# Patient Record
Sex: Male | Born: 1935
Health system: Southern US, Community
[De-identification: ages and names within clinical notes are randomized; demographics above are authoritative.]

## PROBLEM LIST (undated history)

## (undated) DIAGNOSIS — K2980 Duodenitis without bleeding: Secondary | ICD-10-CM

## (undated) DIAGNOSIS — N2581 Secondary hyperparathyroidism of renal origin: Secondary | ICD-10-CM

## (undated) DIAGNOSIS — N183 Chronic kidney disease, stage 3 unspecified: Secondary | ICD-10-CM

## (undated) DIAGNOSIS — N529 Male erectile dysfunction, unspecified: Secondary | ICD-10-CM

## (undated) DIAGNOSIS — N189 Chronic kidney disease, unspecified: Secondary | ICD-10-CM

## (undated) DIAGNOSIS — N35919 Unspecified urethral stricture, male, unspecified site: Secondary | ICD-10-CM

## (undated) DIAGNOSIS — M1A9XX0 Chronic gout, unspecified, without tophus (tophi): Secondary | ICD-10-CM

## (undated) DIAGNOSIS — K449 Diaphragmatic hernia without obstruction or gangrene: Secondary | ICD-10-CM

## (undated) DIAGNOSIS — L600 Ingrowing nail: Secondary | ICD-10-CM

## (undated) DIAGNOSIS — Z9989 Dependence on other enabling machines and devices: Secondary | ICD-10-CM

## (undated) DIAGNOSIS — G4730 Sleep apnea, unspecified: Secondary | ICD-10-CM

## (undated) DIAGNOSIS — K2990 Gastroduodenitis, unspecified, without bleeding: Secondary | ICD-10-CM

## (undated) DIAGNOSIS — I350 Nonrheumatic aortic (valve) stenosis: Secondary | ICD-10-CM

## (undated) DIAGNOSIS — M1990 Unspecified osteoarthritis, unspecified site: Secondary | ICD-10-CM

## (undated) DIAGNOSIS — Z906 Acquired absence of other parts of urinary tract: Secondary | ICD-10-CM

## (undated) DIAGNOSIS — E559 Vitamin D deficiency, unspecified: Secondary | ICD-10-CM

## (undated) DIAGNOSIS — K862 Cyst of pancreas: Secondary | ICD-10-CM

## (undated) DIAGNOSIS — E785 Hyperlipidemia, unspecified: Secondary | ICD-10-CM

## (undated) DIAGNOSIS — N489 Disorder of penis, unspecified: Secondary | ICD-10-CM

## (undated) DIAGNOSIS — G4733 Obstructive sleep apnea (adult) (pediatric): Secondary | ICD-10-CM

## (undated) DIAGNOSIS — E119 Type 2 diabetes mellitus without complications: Secondary | ICD-10-CM

## (undated) DIAGNOSIS — I10 Essential (primary) hypertension: Secondary | ICD-10-CM

## (undated) DIAGNOSIS — D490 Neoplasm of unspecified behavior of digestive system: Secondary | ICD-10-CM

## (undated) DIAGNOSIS — N401 Enlarged prostate with lower urinary tract symptoms: Secondary | ICD-10-CM

## (undated) DIAGNOSIS — Z872 Personal history of diseases of the skin and subcutaneous tissue: Secondary | ICD-10-CM

## (undated) DIAGNOSIS — Z974 Presence of external hearing-aid: Secondary | ICD-10-CM

## (undated) DIAGNOSIS — D631 Anemia in chronic kidney disease: Secondary | ICD-10-CM

## (undated) DIAGNOSIS — N184 Chronic kidney disease, stage 4 (severe): Secondary | ICD-10-CM

## (undated) DIAGNOSIS — N138 Other obstructive and reflux uropathy: Secondary | ICD-10-CM

## (undated) DIAGNOSIS — C801 Malignant (primary) neoplasm, unspecified: Secondary | ICD-10-CM

## (undated) HISTORY — DX: Sleep apnea, unspecified: G47.30

## (undated) HISTORY — DX: Ingrowing nail: L60.0

## (undated) HISTORY — DX: Essential (primary) hypertension: I10

## (undated) HISTORY — DX: Diaphragmatic hernia without obstruction or gangrene: K44.9

## (undated) HISTORY — DX: Unspecified osteoarthritis, unspecified site: M19.90

## (undated) HISTORY — DX: Cyst of pancreas: K86.2

## (undated) HISTORY — DX: Duodenitis without bleeding: K29.80

---

## 2003-06-11 ENCOUNTER — Encounter: Payer: Self-pay | Admitting: Internal Medicine

## 2005-10-20 ENCOUNTER — Ambulatory Visit: Payer: Self-pay | Admitting: Internal Medicine

## 2006-04-18 ENCOUNTER — Ambulatory Visit: Payer: Self-pay | Admitting: Internal Medicine

## 2006-05-02 ENCOUNTER — Ambulatory Visit: Payer: Self-pay | Admitting: Gastroenterology

## 2006-06-05 ENCOUNTER — Encounter: Payer: Self-pay | Admitting: Internal Medicine

## 2006-06-05 ENCOUNTER — Ambulatory Visit: Payer: Self-pay | Admitting: Gastroenterology

## 2006-10-31 ENCOUNTER — Ambulatory Visit: Payer: Self-pay | Admitting: Internal Medicine

## 2006-10-31 LAB — CONVERTED CEMR LAB
ALT: 30 U/L
AST: 25 U/L
Albumin: 3.8 g/dL
Alkaline Phosphatase: 64 U/L
BUN: 28 mg/dL — ABNORMAL HIGH
Bilirubin, Direct: 0.1 mg/dL
CO2: 25 meq/L
Calcium: 10 mg/dL
Chloride: 109 meq/L
Chol/HDL Ratio, serum: 4.4
Cholesterol: 188 mg/dL
Creatinine, Ser: 1.9 mg/dL — ABNORMAL HIGH
GFR calc non Af Amer: 37 mL/min
Glomerular Filtration Rate, Af Am: 45 mL/min/{1.73_m2}
Glucose, Bld: 119 mg/dL — ABNORMAL HIGH
HDL: 43.1 mg/dL
Hgb A1c MFr Bld: 6.2 % — ABNORMAL HIGH
LDL Cholesterol: 128 mg/dL — ABNORMAL HIGH
PSA: 0.61 ng/mL
Potassium: 4.8 meq/L
Sodium: 141 meq/L
Total Bilirubin: 1 mg/dL
Total Protein: 7.6 g/dL
Triglyceride fasting, serum: 84 mg/dL
VLDL: 17 mg/dL

## 2006-11-25 HISTORY — PX: OTHER SURGICAL HISTORY: SHX169

## 2006-12-04 ENCOUNTER — Encounter: Admission: RE | Admit: 2006-12-04 | Discharge: 2006-12-04 | Payer: Self-pay | Admitting: Urology

## 2006-12-05 ENCOUNTER — Ambulatory Visit (HOSPITAL_BASED_OUTPATIENT_CLINIC_OR_DEPARTMENT_OTHER): Admission: RE | Admit: 2006-12-05 | Discharge: 2006-12-05 | Payer: Self-pay | Admitting: Urology

## 2006-12-05 ENCOUNTER — Encounter (INDEPENDENT_AMBULATORY_CARE_PROVIDER_SITE_OTHER): Payer: Self-pay | Admitting: *Deleted

## 2006-12-05 HISTORY — PX: OTHER SURGICAL HISTORY: SHX169

## 2007-03-06 ENCOUNTER — Ambulatory Visit: Payer: Self-pay | Admitting: Internal Medicine

## 2007-03-06 LAB — CONVERTED CEMR LAB
ALT: 25 units/L (ref 0–40)
AST: 20 units/L (ref 0–37)
Albumin: 3.7 g/dL (ref 3.5–5.2)
Alkaline Phosphatase: 58 units/L (ref 39–117)
BUN: 27 mg/dL — ABNORMAL HIGH (ref 6–23)
Bilirubin, Direct: 0.1 mg/dL (ref 0.0–0.3)
CO2: 27 meq/L (ref 19–32)
Calcium: 9.6 mg/dL (ref 8.4–10.5)
Chloride: 108 meq/L (ref 96–112)
Cholesterol: 203 mg/dL (ref 0–200)
Creatinine, Ser: 1.8 mg/dL — ABNORMAL HIGH (ref 0.4–1.5)
Direct LDL: 134.9 mg/dL
GFR calc Af Amer: 48 mL/min
GFR calc non Af Amer: 40 mL/min
Glucose, Bld: 127 mg/dL — ABNORMAL HIGH (ref 70–99)
HDL: 50 mg/dL (ref >39.0)
Hgb A1c MFr Bld: 5.7 % (ref 4.6–6.0)
Potassium: 4.8 meq/L (ref 3.5–5.1)
Sodium: 140 meq/L (ref 135–145)
Total Bilirubin: 0.8 mg/dL (ref 0.3–1.2)
Total CHOL/HDL Ratio: 4.1
Total Protein: 7.2 g/dL (ref 6.0–8.3)
Triglycerides: 114 mg/dL (ref 0–149)
VLDL: 23 mg/dL (ref 0–40)

## 2007-03-19 ENCOUNTER — Ambulatory Visit: Payer: Self-pay | Admitting: Internal Medicine

## 2007-03-29 ENCOUNTER — Ambulatory Visit (HOSPITAL_BASED_OUTPATIENT_CLINIC_OR_DEPARTMENT_OTHER): Admission: RE | Admit: 2007-03-29 | Discharge: 2007-03-29 | Payer: Self-pay | Admitting: Urology

## 2007-03-29 ENCOUNTER — Encounter (INDEPENDENT_AMBULATORY_CARE_PROVIDER_SITE_OTHER): Payer: Self-pay | Admitting: Specialist

## 2007-03-29 HISTORY — PX: CYSTOSCOPY WITH BIOPSY: SHX5122

## 2007-05-01 ENCOUNTER — Encounter (INDEPENDENT_AMBULATORY_CARE_PROVIDER_SITE_OTHER): Payer: Self-pay | Admitting: Specialist

## 2007-05-01 ENCOUNTER — Ambulatory Visit (HOSPITAL_BASED_OUTPATIENT_CLINIC_OR_DEPARTMENT_OTHER): Admission: RE | Admit: 2007-05-01 | Discharge: 2007-05-01 | Payer: Self-pay | Admitting: Urology

## 2007-05-01 HISTORY — PX: URETHRECTOMY: SHX1080

## 2007-07-03 DIAGNOSIS — G4730 Sleep apnea, unspecified: Secondary | ICD-10-CM | POA: Insufficient documentation

## 2007-07-03 DIAGNOSIS — E1159 Type 2 diabetes mellitus with other circulatory complications: Secondary | ICD-10-CM

## 2007-07-03 DIAGNOSIS — I152 Hypertension secondary to endocrine disorders: Secondary | ICD-10-CM | POA: Insufficient documentation

## 2007-07-03 DIAGNOSIS — I10 Essential (primary) hypertension: Secondary | ICD-10-CM | POA: Insufficient documentation

## 2007-07-03 DIAGNOSIS — M1990 Unspecified osteoarthritis, unspecified site: Secondary | ICD-10-CM | POA: Insufficient documentation

## 2007-08-10 ENCOUNTER — Ambulatory Visit: Payer: Self-pay | Admitting: Internal Medicine

## 2007-08-27 ENCOUNTER — Ambulatory Visit: Payer: Self-pay | Admitting: Internal Medicine

## 2007-08-27 DIAGNOSIS — E1169 Type 2 diabetes mellitus with other specified complication: Secondary | ICD-10-CM | POA: Insufficient documentation

## 2007-08-27 DIAGNOSIS — E785 Hyperlipidemia, unspecified: Secondary | ICD-10-CM

## 2007-08-27 LAB — CONVERTED CEMR LAB
ALT: 23 units/L (ref 0–53)
AST: 20 units/L (ref 0–37)
Albumin: 3.6 g/dL (ref 3.5–5.2)
Alkaline Phosphatase: 60 units/L (ref 39–117)
BUN: 23 mg/dL (ref 6–23)
Basophils Absolute: 0 10*3/uL (ref 0.0–0.1)
Basophils Relative: 0.1 % (ref 0.0–1.0)
Bilirubin, Direct: 0.1 mg/dL (ref 0.0–0.3)
CO2: 26 meq/L (ref 19–32)
Calcium: 9.3 mg/dL (ref 8.4–10.5)
Chloride: 113 meq/L — ABNORMAL HIGH (ref 96–112)
Cholesterol: 194 mg/dL (ref 0–200)
Creatinine, Ser: 1.8 mg/dL — ABNORMAL HIGH (ref 0.4–1.5)
Eosinophils Absolute: 0.2 10*3/uL (ref 0.0–0.6)
Eosinophils Relative: 3 % (ref 0.0–5.0)
GFR calc Af Amer: 48 mL/min
GFR calc non Af Amer: 40 mL/min
Glucose, Bld: 141 mg/dL — ABNORMAL HIGH (ref 70–99)
HCT: 44 % (ref 39.0–52.0)
HDL: 42.8 mg/dL (ref >39.0)
Hemoglobin: 15.1 g/dL (ref 13.0–17.0)
Hgb A1c MFr Bld: 6.2 % — ABNORMAL HIGH (ref 4.6–6.0)
LDL Cholesterol: 125 mg/dL — ABNORMAL HIGH (ref 0–99)
Lymphocytes Relative: 28.1 % (ref 12.0–46.0)
MCHC: 34.3 g/dL (ref 30.0–36.0)
MCV: 91.1 fL (ref 78.0–100.0)
Monocytes Absolute: 0.6 10*3/uL (ref 0.2–0.7)
Monocytes Relative: 10.6 % (ref 3.0–11.0)
Neutro Abs: 2.9 10*3/uL (ref 1.4–7.7)
Neutrophils Relative %: 58.2 % (ref 43.0–77.0)
Platelets: 204 10*3/uL (ref 150–400)
Potassium: 4.6 meq/L (ref 3.5–5.1)
RBC: 4.83 M/uL (ref 4.22–5.81)
RDW: 13.1 % (ref 11.5–14.6)
Sodium: 144 meq/L (ref 135–145)
Total Bilirubin: 0.9 mg/dL (ref 0.3–1.2)
Total CHOL/HDL Ratio: 4.5
Total Protein: 6.6 g/dL (ref 6.0–8.3)
Triglycerides: 133 mg/dL (ref 0–149)
VLDL: 27 mg/dL (ref 0–40)
WBC: 5.2 10*3/uL (ref 4.5–10.5)

## 2007-09-03 ENCOUNTER — Ambulatory Visit: Payer: Self-pay | Admitting: Internal Medicine

## 2007-10-23 ENCOUNTER — Telehealth: Payer: Self-pay | Admitting: Internal Medicine

## 2007-11-02 ENCOUNTER — Ambulatory Visit: Payer: Self-pay | Admitting: Internal Medicine

## 2007-11-02 LAB — CONVERTED CEMR LAB
Glucose, Urine, Semiquant: NEGATIVE
Ketones, urine, test strip: NEGATIVE
Nitrite: NEGATIVE
Specific Gravity, Urine: >=1.03
Urobilinogen, UA: 1
WBC Urine, dipstick: NEGATIVE
pH: 5.5

## 2007-11-03 ENCOUNTER — Encounter: Payer: Self-pay | Admitting: Internal Medicine

## 2007-12-18 ENCOUNTER — Ambulatory Visit: Payer: Self-pay | Admitting: Family Medicine

## 2007-12-20 HISTORY — PX: CATARACT EXTRACTION W/ INTRAOCULAR LENS IMPLANT: SHX1309

## 2008-03-12 ENCOUNTER — Ambulatory Visit: Payer: Self-pay | Admitting: Internal Medicine

## 2008-03-12 LAB — CONVERTED CEMR LAB
ALT: 24 units/L (ref 0–53)
Albumin: 3.7 g/dL (ref 3.5–5.2)
Alkaline Phosphatase: 62 units/L (ref 39–117)
BUN: 27 mg/dL — ABNORMAL HIGH (ref 6–23)
Basophils Absolute: 0.1 10*3/uL (ref 0.0–0.1)
Basophils Relative: 0.9 % (ref 0.0–1.0)
Bilirubin Urine: NEGATIVE
Bilirubin, Direct: 0.2 mg/dL (ref 0.0–0.3)
CO2: 28 meq/L (ref 19–32)
Cholesterol: 187 mg/dL (ref 0–200)
Eosinophils Absolute: 0.2 10*3/uL (ref 0.0–0.6)
GFR calc Af Amer: 48 mL/min
Glucose, Bld: 122 mg/dL — ABNORMAL HIGH (ref 70–99)
HDL: 43.6 mg/dL (ref >39.0)
Ketones, urine, test strip: NEGATIVE
LDL Cholesterol: 127 mg/dL — ABNORMAL HIGH (ref 0–99)
Lymphocytes Relative: 20.2 % (ref 12.0–46.0)
MCHC: 33.4 g/dL (ref 30.0–36.0)
MCV: 90.8 fL (ref 78.0–100.0)
Neutrophils Relative %: 66.1 % (ref 43.0–77.0)
Platelets: 199 10*3/uL (ref 150–400)
Potassium: 5.4 meq/L — ABNORMAL HIGH (ref 3.5–5.1)
RBC: 4.93 M/uL (ref 4.22–5.81)
Sodium: 142 meq/L (ref 135–145)
Total Protein: 6.8 g/dL (ref 6.0–8.3)
Urobilinogen, UA: 0.2
VLDL: 16 mg/dL (ref 0–40)
WBC: 8.1 10*3/uL (ref 4.5–10.5)

## 2008-03-24 ENCOUNTER — Ambulatory Visit: Payer: Self-pay | Admitting: Internal Medicine

## 2008-03-24 DIAGNOSIS — N184 Chronic kidney disease, stage 4 (severe): Secondary | ICD-10-CM | POA: Insufficient documentation

## 2008-06-17 ENCOUNTER — Telehealth (INDEPENDENT_AMBULATORY_CARE_PROVIDER_SITE_OTHER): Payer: Self-pay | Admitting: *Deleted

## 2008-06-23 ENCOUNTER — Telehealth (INDEPENDENT_AMBULATORY_CARE_PROVIDER_SITE_OTHER): Payer: Self-pay | Admitting: *Deleted

## 2008-06-24 ENCOUNTER — Telehealth (INDEPENDENT_AMBULATORY_CARE_PROVIDER_SITE_OTHER): Payer: Self-pay | Admitting: *Deleted

## 2008-07-21 ENCOUNTER — Ambulatory Visit: Payer: Self-pay | Admitting: Internal Medicine

## 2008-07-21 LAB — CONVERTED CEMR LAB
Albumin: 3.8 g/dL (ref 3.5–5.2)
BUN: 27 mg/dL — ABNORMAL HIGH (ref 6–23)
Bilirubin, Direct: 0.1 mg/dL (ref 0.0–0.3)
Chloride: 110 meq/L (ref 96–112)
Cholesterol: 178 mg/dL (ref 0–200)
Creatinine, Ser: 1.7 mg/dL — ABNORMAL HIGH (ref 0.4–1.5)
Hgb A1c MFr Bld: 6.5 % — ABNORMAL HIGH (ref 4.6–6.0)
LDL Cholesterol: 110 mg/dL — ABNORMAL HIGH (ref 0–99)
Total CHOL/HDL Ratio: 3.9
Total Protein: 7.2 g/dL (ref 6.0–8.3)
Triglycerides: 110 mg/dL (ref 0–149)
VLDL: 22 mg/dL (ref 0–40)

## 2008-07-22 ENCOUNTER — Ambulatory Visit (HOSPITAL_BASED_OUTPATIENT_CLINIC_OR_DEPARTMENT_OTHER): Admission: RE | Admit: 2008-07-22 | Discharge: 2008-07-22 | Payer: Self-pay | Admitting: Internal Medicine

## 2008-07-22 ENCOUNTER — Encounter: Payer: Self-pay | Admitting: Internal Medicine

## 2008-07-29 ENCOUNTER — Ambulatory Visit: Payer: Self-pay | Admitting: Pulmonary Disease

## 2008-08-05 ENCOUNTER — Ambulatory Visit: Payer: Self-pay | Admitting: Internal Medicine

## 2008-08-13 ENCOUNTER — Encounter: Payer: Self-pay | Admitting: Internal Medicine

## 2008-08-14 LAB — CONVERTED CEMR LAB: PSA: 0.94 ng/mL

## 2008-08-19 ENCOUNTER — Ambulatory Visit (HOSPITAL_BASED_OUTPATIENT_CLINIC_OR_DEPARTMENT_OTHER): Admission: RE | Admit: 2008-08-19 | Discharge: 2008-08-19 | Payer: Self-pay | Admitting: Internal Medicine

## 2008-08-19 ENCOUNTER — Encounter: Payer: Self-pay | Admitting: Internal Medicine

## 2008-08-20 ENCOUNTER — Ambulatory Visit: Payer: Self-pay | Admitting: Pulmonary Disease

## 2008-09-17 ENCOUNTER — Encounter: Payer: Self-pay | Admitting: Internal Medicine

## 2008-09-29 ENCOUNTER — Ambulatory Visit: Payer: Self-pay | Admitting: Internal Medicine

## 2008-12-19 LAB — HM COLONOSCOPY

## 2008-12-25 ENCOUNTER — Ambulatory Visit: Payer: Self-pay | Admitting: Internal Medicine

## 2009-01-20 ENCOUNTER — Ambulatory Visit: Payer: Self-pay | Admitting: Internal Medicine

## 2009-01-20 LAB — CONVERTED CEMR LAB
Albumin: 3.7 g/dL (ref 3.5–5.2)
BUN: 22 mg/dL (ref 6–23)
CO2: 29 meq/L (ref 19–32)
Calcium: 9.6 mg/dL (ref 8.4–10.5)
Cholesterol: 195 mg/dL (ref 0–200)
Creatinine, Ser: 2 mg/dL — ABNORMAL HIGH (ref 0.4–1.5)
GFR calc Af Amer: 42 mL/min
Glucose, Bld: 147 mg/dL — ABNORMAL HIGH (ref 70–99)
HDL: 47.3 mg/dL (ref >39.0)
Sodium: 144 meq/L (ref 135–145)
Total Protein: 7.2 g/dL (ref 6.0–8.3)
Triglycerides: 92 mg/dL (ref 0–149)
VLDL: 18 mg/dL (ref 0–40)

## 2009-01-26 ENCOUNTER — Ambulatory Visit: Payer: Self-pay | Admitting: Internal Medicine

## 2009-02-02 ENCOUNTER — Ambulatory Visit: Payer: Self-pay | Admitting: Internal Medicine

## 2009-04-20 ENCOUNTER — Ambulatory Visit: Payer: Self-pay | Admitting: Internal Medicine

## 2009-04-20 LAB — CONVERTED CEMR LAB
AST: 26 units/L (ref 0–37)
Alkaline Phosphatase: 61 units/L (ref 39–117)
BUN: 20 mg/dL (ref 6–23)
Creatinine, Ser: 1.7 mg/dL — ABNORMAL HIGH (ref 0.4–1.5)
GFR calc non Af Amer: 42.26 mL/min (ref >60)
Hgb A1c MFr Bld: 6.4 % (ref 4.6–6.5)
Total Bilirubin: 0.8 mg/dL (ref 0.3–1.2)
Total CHOL/HDL Ratio: 4

## 2009-04-27 ENCOUNTER — Ambulatory Visit: Payer: Self-pay | Admitting: Internal Medicine

## 2009-10-27 ENCOUNTER — Ambulatory Visit: Payer: Self-pay | Admitting: Internal Medicine

## 2009-10-27 LAB — CONVERTED CEMR LAB
Albumin: 3.9 g/dL (ref 3.5–5.2)
Alkaline Phosphatase: 60 units/L (ref 39–117)
Bilirubin, Direct: 0.1 mg/dL (ref 0.0–0.3)
CO2: 28 meq/L (ref 19–32)
Chloride: 107 meq/L (ref 96–112)
Creatinine, Ser: 1.9 mg/dL — ABNORMAL HIGH (ref 0.4–1.5)
HDL: 45.2 mg/dL (ref >39.00)
LDL Cholesterol: 123 mg/dL — ABNORMAL HIGH (ref 0–99)
Total Bilirubin: 1 mg/dL (ref 0.3–1.2)
Total CHOL/HDL Ratio: 4
Triglycerides: 109 mg/dL (ref 0.0–149.0)
VLDL: 21.8 mg/dL (ref 0.0–40.0)

## 2009-11-04 ENCOUNTER — Ambulatory Visit: Payer: Self-pay | Admitting: Internal Medicine

## 2009-11-04 DIAGNOSIS — C680 Malignant neoplasm of urethra: Secondary | ICD-10-CM | POA: Insufficient documentation

## 2009-11-04 LAB — CONVERTED CEMR LAB
Glucose, Urine, Semiquant: NEGATIVE
Protein, U semiquant: NEGATIVE
Specific Gravity, Urine: 1.015

## 2009-12-02 ENCOUNTER — Encounter: Payer: Self-pay | Admitting: Internal Medicine

## 2009-12-21 ENCOUNTER — Telehealth: Payer: Self-pay | Admitting: Internal Medicine

## 2010-02-23 ENCOUNTER — Ambulatory Visit: Payer: Self-pay | Admitting: Internal Medicine

## 2010-02-23 LAB — CONVERTED CEMR LAB
ALT: 25 units/L (ref 0–53)
AST: 22 units/L (ref 0–37)
Alkaline Phosphatase: 63 units/L (ref 39–117)
BUN: 32 mg/dL — ABNORMAL HIGH (ref 6–23)
Bilirubin, Direct: 0 mg/dL (ref 0.0–0.3)
Chloride: 113 meq/L — ABNORMAL HIGH (ref 96–112)
Cholesterol: 180 mg/dL (ref 0–200)
GFR calc non Af Amer: 34.95 mL/min (ref >60)
Hgb A1c MFr Bld: 6.2 % (ref 4.6–6.5)
LDL Cholesterol: 109 mg/dL — ABNORMAL HIGH (ref 0–99)
Potassium: 4.7 meq/L (ref 3.5–5.1)
Sodium: 144 meq/L (ref 135–145)
Total Bilirubin: 0.5 mg/dL (ref 0.3–1.2)
VLDL: 17 mg/dL (ref 0.0–40.0)

## 2010-03-09 ENCOUNTER — Ambulatory Visit: Payer: Self-pay | Admitting: Internal Medicine

## 2010-03-12 ENCOUNTER — Encounter: Payer: Self-pay | Admitting: Internal Medicine

## 2010-09-10 ENCOUNTER — Encounter: Payer: Self-pay | Admitting: Internal Medicine

## 2010-09-10 ENCOUNTER — Ambulatory Visit: Payer: Self-pay | Admitting: Internal Medicine

## 2010-09-15 LAB — CONVERTED CEMR LAB
Albumin: 4.1 g/dL (ref 3.5–5.2)
Basophils Absolute: 0 10*3/uL (ref 0.0–0.1)
CO2: 23 meq/L (ref 19–32)
Calcium: 10 mg/dL (ref 8.4–10.5)
Cholesterol: 204 mg/dL — ABNORMAL HIGH (ref 0–200)
Eosinophils Absolute: 0.1 10*3/uL (ref 0.0–0.7)
HDL: 44 mg/dL (ref >39.00)
Hemoglobin: 15.8 g/dL (ref 13.0–17.0)
Lymphocytes Relative: 31 % (ref 12.0–46.0)
MCHC: 34.1 g/dL (ref 30.0–36.0)
Neutrophils Relative %: 55.7 % (ref 43.0–77.0)
RBC: 5.03 M/uL (ref 4.22–5.81)
RDW: 14.2 % (ref 11.5–14.6)
Sodium: 141 meq/L (ref 135–145)
Total Bilirubin: 0.8 mg/dL (ref 0.3–1.2)
Total Protein: 7.5 g/dL (ref 6.0–8.3)
Triglycerides: 112 mg/dL (ref 0.0–149.0)

## 2010-09-20 ENCOUNTER — Ambulatory Visit: Payer: Self-pay | Admitting: Internal Medicine

## 2010-09-27 LAB — CONVERTED CEMR LAB
BUN: 27 mg/dL — ABNORMAL HIGH (ref 6–23)
CO2: 26 meq/L (ref 19–32)
Calcium: 9.5 mg/dL (ref 8.4–10.5)
GFR calc non Af Amer: 38.91 mL/min (ref >60)
Glucose, Bld: 130 mg/dL — ABNORMAL HIGH (ref 70–99)
Potassium: 5.5 meq/L — ABNORMAL HIGH (ref 3.5–5.1)

## 2010-11-01 ENCOUNTER — Ambulatory Visit: Payer: Self-pay | Admitting: Internal Medicine

## 2010-11-08 LAB — CONVERTED CEMR LAB
Calcium: 9.7 mg/dL (ref 8.4–10.5)
Chloride: 103 meq/L (ref 96–112)
GFR calc non Af Amer: 37.46 mL/min (ref >60)
Glucose, Bld: 130 mg/dL — ABNORMAL HIGH (ref 70–99)
Potassium: 5.3 meq/L — ABNORMAL HIGH (ref 3.5–5.1)

## 2010-12-07 ENCOUNTER — Ambulatory Visit: Payer: Self-pay | Admitting: Internal Medicine

## 2010-12-09 LAB — CONVERTED CEMR LAB
CO2: 25 meq/L (ref 19–32)
Chloride: 108 meq/L (ref 96–112)
Sodium: 142 meq/L (ref 135–145)

## 2010-12-21 ENCOUNTER — Telehealth: Payer: Self-pay | Admitting: Internal Medicine

## 2011-01-18 NOTE — Progress Notes (Signed)
Summary: refills  Phone Note Call from Patient Call back at Home Phone [phone]   Caller: Patient via voice mail Call For: Phoebe Sharps MD Summary of Call: Needs Rx simvastatin and enalapril sent to [facility] mail order.  Fax is [phone] ID [id number]  Please call pt to verify. Initial call taken by: Rica Records, RN,  December 21, 2009 1:34 PM  Follow-up for Phone Call        Rx done via fax to [facility] mail order.  Left message on home phone to notify pt. Follow-up by: Rica Records, RN,  December 21, 2009 1:42 PM    Prescriptions: SIMVASTATIN 40 MG TABS (SIMVASTATIN) once daily  #90 x 3   Entered by:   Rica Records, RN   Authorized by:   Phoebe Sharps MD   Signed by:   Rica Records, RN on 12/21/2009   Method used:   Printed then faxed to ...       [facility] (retail)       [address]       [place]  [address]       Ph: [phone]       Fax: [phone]   RxID:   [id number] ENALAPRIL MALEATE 10 MG TABS (ENALAPRIL MALEATE) Take 1 tablet by mouth twice a day  #180 x 3   Entered by:   Rica Records, RN   Authorized by:   Phoebe Sharps MD   Signed by:   Rica Records, RN on 12/21/2009   Method used:   Printed then faxed to ...       [facility] (retail)       [address], [place]  [address]       Ph: [phone]       Fax: [phone]   RxID:   [id number]  FAXED TO [place]  [phone]

## 2011-01-18 NOTE — Assessment & Plan Note (Signed)
Summary: 4 month fup//ccm pt rsc/njr   Vital Signs:  Patient profile:   77 year old male Weight:      285 pounds Temp:     98.7 degrees F oral Pulse rate:   80 / minute Pulse rhythm:   regular BP sitting:   118 / 74  (left arm) Cuff size:   large  Vitals Entered By: Emelia Salisbury LPN (November 14, 624THL 10:55 AM) CC: 4 month follow-up   CC:  4 month follow-up.  History of Present Illness:  Follow-Up Visit      This is a 77 year old man who presents for Follow-up visit.  The patient denies chest pain and palpitations.  Since the last visit the patient notes no new problems or concerns: note hyperkalemia.  The patient reports taking meds as prescribed.  When questioned about possible medication side effects, the patient notes none.  patient's potassium has been elevated. He reports started on enalapril for renal insufficiency years ago. His creatinine has been stable in the 1.7-2 range for many years. With his elevated potassium and its.his enalapril and start amlodipine.  All other systems reviewed and were negative   Allergies: No Known Drug Allergies  Past History:  Past Medical History: Last updated: 09/03/2007 Hypertension Osteoarthritis penile and urethral sqamous cell CA--  Past Surgical History: Last updated: 09/03/2007 surgery for uretheral squamous cell CA---distal uretherectomy  Family History: Last updated: 09/03/2007 Family History of Cardiovascular disorder Fam hx Stroke father with CHF mother deceased age 53  Social History: Last updated: 07/03/2007 Married Former Smoker Alcohol use-no Drug use-no Regular exercise-no Retired  Risk Factors: Exercise: no (09/10/2010)  Risk Factors: Smoking Status: quit > 6 months (09/10/2010)   Impression & Recommendations:  Problem # 1:  RENAL INSUFFICIENCY (ICD-588.9) continue amlodipine. We'll followup labs. Likely see me 6 months. Orders: TLB-BMP (Basic Metabolic Panel-BMET) (99991111)  Complete  Medication List: 1)  Simvastatin 40 Mg Tabs (Simvastatin) .... Once daily 2)  Aspirin 81 Mg Tbec (Aspirin) .... One by mouth every day 3)  Amlodipine Besylate 5 Mg Tabs (Amlodipine besylate) .... One daily. 4)  Levitra 20 Mg Tabs (Vardenafil hcl) .... Take one by mouth as needed  Other Orders: Tdap => 32yrs IM [id number]) Admin 1st Vaccine [id number]) Venipuncture HR:[id number])   Orders Added: 1)  Tdap => 59yrs IM [id number] 2)  Admin 1st Vaccine [90471] 3)  Venipuncture [id number] 4)  TLB-BMP (Basic Metabolic Panel-BMET) 123456 5)  Est. Patient Level III [id number]   Immunizations Administered:  Tetanus Vaccine:    Vaccine Type: Tdap    Site: left deltoid    Mfr: GlaxoSmithKline    Dose: 0.5 ml    Route: IM    Given by: Emelia Salisbury LPN    Exp. Date: 10/07/2012    Lot #: [id number]    VIS given: 11/05/08 version given November 01, 2010.   Immunizations Administered:  Tetanus Vaccine:    Vaccine Type: Tdap    Site: left deltoid    Mfr: GlaxoSmithKline    Dose: 0.5 ml    Route: IM    Given by: Emelia Salisbury LPN    Exp. Date: 10/07/2012    Lot #: [id number]    VIS given: 11/05/08 version given November 01, 2010.  Current Allergies (reviewed today): No known allergies     Appended Document: Orders Update     Clinical Lists Changes  Orders: Added new Service order of Specimen Handling (99000) - Signed

## 2011-01-18 NOTE — Assessment & Plan Note (Signed)
Summary: 4 MONTH ROV/NJR rsc bmp/njr   Vital Signs:  Patient profile:   77 year old male Height:      73.25 inches Weight:      285 pounds BMI:     37.48 Temp:     98.3 degrees F oral BP sitting:   102 / 68  (left arm) Cuff size:   regular  Vitals Entered By: Westley Hummer CMA Deborra Medina) (March 09, 2010 10:58 AM)  Nutrition Counseling: Patient's BMI is greater than 25 and therefore counseled on weight management options. CC: follow-up visit Is Patient Diabetic? No Pain Assessment Patient in pain? no        CC:  follow-up visit.  History of Present Illness:  Follow-Up Visit      This is a 77 year old man who presents for Follow-up visit.  The patient denies chest pain and palpitations.  Since the last visit the patient notes no new problems or concerns.  The patient reports taking meds as prescribed.  When questioned about possible medication side effects, the patient notes none.   has lost some weight (eating less)  All other systems reviewed and were negative   Allergies: No Known Drug Allergies  Physical Exam  General:  Well-developed,well-nourished,in no acute distress; alert,appropriate and cooperative throughout examination Head:  normocephalic and atraumatic.   Eyes:  pupils equal and pupils round.   Ears:  R ear normal and L ear normal.   Neck:  No deformities, masses, or tenderness noted. Heart:  normal rate and regular rhythm.   Abdomen:  Bowel sounds positive,abdomen soft and non-tender without masses, organomegaly or hernias noted.  obese Msk:  No deformity or scoliosis noted of thoracic or lumbar spine.   Neurologic:  cranial nerves II-XII intact and gait normal.     Impression & Recommendations:  Problem # 1:  HYPERLIPIDEMIA (ICD-272.4) well controlled continue current medications  His updated medication list for this problem includes:    Simvastatin 40 Mg Tabs (Simvastatin) ..... Once daily  Labs Reviewed: SGOT: 22 (02/23/2010)   SGPT: 25  (02/23/2010)   HDL:53.90 (02/23/2010), 45.20 (10/27/2009)  LDL:109 (02/23/2010), 123 (10/27/2009)  Chol:180 (02/23/2010), 190 (10/27/2009)  Trig:85.0 (02/23/2010), 109.0 (10/27/2009)  Problem # 2:  HYPERTENSION (ICD-401.9) controlled continue current medications  His updated medication list for this problem includes:    Enalapril Maleate 10 Mg Tabs (Enalapril maleate) .Marland Kitchen... Take 1 tablet by mouth twice a day  BP today: 102/68 Prior BP: 106/60 (11/04/2009)  Labs Reviewed: K+: 4.7 (02/23/2010) Creat: : 2.0 (02/23/2010)   Chol: 180 (02/23/2010)   HDL: 53.90 (02/23/2010)   LDL: 109 (02/23/2010)   TG: 85.0 (02/23/2010)  Problem # 3:  RENAL INSUFFICIENCY (ICD-588.9) stable  Problem # 4:  SLEEP APNEA (ICD-780.57) will improve with weight loss  Complete Medication List: 1)  Enalapril Maleate 10 Mg Tabs (Enalapril maleate) .... Take 1 tablet by mouth twice a day 2)  Simvastatin 40 Mg Tabs (Simvastatin) .... Once daily 3)  Aspirin 81 Mg Tbec (Aspirin) .... One by mouth every day  Patient Instructions: 1)  Please schedule a follow-up appointment in 6 months. Medicare wellness visit

## 2011-01-18 NOTE — Letter (Signed)
Summary: [facility]  [facility]   Imported By: Laural Benes 03/23/2010 13:24:29  _____________________________________________________________________  External Attachment:    Type:   Image     Comment:   External Document

## 2011-01-18 NOTE — Assessment & Plan Note (Signed)
Summary: medicare wellness visit/njr/pt rescd from bump//ccm   Vital Signs:  Patient profile:   77 year old male Weight:      281 pounds Temp:     99.2 degrees F oral Pulse rate:   88 / minute BP sitting:   100 / 70  (right arm) Cuff size:   regular  Vitals Entered By: Sherron Monday, CMA (AAMA) (September 10, 2010 10:08 AM) CC: Annual visit for disease managment   CC:  Annual visit for disease managment.  History of Present Illness: Here for Medicare AWV:  1.   Risk factors based on Past M, S, F history:--see list 2.   Physical Activities: --able to do all ADLs 3.   Depression/mood: -no concerns 4.   Hearing: -no concerns 5.   ADL's: --able to do all 6.   Fall Risk: ---no concerns 7.   Home Safety: ---no concerns 8.   Height, weight, &visual acuity:see exam, annual ophthal exam 9.   Counseling: --advised weight loss, daily exercise 10.   Labs ordered based on risk factors: --see orders 11.           Referral Coordination---none necessary 12.           Care Plan---advised daily exercise and low calorie diet 13.            Cognitive Assessment : pt is alert and oriented, neuro system grossly intact.   Current Problems:  RENAL INSUFFICIENCY (ICD-588.9): no swelling, tolerating meds HYPERLIPIDEMIA (ICD-272.4): tolerating meds SLEEP APNEA (ICD-780.57) HYPERTENSION (ICD-401.9): tolerating meds without difficult    Preventive Screening-Counseling & Management  Alcohol-Tobacco     Smoking Status: quit > 6 months     Year Started: 1955     Year Quit: 1980  Caffeine-Diet-Exercise     Does Patient Exercise: no  Current Problems (verified): 1)  Health Screening  (ICD-V70.0) 2)  Carcinoma Situ Other&unspecified Urinary Organs  (ICD-233.9) 3)  Renal Insufficiency  (ICD-588.9) 4)  Hyperlipidemia  (ICD-272.4) 5)  Disease, Acute Cerebrovascular, Ill-defined  (ICD-436) 6)  Sleep Apnea  (ICD-780.57) 7)  Osteoarthritis  (ICD-715.90) 8)  Hypertension   (ICD-401.9)  Current Medications (verified): 1)  Enalapril Maleate 10 Mg Tabs (Enalapril Maleate) .... Take 1 Tablet By Mouth Twice A Day 2)  Simvastatin 40 Mg Tabs (Simvastatin) .... Once Daily 3)  Aspirin 81 Mg  Tbec (Aspirin) .... One By Mouth Every Day  Allergies (verified): No Known Drug Allergies  Past History:  Past Medical History: Last updated: 09/03/2007 Hypertension Osteoarthritis penile and urethral sqamous cell CA--  Past Surgical History: Last updated: 09/03/2007 surgery for uretheral squamous cell CA---distal uretherectomy  Family History: Last updated: 09/03/2007 Family History of Cardiovascular disorder Fam hx Stroke father with CHF mother deceased age 64  Social History: Last updated: 07/03/2007 Married Former Smoker Alcohol use-no Drug use-no Regular exercise-no Retired  Risk Factors: Exercise: no (09/10/2010)  Risk Factors: Smoking Status: quit > 6 months (09/10/2010)  Physical Exam  General:  alert and well-developed.   Head:  normocephalic and atraumatic.   Eyes:  pupils equal and pupils round.   Ears:  R ear normal and L ear normal.   Neck:  No deformities, masses, or tenderness noted. Chest Wall:  No deformities, masses, tenderness or gynecomastia noted. Lungs:  normal respiratory effort and no intercostal retractions.   Heart:  normal rate and regular rhythm.   Abdomen:  soft and non-tender.   Prostate:  Dr Jeffie Pollock Msk:  No deformity or scoliosis noted of thoracic  or lumbar spine.   Neurologic:  cranial nerves II-XII intact and gait normal.     Impression & Recommendations:  Problem # 1:  RENAL INSUFFICIENCY (ICD-588.9)  check labs  Orders: Venipuncture [id number]) Specimen Handling (99000) TLB-BMP (Basic Metabolic Panel-BMET) (99991111) TLB-CBC Platelet - w/Differential (85025-CBCD)  Problem # 2:  HYPERLIPIDEMIA (ICD-272.4)  His updated medication list for this problem includes:    Simvastatin 40 Mg Tabs (Simvastatin)  ..... Once daily  Orders: Specimen Handling (99000) TLB-Lipid Panel (80061-LIPID) TLB-Hepatic/Liver Function Pnl (80076-HEPATIC) TLB-TSH (Thyroid Stimulating Hormone) (84443-TSH) TLB-CBC Platelet - w/Differential (85025-CBCD)  Problem # 3:  HYPERTENSION (ICD-401.9)  His updated medication list for this problem includes:    Enalapril Maleate 10 Mg Tabs (Enalapril maleate) .Marland Kitchen... Take 1 tablet by mouth twice a day  Orders: EKG w/ Interpretation (93000) Specimen Handling (99000) TLB-CBC Platelet - w/Differential (85025-CBCD)  Complete Medication List: 1)  Enalapril Maleate 10 Mg Tabs (Enalapril maleate) .... Take 1 tablet by mouth twice a day 2)  Simvastatin 40 Mg Tabs (Simvastatin) .... Once daily 3)  Aspirin 81 Mg Tbec (Aspirin) .... One by mouth every day  Other Orders: Admin 1st Vaccine [phone]) Flu Vaccine 70yrs + [phone]) Medicare -1st Annual Wellness Visit [phone]) Flu Vaccine Consent Questions     Do you have a history of severe allergic reactions to this vaccine? no    Any prior history of allergic reactions to egg and/or gelatin? no    Do you have a sensitivity to the preservative Thimersol? no    Do you have a past history of Guillan-Barre Syndrome? no    Do you currently have an acute febrile illness? no    Have you ever had a severe reaction to latex? no    Vaccine information given and explained to patient? yes    Are you currently pregnant? no    Lot Number:[id number]   Exp Date:06/18/2011   Site Given  Left Deltoid IM Sherron Monday, CMA (AAMA)  September 10, 2010 10:09 AM  Flu Vaccine 71yrs + [phone])    .lbflu   Preventive Care Screening  Last Flu Shot:    Date:  09/10/2010    Results:  Fluvax 3+  Prior Values:    PSA:  0.94 (08/14/2008)    Colonoscopy:  Normal--pt's report (12/20/2003)    Last Flu Shot:  Historical (09/30/2009)    Last Pneumovax:  Historical (09/18/2004)   Physical Exam General Appearance: well developed, well nourished, no  acute distress Eyes: conjunctiva and lids normal, PERRL, EOMI,  Ears, Nose, Mouth, Throat: TM clear, nares clear, oral exam WNL Neck: supple, no lymphadenopathy, no thyromegaly, no JVD Respiratory: clear to auscultation and percussion, respiratory effort normal Cardiovascular: regular rate and rhythm, S1-S2, no murmur, rub or gallop, no bruits, peripheral pulses normal and symmetric, no cyanosis, clubbing, edema or varicosities Chest: no scars, masses, tenderness; no asymmetry, skin changes, nipple discharge, no gynecomastia   Gastrointestinal: soft, non-tender; no hepatosplenomegaly, masses; active bowel sounds all quadrants,  Lymphatic: no cervical, axillary or inguinal adenopathy Musculoskeletal: gait normal, muscle tone and strength WNL, no joint swelling, effusions, discoloration, crepitus  Skin: clear, good turgor, color WNL, no rashes, lesions, or ulcerations Neurologic: normal mental status, normal reflexes, normal strength, sensation, and motion Psychiatric: alert; oriented to person, place and time Other Exam:

## 2011-01-20 NOTE — Progress Notes (Signed)
Summary: refill  Phone Note Refill Request Call back at Home Phone [phone] Message from:  Patient---live call on *****new pharmacy*****  Refills Requested: Medication #1:  SIMVASTATIN 40 MG TABS once daily need 90 day supply with refills. send [facility] on [facility] garden.  Initial call taken by: Despina Arias,  December 21, 2010 8:50 AM  Follow-up for Phone Call        Rx called to pharmacy Follow-up by: Townsend Roger, [place],  December 21, 2010 9:09 AM    Prescriptions: SIMVASTATIN 40 MG TABS (SIMVASTATIN) once daily  #90 x 3   Entered by:   Townsend Roger, [place] by:   Phoebe Sharps MD   Signed by:   Townsend Roger, CMA on 12/21/2010   Method used:   Electronically to        [facility] [address] (retail)       [place], [place]  [address]       Ph: [phone]       Fax: [phone]   RxID:   [id number]

## 2011-03-03 ENCOUNTER — Encounter: Payer: Self-pay | Admitting: Internal Medicine

## 2011-03-08 ENCOUNTER — Ambulatory Visit: Payer: Self-pay | Admitting: Internal Medicine

## 2011-03-17 ENCOUNTER — Encounter: Payer: Self-pay | Admitting: Internal Medicine

## 2011-03-17 ENCOUNTER — Ambulatory Visit (INDEPENDENT_AMBULATORY_CARE_PROVIDER_SITE_OTHER): Payer: Medicare Other | Admitting: Internal Medicine

## 2011-03-17 VITALS — BP 110/70 | Temp 98.3°F | Wt 286.0 lb

## 2011-03-17 DIAGNOSIS — L02419 Cutaneous abscess of limb, unspecified: Secondary | ICD-10-CM

## 2011-03-17 DIAGNOSIS — M1990 Unspecified osteoarthritis, unspecified site: Secondary | ICD-10-CM

## 2011-03-17 DIAGNOSIS — L03116 Cellulitis of left lower limb: Secondary | ICD-10-CM

## 2011-03-17 MED ORDER — AMOXICILLIN-POT CLAVULANATE 875-125 MG PO TABS: 1.0000 | ORAL_TABLET | Freq: Two times a day (BID) | ORAL | Status: AC

## 2011-03-17 MED ORDER — HYDROCODONE-ACETAMINOPHEN 5-500 MG PO TABS: 1.0000 | ORAL_TABLET | ORAL | Status: DC | PRN

## 2011-03-17 NOTE — Progress Notes (Signed)
  Subjective:    Patient ID: Charles Castillo, male    DOB: [birth year], 77 y.o.   MRN: [id number]  HPI 77 year old patient who has a history of osteoarthritis but no prior history of gout. He presents with a two-day history of increasing atraumatic left lateral knee pain. There's been no fever or systemic complaints. He has treated hypertension and dyslipidemia which have been stable   Review of Systems  Constitutional: Negative for fever, chills, appetite change and fatigue.  HENT: Negative for hearing loss, ear pain, congestion, sore throat, trouble swallowing, neck stiffness, dental problem, voice change and tinnitus.   Eyes: Negative for pain, discharge and visual disturbance.  Respiratory: Negative for cough, chest tightness, wheezing and stridor.   Cardiovascular: Negative for chest pain, palpitations and leg swelling.  Gastrointestinal: Negative for nausea, vomiting, abdominal pain, diarrhea, constipation, blood in stool and abdominal distention.  Genitourinary: Negative for urgency, hematuria, flank pain, discharge, difficulty urinating and genital sores.  Musculoskeletal: Positive for joint swelling. Negative for myalgias, back pain, arthralgias and gait problem.  Skin: Negative for rash.  Neurological: Negative for dizziness, syncope, speech difficulty, weakness, numbness and headaches.  Hematological: Negative for adenopathy. Does not bruise/bleed easily.  Psychiatric/Behavioral: Negative for behavioral problems and dysphoric mood. The patient is not nervous/anxious.        Objective:   Physical Exam  Constitutional: He appears well-developed and well-nourished. No distress.       Overweight. Temp 98.3. Blood pressure 110/70  Musculoskeletal:       The right knee was unremarkable Quite warm to touch with slight edema. This involved the lateral knee only in the medial aspect of the knee was normal the left lateral knee was quite warm to touch No effusion was noted           Assessment & Plan:  Cellulitis left lateral knee. We'll treat with Augmentin. We'll also treat with short term leave. A prescription for Vicodin also dispensed that he may need. He'll call if there is not prompt clinical improvement

## 2011-03-17 NOTE — Patient Instructions (Signed)
Keep the left leg elevated as much as possible   Take Aleve 200 mg twice daily for pain or swelling  Take your antibiotic as prescribed until ALL of it is gone, but stop if you develop a rash, swelling, or any side effects of the medication.  Contact our office as soon as possible if  there are side effects of the medication.  Call or return to clinic prn if these symptoms worsen or fail to improve as anticipated.

## 2011-03-24 ENCOUNTER — Other Ambulatory Visit: Payer: Self-pay | Admitting: Internal Medicine

## 2011-03-28 NOTE — Telephone Encounter (Signed)
Dr. Swords pt 

## 2011-04-11 ENCOUNTER — Encounter: Payer: Self-pay | Admitting: Internal Medicine

## 2011-04-11 ENCOUNTER — Ambulatory Visit (INDEPENDENT_AMBULATORY_CARE_PROVIDER_SITE_OTHER): Payer: Medicare Other | Admitting: Internal Medicine

## 2011-04-11 DIAGNOSIS — N259 Disorder resulting from impaired renal tubular function, unspecified: Secondary | ICD-10-CM

## 2011-04-11 DIAGNOSIS — N320 Bladder-neck obstruction: Secondary | ICD-10-CM

## 2011-04-11 DIAGNOSIS — R739 Hyperglycemia, unspecified: Secondary | ICD-10-CM

## 2011-04-11 DIAGNOSIS — R7309 Other abnormal glucose: Secondary | ICD-10-CM

## 2011-04-11 DIAGNOSIS — I10 Essential (primary) hypertension: Secondary | ICD-10-CM

## 2011-04-11 DIAGNOSIS — D0910 Carcinoma in situ of unspecified urinary organ: Secondary | ICD-10-CM

## 2011-04-11 DIAGNOSIS — E785 Hyperlipidemia, unspecified: Secondary | ICD-10-CM

## 2011-04-11 LAB — LIPID PANEL
Total CHOL/HDL Ratio: 4
Triglycerides: 152 mg/dL — ABNORMAL HIGH (ref 0.0–149.0)

## 2011-04-11 LAB — BASIC METABOLIC PANEL
BUN: 28 mg/dL — ABNORMAL HIGH (ref 6–23)
CO2: 25 mEq/L (ref 19–32)
Calcium: 9.6 mg/dL (ref 8.4–10.5)
Glucose, Bld: 139 mg/dL — ABNORMAL HIGH (ref 70–99)
Potassium: 5.3 mEq/L — ABNORMAL HIGH (ref 3.5–5.1)

## 2011-04-11 LAB — CBC WITH DIFFERENTIAL/PLATELET
Basophils Absolute: 0 10*3/uL (ref 0.0–0.1)
Eosinophils Absolute: 0.1 10*3/uL (ref 0.0–0.7)
Lymphocytes Relative: 26.3 % (ref 12.0–46.0)
MCHC: 34 g/dL (ref 30.0–36.0)
MCV: 92 fl (ref 78.0–100.0)
Monocytes Absolute: 0.6 10*3/uL (ref 0.1–1.0)
Neutrophils Relative %: 61.9 % (ref 43.0–77.0)
Platelets: 212 10*3/uL (ref 150.0–400.0)
RBC: 5.19 Mil/uL (ref 4.22–5.81)

## 2011-04-11 LAB — HEPATIC FUNCTION PANEL
Alkaline Phosphatase: 72 U/L (ref 39–117)
Bilirubin, Direct: 0.1 mg/dL (ref 0.0–0.3)
Total Bilirubin: 0.6 mg/dL (ref 0.3–1.2)

## 2011-04-11 LAB — PSA: PSA: 1 ng/mL (ref 0.10–4.00)

## 2011-04-11 LAB — LDL CHOLESTEROL, DIRECT: Direct LDL: 136.5 mg/dL

## 2011-04-11 NOTE — Assessment & Plan Note (Signed)
Needs followup. Check labs today.

## 2011-04-11 NOTE — Assessment & Plan Note (Signed)
Previously controlled. Repeat laboratory work today.

## 2011-04-11 NOTE — Assessment & Plan Note (Signed)
Reviewed previous operative reports. Patient has regular followup with Dr. Jeffie Pollock. I'll check a PSA today.

## 2011-04-11 NOTE — Assessment & Plan Note (Signed)
Well controlled. Continue current medications  

## 2011-04-11 NOTE — Progress Notes (Signed)
  Subjective:    Patient ID: Charles Castillo, male    DOB: [birth year], 77 y.o.   MRN: [id number]  HPI  Patient comes in for followup. He has multiple medical issues but is feeling well. Patient has hypertension. He is tolerating medications without difficulty. Patient has a long-term history of renal insufficiency which has been stable. He needs followup. Patient has a history of hyperlipidemia tolerating simvastatin without difficulty. Patient has had  Review of Systems     Objective:   Physical Exam        Assessment & Plan:

## 2011-05-03 NOTE — Procedures (Signed)
NAME:  Charles Castillo, Charles Castillo NO.:  [id number]   MEDICAL RECORD NO.:  [id number]          PATIENT TYPE:  OUT   LOCATION:  SLEEP CENTER                 FACILITY:  [facility]   PHYSICIAN:  Kathee Delton, MD,FCCPDATE OF BIRTH:  July 29[birth year] 1936   DATE OF STUDY:  08/19/2008                            NOCTURNAL POLYSOMNOGRAM   REFERRING PHYSICIAN:  Bruce H. Swords, MD   INDICATION FOR STUDY:  Hypersomnia with sleep apnea.  Patient returns  for CPAP optimization after a sleep study showing sleep distorted  breathing.   EPWORTH SLEEPINESS SCORE:  8.   MEDICATIONS:   SLEEP ARCHITECTURE:  The patient had a total sleep time of 291 minutes  with no slow wave sleep and only 38 minutes of REM.  Sleep onset latency  was normal, however, REM onset was prolonged at 312 minutes.  Sleep  efficiency was decreased at 76%.   RESPIRATORY DATA:  The patient was fitted with a large Respironics  Profile Lite CPAP mask.  The patient was then placed on a CPAP pressure  of 5 cm to start the study and the pressure was increased over the night  in order to control both obstructive events and snoring.  At a final  pressure of 7 cm of water, the patient had no further obstructive  events, but did have an occasional breakthrough central apnea.  These  were not clinically significant.   OXYGEN DATA:  There was oxygen desaturation only as low as 91% and  transiently.   CARDIAC DATA:  No significant arrhythmia's were noted.   MOVEMENT-PARASOMNIA:  No leg jerks or abnormal behavior seen.   IMPRESSIONS-RECOMMENDATIONS:  Good control of previously documented  obstructive sleep apnea with a large Respironics Profile Lite CPAP mask  at a pressure of 7 cm of water.  The patient should also be encouraged  to work aggressively on weight loss.     Kathee Delton, MD,FCCP  Diplomate, [place] Board of Sleep  Medicine  Electronically Signed    [facility]/MEDQ  D:  08/20/2008 08:11:34  T:  08/20/2008 09:18:10   Job:  [id number]

## 2011-05-03 NOTE — Procedures (Signed)
NAME:  NESTOR, MARANO NO.:  [id number]   MEDICAL RECORD NO.:  [id number]          PATIENT TYPE:  OUT   LOCATION:  SLEEP CENTER                 [facility]AC[facility]   PHYSICIAN:  Kathee Delton, MD,FCCPDATE OF BIRTH:  1934/11/25   DATE OF STUDY:  07/22/2008                            NOCTURNAL POLYSOMNOGRAM   REFERRING PHYSICIAN:  Bruce H. Swords, MD   LOCATION:  Sleep lab.   REFERRING PHYSICIAN:  Dr. Phoebe Sharps.   INDICATION FOR STUDY:  Hypersomnia with sleep apnea.   EPWORTH SLEEPINESS SCORE:  8.   MEDICATIONS:   SLEEP ARCHITECTURE:  The patient had a total sleep time of 236 minutes  with no slow wave sleep and only 14 minutes of REM.  Sleep onset latency  was mildly prolonged at 37 minutes and REM onset was prolonged at 160  minutes.  Sleep efficiency was significantly decreased at 63%.   RESPIRATORY DATA:  The patient was found to have 7 obstructive apneas  and 8 hypopneas for a apnea hypopnea index of 4 events per hour.  However, he was found to have 87 respiratory effort-related arousals  with significant sleep disruption, resulting in a respiratory  disturbance index of 26 events per hour.  The events were not positional  and moderate snoring was noted throughout.  The patient did not meet  split-night criteria secondary to the majority of his events occurring  after midnight.   OXYGEN DATA:  The patient had 02 desaturation as low as 90% with his  obstructive events.   CARDIAC DATA:  No clinically significant arrhythmias were noted.   MOVEMENT-PARASOMNIA:  The patient had no significant leg jerks or  abnormal behaviors.   IMPRESSIONS-RECOMMENDATIONS:  Mild-to-moderate obstructive sleep apnea  with an apnea hypopnea index of 4 events per hour, however, a  respiratory disturbance index of 26 events per hour.  There was 02  saturation as low as 90%.  The patient did not meet split-night criteria  secondary to the majority of his events occurring  after  midnight.  Treatment for this degree of sleep apnea can include weight  loss alone if applicable, upper airway surgery, oral appliance, and also  continuous positive airway pressure.  Clinical correlation is suggested.      Kathee Delton, MD,FCCP  Diplomate, [place] Board of Sleep  Medicine  Electronically Signed     [facility]/MEDQ  D:  07/29/2008 15:14:22  T:  07/29/2008 16:04:40  Job:  [id number]

## 2011-05-06 NOTE — Op Note (Signed)
NAME:  Charles Castillo, Charles Castillo               ACCOUNT NO.:  [id number]   MEDICAL RECORD NO.:  [id number]          PATIENT TYPE:  AMB   LOCATION:  [facility]                         FACILITY:  [facility]   PHYSICIAN:  Marshall Cork. Jeffie Pollock, M.D.    DATE OF BIRTH:  August 24, 1934   DATE OF PROCEDURE:  03/29/2007  DATE OF DISCHARGE:                               OPERATIVE REPORT   Mr. 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  He also had some papillary  fronds in the prostatic urethra but on biopsy, these were found to be  noncancerous.  He returns now for repeat biopsy after course of  intraurethral 5-FU suppositories.   FINDINGS AND PROCEDURE:  The patient was taken operating room where  general anesthetic was induced.  He had been given Cipro preoperatively.  He was placed in lithotomy position.  His perineum and genitalia were  prepped with Betadine solution.  He was draped in the usual sterile  fashion.  His urethra was calibrated to 24-French with male sounds.  Cystoscopy was then performed using the 22-French scope and 12 and 70  degrees lenses.  Examination revealed some slight postsurgical changes  at the meatus but initially no significant urethral mucosal  abnormalities were noted.  The external sphincter was intact.  In the  prostatic urethra there was bilobar hyperplasia with some coaptation and  obstruction.  In the posterior distal prostatic urethra, there was some  blanching from his prior biopsy and fulguration site.  However, anterior  in the distal prostatic urethra were a couple of residual papillary  fronds.  Examination of bladder revealed mild trabeculation.  No tumor,  stones or inflammation were noted.  Ureteral orifices were unremarkable  effluxing clear urine.   After thorough cystoscopy, a cup biopsy forceps was used, to biopsy the  residual papillary  lesions in the anterior distal prostatic urethra.  After a thorough cystoscopy, bladder wash cytologies were obtained with  normal saline.  I then removed the cystoscope and obtained urethral wash  cytologies.   The cystoscope was reinserted and the papillary lesions in the prostatic  urethra were then biopsied and the specimens removed for permanent  sectioning.   I then biopsied the distal urethra in the area of the fossa navicularis.  Originally I did not see a papillary lesion but on further inspection, a  small papillary lesion was noted on the right side of the fossa  navicularis.  This was removed with cup biopsy forceps and the second  biopsy was obtained from that area as well.   The biopsy sites in the prostatic urethra and fossa navicularis was then  fulgurated with a Bugbee electrode gently.  Finally I obtained a  separate biopsy from the anterior urethral mucosa to ensure that there  was no carcinoma in situ in this area the biopsy site was very lightly  fulgurated.   At this point the bladder was drained.  The patient was taken down from  lithotomy  position.  His anesthetic was reversed.  He was removed to the  recovery room in stable condition.  There were no complications.      Marshall Cork. Jeffie Pollock, M.D.  Electronically Signed     JJW/MEDQ  D:  03/29/2007  T:  03/29/2007  Job:  [id number]   cc:   Darrick Penna. [place], [place]  [place] [address]

## 2011-05-06 NOTE — Op Note (Signed)
NAME:  TYMARION, MORRE               ACCOUNT NO.:  [id number]   MEDICAL RECORD NO.:  [id number]          PATIENT TYPE:  AMB   LOCATION:  [facility]                         FACILITY:  [facility]   PHYSICIAN:  Marshall Cork. Jeffie Pollock, M.D.    DATE OF BIRTH:  04/21/1934   DATE OF PROCEDURE:  05/01/2007  DATE OF DISCHARGE:                               OPERATIVE REPORT   PROCEDURE:  Partial urethrectomy.   PREOPERATIVE DIAGNOSIS:  History of squamous cell carcinoma of the fossa  navicularis and meatus.   POSTOPERATIVE DIAGNOSIS:  History of squamous cell carcinoma of the  fossa navicularis and meatus.   SURGEON:  Dr. Irine Seal.   ANESTHESIA:  General.   SPECIMEN:  Distal urethra with meatus.   DRAINS:  16-French Foley catheter.   COMPLICATIONS:  None.   INDICATIONS:  Mr. Haymond is a 77 year old white male with history of  squamous cell carcinoma in situ involving the urethral meatus and fossa  navicularis.  He had persistent disease after his initial biopsy and  treatment with 5-FU urethral suppositories.  He also has history of  nephrogenic adenoma of the prostatic urethra.  After discussing the  options of observation, use of 5-FU cream or distal urethrectomy, he has  elected the distal ureterectomy as a definitive procedure.   FINDINGS OF PROCEDURE:  The patient was taken operating room. He  received Cipro, and PAS hose were placed.  A general anesthetic was  induced.  He was left in the supine position.  His genitalia was prepped  with Betadine solution, and he was draped in the usual sterile fashion.  A circumcising incision was made, allowing retraction of the foreskin  approximately 3 cm on the ventral surface of the penis.  A 16-French  Foley catheter was then inserted, and a tourniquet was placed around the  base the penis.  A wedge resection of the urethral meatus was performed  using a scalpel.  This was then used to dissect out the distal urethra  for approximately 2.5 cm proximal to  the coronal sulcus.  Once the  urethra had been elevated, it was amputated at this point, leaving  approximately 0.5 cm of elevated urethra remaining for the anastomosis.  A stitch had been placed in the meatal end, and the urethral specimen  was sent for frozen section to clear the margins.   At this point, the tourniquet was removed from the penis, and deep  hemostatic stitches using 4-0 Vicryl were placed in the bed of the glans  resection.  The glans mucosa was then closed using interrupted 4-0  chromic vertical mattress sutures.  These were nicely hemostatic.  Once  the glans had been reconstructed, the urethral urethral dissection bed  was then closed using interrupted 4-0 Vicryl sutures for hemostasis.  The frozen section eventually returned negative, so an inverted V-shaped  incision was made on the ventral midline of the penis approximately 3 cm  proximal to the coronal sulcus.  This was carried down through the  dartos tissue until the urethral stump was identified.  The urethral  stump was then spatulated for  approximately half a centimeter on the  ventral surface, and interrupted 4-0 Vicryl sutures were used to create  the neomeatus on the ventrum of the penis.  Once the urethra had been  secured to the skin, the residual foreskin was trimmed, and the  circumcising incision was closed using a running 4-0 chromic.   Once the circumcising incision had been completely closed, inspection  revealed no active bleeding.  A fresh Foley catheter been placed after  completion of the anastomosis.  A dressing of Xeroform and Kling was  applied.  I did not use Coban as I did not want a compressive dressing  on the urethral meatus.  A penile block with 10 mL of 0.25% Marcaine was  then performed.  At this point, the drapes were removed.  The Foley was  placed to leg bag drainage.  The patient's anesthetic was reversed.  He  was moved to the recovery room in stable condition.  There were  no  complications.      Marshall Cork. Jeffie Pollock, M.D.  Electronically Signed     JJW/MEDQ  D:  05/01/2007  T:  05/01/2007  Job:  [id number]   cc:   Darrick Penna. [place], [place]  [place] [address]

## 2011-05-06 NOTE — Op Note (Signed)
NAME:  Charles Castillo, Charles Castillo               ACCOUNT NO.:  [id number]   MEDICAL RECORD NO.:  [id number]          PATIENT TYPE:  AMB   LOCATION:  [facility]                         [facility]ACILITY:  [facility]   PHYSICIAN:  Marshall Cork. Jeffie Pollock, M.D.    DATE OF BIRTH:  [birth year]   DATE OF PROCEDURE:  12/05/2006  DATE OF DISCHARGE:                               OPERATIVE REPORT   PROCEDURE:  1. Transrectal prostate ultrasound and biopsy.  2. Cystoscopy with bilateral retrograde pyelograms.  3. Prostatic urethral cup biopsies and resection of transurethral      prostatic urethral tumor greater than 2 cm.  4. Excisional biopsy of the urethral meatus and meatotomy.   PREOPERATIVE DIAGNOSIS:  Urethral meatal neoplasm and prostate nodule.   POSTOPERATIVE DIAGNOSIS:  Urethral meatal neoplasm and prostate nodule,  prostatic urethral neoplasm.   SURGEON:  Dr. Irine Seal   ANESTHESIA:  General.   SPECIMEN:  Right and left prostate biopsies, prostatic urethral cup, and  transurethral biopsies and excisional biopsy of urethral meatus.   DRAINS:  20-French Foley catheter.   COMPLICATIONS:  None.   INDICATIONS:  Charles Castillo is a 76 year old white male whom I originally  saw for voiding complaints and hematuria.  He was found to have an  erythematous rim of the meatus and on cystoscopic inspection, had a  papillary lesion of the very distal urethra.  Inspection of the  prostatic urethra suggested a papillary lesion, although with flexible  cystoscopy, it was not clearly defined.  The bladder was unremarkable  but on rectal exam, he did have an apical prostate nodule.  It was felt  that biopsy of the prostate and distal urethra were indicated with  possible biopsy of the prostatic urethra if lesion was found at rigid  cystoscopy.  Also, because of renal insufficiency, he needed retrograde  pyelography to complete his evaluation.   FINDINGS AT PROCEDURE:  The patient was given Cipro and Rocephin.  He  was taken to the  operating room where general anesthetic was induced.  He was placed in the lithotomy position.  The transrectal ultrasound  probe was assembled and inserted.  Scanning was performed.  This  demonstrated normal-appearing seminal vesicles in the transverse plane.  The prostate was symmetrical without obvious hypoechoic lesions in the  peripheral zone.  The transitional zone had a normal variegated  echotexture without lesions.  There was no abnormality in the area where  I had palpated a firmness.  Sagittal views revealed no additional  abnormalities.  Prostate volume is 76 mL.   After completion of the diagnostic scan, a 12 core pattern biopsy was  obtained with 6 cores from the right, 6 cores from the left in the usual  distribution.   He then underwent cystoscopy.  The urethra had to be calibrated with Charles Castillo sounds to 24-French to allow passage of the cystoscope.  Inspection revealed an erythematous rim of the meatus and a papillary  lesion in the distal urethra, consistent with most likely transitional  cell carcinoma.  Of note, the patient did have a positive cytology  preoperatively.  There  were also some changes extending approximately 1  cm into the urethra on the left side of the urethral mucosa.  The  remainder of the urethra was unremarkable up to normal-appearing  membranous sphincter.  However, in the apical aspect of the prostatic  urethra, extending in almost a circumferential fashion, was a papillary-  appearing tumor fronds.  This extended approximately 1-1.5 cm into the  prostatic urethra just beyond the proximal extent of the veru.  The  bladder neck area was free of tumor.  The bladder itself had mild  trabeculation without any tumor, stones, or inflammation noted.  Ureteral orifices were unremarkable.   Retrograde pyelography was performed.   Right retrograde pyelogram was done with contrast and a 5-French open-  end catheter.  This study revealed a delicate  ureter and internal  collecting system without filling defects.   The left retrograde pyelogram was performed with a 5-French open-end  catheter and contrast.  This also demonstrated a delicate ureter and  internal collecting system without filling defects.   At this point, a cup biopsy forceps was used to take several biopsies  from the prostatic urethra.   A 28-French resectoscope sheath was then inserted, and this was fitted  with an Charles Castillo handle, 12-degree lens, and appropriate loop.  The  apical prostatic area was resected to provide deep biopsies of the tumor  area.  The extent of the resection was approximately 2.5 x 1.5 cm.  The  specimen was removed for pathology.   Once hemostasis had been achieved in the prostatic urethra, I turned my  attention to the meatal lesion.  An initial attempt to engage this  papillary lesion with a cup biopsy forceps was not possible due to the  very distal nature of the lesion.  At this point, I elected to perform a  meatotomy.  A hemostat was used to crush the ventral aspect of the  meatus to allow incision and spatulation of the meatus.  Once the meatus  had been spatulated, the erythematous area out on the keratinized  portion of the glans was excised along with the mucosa within the meatus  that bordered the papillary lesions.  I felt that I had excised  sufficiently proximally to reach all of the papillary lesions within the  distal urethra.  Once the excision had been performed, the Bovie was  used to lightly cauterize some bleeding from the glandular surface.  The  edges of the glans and mucosa were then reapproximated using interrupted  4-0 Vicryl sutures.  Once the meatotomy had been repaired and hemostasis  was assured, repeat cystoscopy was performed to ensure that there were  no retained clots, tissue fragments, or active bleeding.  Once this was assured, the cystoscope was removed, and a 20-French Foley catheter was  inserted.   The balloon was filled with 10 mL of sterile fluid.   A penile block was then performed with 10 mL of 0.25% Marcaine.  The  catheter was placed to straight drainage.  The patient was taken down  from lithotomy position.  His anesthetic was reversed.  He was moved to  the recovery room in stable condition.  There were no complications.      Marshall Cork. Jeffie Pollock, M.D.  Electronically Signed     JJW/MEDQ  D:  12/05/2006  T:  12/05/2006  Job:  [id number]   cc:   Darrick Penna. [place], [place]  [place] [address]

## 2011-09-10 ENCOUNTER — Other Ambulatory Visit: Payer: Self-pay | Admitting: Internal Medicine

## 2011-10-10 ENCOUNTER — Encounter: Payer: Self-pay | Admitting: Internal Medicine

## 2011-10-10 ENCOUNTER — Ambulatory Visit (INDEPENDENT_AMBULATORY_CARE_PROVIDER_SITE_OTHER): Payer: Medicare Other | Admitting: Internal Medicine

## 2011-10-10 DIAGNOSIS — E785 Hyperlipidemia, unspecified: Secondary | ICD-10-CM

## 2011-10-10 DIAGNOSIS — I10 Essential (primary) hypertension: Secondary | ICD-10-CM

## 2011-10-10 DIAGNOSIS — M1990 Unspecified osteoarthritis, unspecified site: Secondary | ICD-10-CM

## 2011-10-10 DIAGNOSIS — N259 Disorder resulting from impaired renal tubular function, unspecified: Secondary | ICD-10-CM

## 2011-10-10 DIAGNOSIS — D0910 Carcinoma in situ of unspecified urinary organ: Secondary | ICD-10-CM

## 2011-10-10 LAB — CBC WITH DIFFERENTIAL/PLATELET
Basophils Absolute: 0 10*3/uL (ref 0.0–0.1)
Basophils Relative: 0.7 % (ref 0.0–3.0)
HCT: 47.3 % (ref 39.0–52.0)
Hemoglobin: 16.1 g/dL (ref 13.0–17.0)
Lymphs Abs: 2.1 10*3/uL (ref 0.7–4.0)
Monocytes Relative: 9.3 % (ref 3.0–12.0)
Neutro Abs: 3.7 10*3/uL (ref 1.4–7.7)
RBC: 5.1 Mil/uL (ref 4.22–5.81)
RDW: 14 % (ref 11.5–14.6)

## 2011-10-10 LAB — BASIC METABOLIC PANEL
BUN: 23 mg/dL (ref 6–23)
CO2: 27 mEq/L (ref 19–32)
Chloride: 110 mEq/L (ref 96–112)
Creatinine, Ser: 2 mg/dL — ABNORMAL HIGH (ref 0.4–1.5)

## 2011-10-10 LAB — HEPATIC FUNCTION PANEL
Albumin: 4.2 g/dL (ref 3.5–5.2)
Alkaline Phosphatase: 68 U/L (ref 39–117)
Bilirubin, Direct: 0 mg/dL (ref 0.0–0.3)

## 2011-10-10 LAB — LIPID PANEL
LDL Cholesterol: 120 mg/dL — ABNORMAL HIGH (ref 0–99)
Total CHOL/HDL Ratio: 4
Triglycerides: 122 mg/dL (ref 0.0–149.0)
VLDL: 24.4 mg/dL (ref 0.0–40.0)

## 2011-10-10 NOTE — Progress Notes (Signed)
  Subjective:    Patient ID: Charles Castillo, male    DOB: [birth year], 77 y.o.   MRN: [id number]  HPI  Patient comes in for followup. He has multiple medical issues but is feeling well. Patient has hypertension. He is tolerating medications without difficulty. Patient has a long-term history of renal insufficiency which has been stable. He needs followup. Patient has a history of hyperlipidemia tolerating simvastatin without difficulty.    Past Medical History  Diagnosis Date  . Hypertension   . Arthritis   . Squamous cell carcinoma     penile and urethral   Past Surgical History  Procedure Date  . Urethectomy 04/2007    PROCEDURE:  Partial urethrectomy.    reports that he quit smoking about 42 years ago. He has never used smokeless tobacco. He reports that he does not drink alcohol or use illicit drugs. family history includes Heart disease in his father and Heart failure in his father. No Known Allergies  Review of Systems  patient denies chest pain, shortness of breath, orthopnea. Denies lower extremity edema, abdominal pain, change in appetite, change in bowel movements. Patient denies rashes, musculoskeletal complaints. No other specific complaints in a complete review of systems.      Objective:   Physical Exam  well-developed well-nourished male in no acute distress. HEENT exam atraumatic, normocephalic, neck supple without jugular venous distention. Chest clear to auscultation cardiac exam S1-S2 are regular. Abdominal exam overweight with bowel sounds, soft and nontender. Extremities no edema. Neurologic exam is alert with a normal gait.        Assessment & Plan:

## 2011-10-10 NOTE — Assessment & Plan Note (Signed)
No meds,  HDL has been in "good" range

## 2011-10-10 NOTE — Assessment & Plan Note (Signed)
BP Readings from Last 3 Encounters:  10/10/11 122/74  04/11/11 114/74  03/17/11 110/70   Controlled Continue same meds

## 2011-11-28 ENCOUNTER — Ambulatory Visit (INDEPENDENT_AMBULATORY_CARE_PROVIDER_SITE_OTHER): Payer: Medicare Other | Admitting: Family Medicine

## 2011-11-28 ENCOUNTER — Encounter: Payer: Self-pay | Admitting: Family Medicine

## 2011-11-28 VITALS — BP 140/70 | Temp 98.4°F | Wt 292.0 lb

## 2011-11-28 DIAGNOSIS — L02619 Cutaneous abscess of unspecified foot: Secondary | ICD-10-CM

## 2011-11-28 DIAGNOSIS — L03039 Cellulitis of unspecified toe: Secondary | ICD-10-CM

## 2011-11-28 MED ORDER — CEPHALEXIN 500 MG PO CAPS: 500.0000 mg | ORAL_CAPSULE | Freq: Three times a day (TID) | ORAL | Status: AC

## 2011-11-28 NOTE — Patient Instructions (Signed)
Warm water soaks to foot 2-3 times daily Follow up in 2 weeks if no better.

## 2011-11-28 NOTE — Progress Notes (Signed)
  Subjective:    Patient ID: Charles Castillo, male    DOB: 1934/08/03, 77 y.o.   MRN: [id number]  HPI  Right great toe irritation and redness. Started about 4-5 days ago. No injury. Patient does not have any history of diabetes or peripheral vascular disease. He has noticed some redness but no drainage. Soreness is along one border only. No alleviating factors.  Review of Systems  Constitutional: Negative for fever and chills.  Musculoskeletal: Negative for gait problem.       Objective:   Physical Exam  Constitutional: He appears well-developed and well-nourished. No distress.  Cardiovascular: Normal rate and regular rhythm.   Pulmonary/Chest: Effort normal and breath sounds normal. No respiratory distress. He has no wheezes. He has no rales.  Musculoskeletal:       Right great toe refill some erythema along the lateral border. No fluctuance. No evidence for paronychia or abscess.          Assessment & Plan:  Cellulitis right great toe with possible early ingrown toenail. No evidence for abscess. Warm soaks 2-3 times daily. Keflex 500 mg 3 times a day for 10 days. Consider partial nail excision if no better in 2 weeks

## 2011-12-01 ENCOUNTER — Telehealth: Payer: Self-pay | Admitting: Internal Medicine

## 2011-12-01 ENCOUNTER — Encounter: Payer: Self-pay | Admitting: Family Medicine

## 2011-12-01 ENCOUNTER — Ambulatory Visit (INDEPENDENT_AMBULATORY_CARE_PROVIDER_SITE_OTHER): Payer: Medicare Other | Admitting: Family Medicine

## 2011-12-01 VITALS — BP 120/80 | Temp 98.1°F | Wt 292.0 lb

## 2011-12-01 DIAGNOSIS — L600 Ingrowing nail: Secondary | ICD-10-CM

## 2011-12-01 NOTE — Telephone Encounter (Signed)
error 

## 2011-12-01 NOTE — Patient Instructions (Signed)
Keep toe dry for 24 hours then clean daily with soap and water. Topical antibiotic daily for 3-4 days Keep the toe covered with Band-Aid for at least 3-4 days

## 2011-12-01 NOTE — Progress Notes (Signed)
  Subjective:    Patient ID: Charles Castillo, male    DOB: 13-Nov-1934, 77 y.o.   MRN: [id number]  HPI  Persistent soreness right great toe. No drainage. Started Keflex and warm soaks 3 days ago. No toe injury. Some pain with walking. No alleviating factors. Denies fever chills   Review of Systems As per history of present illness    Objective:   Physical Exam  Constitutional: He appears well-developed and well-nourished.  Cardiovascular: Normal rate and regular rhythm.   Musculoskeletal:       Right great toe reveals ingrown lateral border. No granulation tissue. No paronychia. No drainage. Mild erythema along lateral border and tenderness along lateral border          Assessment & Plan:  Ingrown right great toe. Discussed risks and benefits of partial nail excision patient consented. Anesthesia digital block 1% plain Xylocaine. Remove one third of the involved nail. Minimal bleeding. Cleaned base with peroxide. Antibiotic and dressing applied.

## 2011-12-06 ENCOUNTER — Other Ambulatory Visit: Payer: Self-pay | Admitting: Internal Medicine

## 2012-01-13 DIAGNOSIS — L821 Other seborrheic keratosis: Secondary | ICD-10-CM | POA: Diagnosis not present

## 2012-01-13 DIAGNOSIS — L719 Rosacea, unspecified: Secondary | ICD-10-CM | POA: Diagnosis not present

## 2012-01-13 DIAGNOSIS — L219 Seborrheic dermatitis, unspecified: Secondary | ICD-10-CM | POA: Diagnosis not present

## 2012-01-13 DIAGNOSIS — D239 Other benign neoplasm of skin, unspecified: Secondary | ICD-10-CM | POA: Diagnosis not present

## 2012-02-29 ENCOUNTER — Other Ambulatory Visit: Payer: Self-pay | Admitting: Internal Medicine

## 2012-03-26 DIAGNOSIS — D099 Carcinoma in situ, unspecified: Secondary | ICD-10-CM | POA: Diagnosis not present

## 2012-03-26 DIAGNOSIS — N529 Male erectile dysfunction, unspecified: Secondary | ICD-10-CM | POA: Diagnosis not present

## 2012-03-26 DIAGNOSIS — N400 Enlarged prostate without lower urinary tract symptoms: Secondary | ICD-10-CM | POA: Diagnosis not present

## 2012-04-09 ENCOUNTER — Encounter: Payer: Self-pay | Admitting: Internal Medicine

## 2012-04-09 ENCOUNTER — Ambulatory Visit (INDEPENDENT_AMBULATORY_CARE_PROVIDER_SITE_OTHER): Payer: Medicare Other | Admitting: Internal Medicine

## 2012-04-09 VITALS — BP 120/76 | HR 84 | Temp 98.1°F | Wt 292.0 lb

## 2012-04-09 DIAGNOSIS — I10 Essential (primary) hypertension: Secondary | ICD-10-CM

## 2012-04-09 DIAGNOSIS — D0910 Carcinoma in situ of unspecified urinary organ: Secondary | ICD-10-CM | POA: Diagnosis not present

## 2012-04-09 DIAGNOSIS — E785 Hyperlipidemia, unspecified: Secondary | ICD-10-CM

## 2012-04-09 DIAGNOSIS — N259 Disorder resulting from impaired renal tubular function, unspecified: Secondary | ICD-10-CM | POA: Diagnosis not present

## 2012-04-09 LAB — BASIC METABOLIC PANEL
BUN: 23 mg/dL (ref 6–23)
CO2: 25 mEq/L (ref 19–32)
Chloride: 109 mEq/L (ref 96–112)
Creatinine, Ser: 1.7 mg/dL — ABNORMAL HIGH (ref 0.4–1.5)
Glucose, Bld: 129 mg/dL — ABNORMAL HIGH (ref 70–99)
Potassium: 5.1 mEq/L (ref 3.5–5.1)

## 2012-04-09 LAB — LIPID PANEL
HDL: 54.9 mg/dL (ref >39.00)
Total CHOL/HDL Ratio: 3

## 2012-04-09 LAB — HEPATIC FUNCTION PANEL
AST: 22 U/L (ref 0–37)
Total Bilirubin: 0.7 mg/dL (ref 0.3–1.2)

## 2012-04-09 MED ORDER — TRIAMCINOLONE ACETONIDE 0.025 % EX OINT: TOPICAL_OINTMENT | Freq: Two times a day (BID) | CUTANEOUS | Status: AC

## 2012-04-09 NOTE — Progress Notes (Signed)
Patient ID: Charles Castillo, male   DOB: [birth year], 78 y.o.   MRN: [id number]  htn-patient is tolerating medications without difficulty.  uretheral ca--has regular followup with urology.  Renal insuff--- needs followup laboratories.  Hyperlipidemia, patient tolerating medications without difficulty.  Past Medical History  Diagnosis Date  . Hypertension   . Arthritis   . Squamous cell carcinoma     penile and urethral    History   Social History  . Marital Status: Married    Spouse Name: N/A    Number of Children: N/A  . Years of Education: N/A   Occupational History  . Not on file.   Social History Main Topics  . Smoking status: Former Smoker    Quit date: 12/19/1968  . Smokeless tobacco: Never Used  . Alcohol Use: No  . Drug Use: No  . Sexually Active: Not on file   Other Topics Concern  . Not on file   Social History Narrative  . No narrative on file    Past Surgical History  Procedure Date  . Urethectomy 04/2007    PROCEDURE:  Partial urethrectomy.    Family History  Problem Relation Age of Onset  . Heart failure Father   . Heart disease Father     No Known Allergies  Current Outpatient Prescriptions on File Prior to Visit  Medication Sig Dispense Refill  . amLODipine (NORVASC) 5 MG tablet TAKE ONE TABLET BY MOUTH ONE TIME DAILY  90 tablet  0  . aspirin 81 MG tablet Take 81 mg by mouth daily.        . simvastatin (ZOCOR) 40 MG tablet TAKE ONE TABLET BY MOUTH ONE TIME DAILY  90 tablet  2  . tadalafil (CIALIS) 20 MG tablet Take 20 mg by mouth daily as needed.           patient denies chest pain, shortness of breath, orthopnea. Denies lower extremity edema, abdominal pain, change in appetite, change in bowel movements. Patient denies rashes, musculoskeletal complaints. No other specific complaints in a complete review of systems.   BP 120/76  Pulse 84  Temp(Src) 98.1 F (36.7 C) (Oral)  Wt 292 lb (132.45 kg)  well-developed well-nourished male in  no acute distress. HEENT exam atraumatic, normocephalic, neck supple without jugular venous distention. Chest clear to auscultation cardiac exam S1-S2 are regular. Abdominal exam overweight with bowel sounds, soft and nontender. Extremities no edema. Neurologic exam is alert with a normal gait.

## 2012-04-10 NOTE — Assessment & Plan Note (Signed)
Continue current medications. Check laboratory work today.

## 2012-04-10 NOTE — Assessment & Plan Note (Signed)
BP Readings from Last 3 Encounters:  04/09/12 120/76  12/01/11 120/80  11/28/11 140/70   Controlled. Continue current medications.

## 2012-04-10 NOTE — Assessment & Plan Note (Signed)
Check laboratory work.

## 2012-04-10 NOTE — Assessment & Plan Note (Signed)
Has regular followup with urology.

## 2012-05-21 ENCOUNTER — Other Ambulatory Visit: Payer: Self-pay | Admitting: Internal Medicine

## 2012-08-27 ENCOUNTER — Other Ambulatory Visit: Payer: Self-pay | Admitting: Internal Medicine

## 2012-08-31 DIAGNOSIS — Z23 Encounter for immunization: Secondary | ICD-10-CM | POA: Diagnosis not present

## 2012-10-10 ENCOUNTER — Encounter: Payer: Self-pay | Admitting: Internal Medicine

## 2012-10-10 ENCOUNTER — Ambulatory Visit (INDEPENDENT_AMBULATORY_CARE_PROVIDER_SITE_OTHER): Payer: Medicare Other | Admitting: Internal Medicine

## 2012-10-10 VITALS — BP 114/72 | HR 76 | Temp 97.8°F | Wt 290.0 lb

## 2012-10-10 DIAGNOSIS — R739 Hyperglycemia, unspecified: Secondary | ICD-10-CM

## 2012-10-10 DIAGNOSIS — N259 Disorder resulting from impaired renal tubular function, unspecified: Secondary | ICD-10-CM | POA: Diagnosis not present

## 2012-10-10 DIAGNOSIS — R7309 Other abnormal glucose: Secondary | ICD-10-CM

## 2012-10-10 DIAGNOSIS — E785 Hyperlipidemia, unspecified: Secondary | ICD-10-CM | POA: Diagnosis not present

## 2012-10-10 DIAGNOSIS — I10 Essential (primary) hypertension: Secondary | ICD-10-CM

## 2012-10-10 DIAGNOSIS — E663 Overweight: Secondary | ICD-10-CM

## 2012-10-10 DIAGNOSIS — D0910 Carcinoma in situ of unspecified urinary organ: Secondary | ICD-10-CM

## 2012-10-10 LAB — BASIC METABOLIC PANEL
CO2: 26 mEq/L (ref 19–32)
Calcium: 9.6 mg/dL (ref 8.4–10.5)
Glucose, Bld: 119 mg/dL — ABNORMAL HIGH (ref 70–99)
Potassium: 5 mEq/L (ref 3.5–5.1)
Sodium: 142 mEq/L (ref 135–145)

## 2012-10-10 LAB — HEPATIC FUNCTION PANEL
AST: 24 U/L (ref 0–37)
Albumin: 3.6 g/dL (ref 3.5–5.2)
Alkaline Phosphatase: 67 U/L (ref 39–117)
Total Protein: 7.7 g/dL (ref 6.0–8.3)

## 2012-10-10 LAB — HEMOGLOBIN A1C: Hgb A1c MFr Bld: 6.5 % (ref 4.6–6.5)

## 2012-10-14 ENCOUNTER — Encounter: Payer: Self-pay | Admitting: Internal Medicine

## 2012-10-14 NOTE — Assessment & Plan Note (Signed)
Check labs today Continue meds

## 2012-10-14 NOTE — Progress Notes (Signed)
Patient ID: Charles Castillo, male   DOB: 1934-12-15, 78 y.o.   MRN: [id number] Patient Active Problem List  Diagnosis  . CARCINOMA SITU OTHER&UNSPECIFIED URINARY ORGANS- no recurrence  . HYPERLIPIDEMIA- tolerating meds  . HYPERTENSION- no home bps but taking meds as prescribed  . RENAL INSUFFICIENCY-- probably related to htn   He is not exercising  Past Medical History  Diagnosis Date  . Hypertension   . Arthritis   . Squamous cell carcinoma     penile and urethral    History   Social History  . Marital Status: Married    Spouse Name: N/A    Number of Children: N/A  . Years of Education: N/A   Occupational History  . Not on file.   Social History Main Topics  . Smoking status: Former Smoker    Quit date: 12/19/1968  . Smokeless tobacco: Never Used  . Alcohol Use: No  . Drug Use: No  . Sexually Active: Not on file   Other Topics Concern  . Not on file   Social History Narrative  . No narrative on file    Past Surgical History  Procedure Date  . Urethectomy 04/2007    PROCEDURE:  Partial urethrectomy.    Family History  Problem Relation Age of Onset  . Heart failure Father   . Heart disease Father     No Known Allergies  Current Outpatient Prescriptions on File Prior to Visit  Medication Sig Dispense Refill  . amLODipine (NORVASC) 5 MG tablet TAKE ONE TABLET BY MOUTH ONE TIME DAILY  90 tablet  1  . aspirin 81 MG tablet Take 81 mg by mouth daily.        . simvastatin (ZOCOR) 40 MG tablet TAKE ONE TABLET BY MOUTH ONE TIME DAILY  90 tablet  1  . tadalafil (CIALIS) 20 MG tablet Take 20 mg by mouth daily as needed.        . triamcinolone (KENALOG) 0.025 % ointment Apply topically 2 (two) times daily.  30 g  0     patient denies chest pain, shortness of breath, orthopnea. Denies lower extremity edema, abdominal pain, change in appetite, change in bowel movements. Patient denies rashes, musculoskeletal complaints. No other specific complaints in a complete review  of systems.   BP 114/72  Pulse 76  Temp 97.8 F (36.6 C) (Oral)  Wt 290 lb (131.543 kg)  well-developed well-nourished male in no acute distress. HEENT exam atraumatic, normocephalic, neck supple without jugular venous distention. Chest clear to auscultation cardiac exam S1-S2 are regular. Abdominal exam overweight with bowel sounds, soft and nontender.

## 2012-10-14 NOTE — Assessment & Plan Note (Signed)
BP Readings from Last 3 Encounters:  10/10/12 114/72  04/09/12 120/76  12/01/11 120/80   Adequate control Continue same meds Aggressive weight loss encouraged

## 2012-10-14 NOTE — Assessment & Plan Note (Signed)
No known recurrence 

## 2012-10-14 NOTE — Assessment & Plan Note (Signed)
Likely hypertensive Continue to follow regularly

## 2012-10-16 ENCOUNTER — Telehealth: Payer: Self-pay | Admitting: Family Medicine

## 2012-10-16 NOTE — Telephone Encounter (Signed)
[facility] Triage Call Report Triage Record Num: [id number] Operator: Vanessa [facility] [place] Patient Name: Charles Castillo Call Date & Time: 10/15/2012 6:42:02PM Patient Phone: [phone] PCP: Darrick Penna. Swords Patient Gender: Male PCP Fax : [phone] Patient DOB: 03/02/1934 Practice Name: [facility] Reason for Call: Caller: Charles Castillo/Patient; PCP: Phoebe Sharps (Adults only); CB#: [id number]; Patient is calling about needs refill for Cialis. Advised patient to call office 10/16/12 as this RN is unable to call in refills. Verbalized understanding. Protocol(s) Used: Office Note Recommended Outcome per Protocol: Information Noted and Sent to Office Reason for Outcome: Caller information to office Care Advice: ~ 10/

## 2012-10-17 ENCOUNTER — Encounter: Payer: Self-pay | Admitting: Internal Medicine

## 2012-10-17 ENCOUNTER — Telehealth: Payer: Self-pay | Admitting: Internal Medicine

## 2012-10-17 MED ORDER — TADALAFIL 20 MG PO TABS: 20.0000 mg | ORAL_TABLET | Freq: Every day | ORAL | Status: DC | PRN

## 2012-10-17 NOTE — Addendum Note (Signed)
Addended by: Townsend Roger D on: 10/17/2012 11:08 AM   Modules accepted: Orders

## 2012-10-18 ENCOUNTER — Encounter: Payer: Self-pay | Admitting: Internal Medicine

## 2012-10-18 MED ORDER — TADALAFIL 20 MG PO TABS: 20.0000 mg | ORAL_TABLET | Freq: Every day | ORAL | Status: DC | PRN

## 2012-10-18 NOTE — Addendum Note (Signed)
Addended by: Townsend Roger D on: 10/18/2012 04:58 PM   Modules accepted: Orders

## 2012-11-24 ENCOUNTER — Other Ambulatory Visit: Payer: Self-pay | Admitting: Internal Medicine

## 2012-11-28 DIAGNOSIS — L821 Other seborrheic keratosis: Secondary | ICD-10-CM | POA: Diagnosis not present

## 2012-11-28 DIAGNOSIS — L909 Atrophic disorder of skin, unspecified: Secondary | ICD-10-CM | POA: Diagnosis not present

## 2012-11-28 DIAGNOSIS — L819 Disorder of pigmentation, unspecified: Secondary | ICD-10-CM | POA: Diagnosis not present

## 2012-11-28 DIAGNOSIS — L820 Inflamed seborrheic keratosis: Secondary | ICD-10-CM | POA: Diagnosis not present

## 2012-11-28 DIAGNOSIS — L919 Hypertrophic disorder of the skin, unspecified: Secondary | ICD-10-CM | POA: Diagnosis not present

## 2012-11-28 DIAGNOSIS — D1801 Hemangioma of skin and subcutaneous tissue: Secondary | ICD-10-CM | POA: Diagnosis not present

## 2012-11-28 DIAGNOSIS — L723 Sebaceous cyst: Secondary | ICD-10-CM | POA: Diagnosis not present

## 2013-04-16 ENCOUNTER — Ambulatory Visit: Payer: Medicare Other | Admitting: Internal Medicine

## 2013-04-23 ENCOUNTER — Encounter: Payer: Self-pay | Admitting: Internal Medicine

## 2013-04-23 ENCOUNTER — Ambulatory Visit (INDEPENDENT_AMBULATORY_CARE_PROVIDER_SITE_OTHER): Payer: Medicare Other | Admitting: Internal Medicine

## 2013-04-23 VITALS — BP 124/82 | HR 76 | Temp 98.3°F | Wt 285.0 lb

## 2013-04-23 DIAGNOSIS — D0910 Carcinoma in situ of unspecified urinary organ: Secondary | ICD-10-CM

## 2013-04-23 DIAGNOSIS — G4730 Sleep apnea, unspecified: Secondary | ICD-10-CM | POA: Diagnosis not present

## 2013-04-23 DIAGNOSIS — R739 Hyperglycemia, unspecified: Secondary | ICD-10-CM

## 2013-04-23 DIAGNOSIS — R7309 Other abnormal glucose: Secondary | ICD-10-CM | POA: Diagnosis not present

## 2013-04-23 DIAGNOSIS — E785 Hyperlipidemia, unspecified: Secondary | ICD-10-CM

## 2013-04-23 DIAGNOSIS — I10 Essential (primary) hypertension: Secondary | ICD-10-CM

## 2013-04-23 DIAGNOSIS — N259 Disorder resulting from impaired renal tubular function, unspecified: Secondary | ICD-10-CM | POA: Diagnosis not present

## 2013-04-23 LAB — HEPATIC FUNCTION PANEL
ALT: 28 U/L (ref 0–53)
Albumin: 3.8 g/dL (ref 3.5–5.2)
Total Protein: 7.4 g/dL (ref 6.0–8.3)

## 2013-04-23 LAB — BASIC METABOLIC PANEL
BUN: 20 mg/dL (ref 6–23)
CO2: 26 mEq/L (ref 19–32)
Calcium: 9.4 mg/dL (ref 8.4–10.5)
GFR: 38.64 mL/min — ABNORMAL LOW (ref >60.00)
Glucose, Bld: 138 mg/dL — ABNORMAL HIGH (ref 70–99)

## 2013-04-23 LAB — LIPID PANEL
HDL: 49.7 mg/dL (ref >39.00)
Triglycerides: 89 mg/dL (ref 0.0–149.0)

## 2013-04-24 NOTE — Assessment & Plan Note (Signed)
Will check lipid profile

## 2013-04-24 NOTE — Progress Notes (Signed)
Patient ID: Charles Castillo, male   DOB: January 09, 1934, 79 y.o.   MRN: [id number]  Very pleasant 79 year old male comes in for followup. Patient has multiple medical problems but is doing quite well. Patient has a history of hypertension and is tolerating medications without difficulty. Patient has a history of urethral squamous cell carcinoma. He has been followed by urology but that's not necessarily longer. Patient has history of renal insufficiency and needs follow.  Patient has history of hyperlipidemia and is tolerating simvastatin without difficulty.  Past Medical History  Diagnosis Date  . Hypertension   . Arthritis   . Squamous cell carcinoma     penile and urethral    History   Social History  . Marital Status: Married    Spouse Name: N/A    Number of Children: N/A  . Years of Education: N/A   Occupational History  . Not on file.   Social History Main Topics  . Smoking status: Former Smoker    Quit date: 12/19/1968  . Smokeless tobacco: Never Used  . Alcohol Use: No  . Drug Use: No  . Sexually Active: Not on file   Other Topics Concern  . Not on file   Social History Narrative  . No narrative on file    Past Surgical History  Procedure Laterality Date  . Urethectomy  04/2007    PROCEDURE:  Partial urethrectomy.    Family History  Problem Relation Age of Onset  . Heart failure Father   . Heart disease Father     No Known Allergies  Current Outpatient Prescriptions on File Prior to Visit  Medication Sig Dispense Refill  . amLODipine (NORVASC) 5 MG tablet TAKE ONE TABLET BY MOUTH ONE TIME DAILY  90 tablet  0  . aspirin 81 MG tablet Take 81 mg by mouth daily.        . simvastatin (ZOCOR) 40 MG tablet TAKE ONE TABLET BY MOUTH ONE TIME DAILY  90 tablet  1  . tadalafil (CIALIS) 20 MG tablet Take 1 tablet (20 mg total) by mouth daily as needed.  10 tablet  5   No current facility-administered medications on file prior to visit.     patient denies chest pain,  shortness of breath, orthopnea. Denies lower extremity edema, abdominal pain, change in appetite, change in bowel movements. Patient denies rashes, musculoskeletal complaints. No other specific complaints in a complete review of systems.   BP 124/82  Pulse 76  Temp(Src) 98.3 F (36.8 C) (Oral)  Wt 285 lb (129.275 kg)  BMI 36.58 kg/m2   well-developed well-nourished male in no acute distress. HEENT exam atraumatic, normocephalic, neck supple without jugular venous distention. Chest clear to auscultation cardiac exam S1-S2 are regular. Abdominal exam overweight with bowel sounds, soft and nontender. Extremities no edema. Neurologic exam is alert with a normal gait.

## 2013-04-24 NOTE — Assessment & Plan Note (Signed)
Well controlled. Continue current medications  

## 2013-04-24 NOTE — Assessment & Plan Note (Signed)
Lab Results  Component Value Date   CREATININE 1.8* 04/23/2013    Will check basic metabolic profile.

## 2013-04-24 NOTE — Assessment & Plan Note (Signed)
He has completed followup with urology.

## 2013-04-26 ENCOUNTER — Telehealth: Payer: Self-pay | Admitting: Internal Medicine

## 2013-04-26 NOTE — Telephone Encounter (Signed)
Patient has emailed me to inquire about his next visit. It went as follows;  Appointment Request From: Charles Castillo      With Provider: Chancy Hurter, MD [-Primary Care Physician-]      Preferred Date Range: Any date 04/26/2013 or later      Preferred Times: Any      Reason for visit: Annual Physical      Comments:   I saw [name] on May 6th and he didn't advise me on my future appointment. Should I make an appointment and when?      Charles Castillo      I would like to know if Dr. Leanne Chang would like to see him back in 51 month for a CPE, thank you!

## 2013-04-28 NOTE — Telephone Encounter (Signed)
See me 6 months

## 2013-08-05 ENCOUNTER — Other Ambulatory Visit: Payer: Self-pay | Admitting: Internal Medicine

## 2013-09-17 DIAGNOSIS — Z23 Encounter for immunization: Secondary | ICD-10-CM | POA: Diagnosis not present

## 2013-09-23 DIAGNOSIS — H26499 Other secondary cataract, unspecified eye: Secondary | ICD-10-CM | POA: Diagnosis not present

## 2013-09-23 DIAGNOSIS — H43399 Other vitreous opacities, unspecified eye: Secondary | ICD-10-CM | POA: Diagnosis not present

## 2013-10-22 ENCOUNTER — Encounter: Payer: Self-pay | Admitting: Internal Medicine

## 2013-10-22 ENCOUNTER — Ambulatory Visit (INDEPENDENT_AMBULATORY_CARE_PROVIDER_SITE_OTHER): Payer: Medicare Other | Admitting: Internal Medicine

## 2013-10-22 VITALS — BP 110/70 | HR 80 | Temp 98.4°F | Wt 284.0 lb

## 2013-10-22 DIAGNOSIS — E663 Overweight: Secondary | ICD-10-CM

## 2013-10-22 DIAGNOSIS — D0910 Carcinoma in situ of unspecified urinary organ: Secondary | ICD-10-CM

## 2013-10-22 DIAGNOSIS — R7309 Other abnormal glucose: Secondary | ICD-10-CM

## 2013-10-22 DIAGNOSIS — R739 Hyperglycemia, unspecified: Secondary | ICD-10-CM

## 2013-10-22 DIAGNOSIS — N259 Disorder resulting from impaired renal tubular function, unspecified: Secondary | ICD-10-CM

## 2013-10-22 DIAGNOSIS — I10 Essential (primary) hypertension: Secondary | ICD-10-CM | POA: Diagnosis not present

## 2013-10-22 LAB — BASIC METABOLIC PANEL
BUN: 26 mg/dL — ABNORMAL HIGH (ref 6–23)
CO2: 26 mEq/L (ref 19–32)
Chloride: 106 mEq/L (ref 96–112)
Creatinine, Ser: 1.8 mg/dL — ABNORMAL HIGH (ref 0.4–1.5)
Potassium: 4.5 mEq/L (ref 3.5–5.1)

## 2013-10-22 LAB — HEMOGLOBIN A1C: Hgb A1c MFr Bld: 6.5 % (ref 4.6–6.5)

## 2013-10-22 NOTE — Assessment & Plan Note (Signed)
Well controlled Continue same meds 

## 2013-10-22 NOTE — Assessment & Plan Note (Signed)
Will check bmet today

## 2013-10-22 NOTE — Assessment & Plan Note (Signed)
Has been released from urology

## 2013-10-22 NOTE — Assessment & Plan Note (Signed)
Needs to lse weight

## 2013-10-22 NOTE — Progress Notes (Signed)
htn Hyperglycemia  Renal insuff  Lipids- tolerating meds  OSA- uses CPAP nightly with great results.   Flu vaccine- done 1 month ago  Past Medical History  Diagnosis Date  . Hypertension   . Arthritis   . Squamous cell carcinoma     penile and urethral    History   Social History  . Marital Status: Married    Spouse Name: N/A    Number of Children: N/A  . Years of Education: N/A   Occupational History  . Not on file.   Social History Main Topics  . Smoking status: Former Smoker    Quit date: 12/19/1968  . Smokeless tobacco: Never Used  . Alcohol Use: No  . Drug Use: No  . Sexual Activity: Not on file   Other Topics Concern  . Not on file   Social History Narrative  . No narrative on file    Past Surgical History  Procedure Laterality Date  . Urethectomy  04/2007    PROCEDURE:  Partial urethrectomy.    Family History  Problem Relation Age of Onset  . Heart failure Father   . Heart disease Father     No Known Allergies  Current Outpatient Prescriptions on File Prior to Visit  Medication Sig Dispense Refill  . amLODipine (NORVASC) 5 MG tablet TAKE 1 TABLET EVERY DAY  90 tablet  1  . aspirin 81 MG tablet Take 81 mg by mouth daily.        . simvastatin (ZOCOR) 40 MG tablet TAKE 1 TABLET EVERY DAY  90 tablet  1  . tadalafil (CIALIS) 20 MG tablet Take 1 tablet (20 mg total) by mouth daily as needed.  10 tablet  5   No current facility-administered medications on file prior to visit.     patient denies chest pain, shortness of breath, orthopnea. Denies lower extremity edema, abdominal pain, change in appetite, change in bowel movements. Patient denies rashes, musculoskeletal complaints. No other specific complaints in a complete review of systems.   BP 110/70  Pulse 80  Temp(Src) 98.4 F (36.9 C) (Oral)  Wt 284 lb (128.822 kg)  SpO2 98%   well-developed well-nourished male in no acute distress. HEENT exam atraumatic, normocephalic, neck supple  without jugular venous distention. Chest clear to auscultation cardiac exam S1-S2 are regular. Abdominal exam overweight with bowel sounds, soft and nontender. Extremities no edema. Neurologic exam is alert with a normal gait.

## 2013-10-25 NOTE — Progress Notes (Signed)
Quick Note:  Called and spoke with pt and pt is aware. ______ 

## 2013-12-05 DIAGNOSIS — L723 Sebaceous cyst: Secondary | ICD-10-CM | POA: Diagnosis not present

## 2013-12-05 DIAGNOSIS — L219 Seborrheic dermatitis, unspecified: Secondary | ICD-10-CM | POA: Diagnosis not present

## 2013-12-05 DIAGNOSIS — L821 Other seborrheic keratosis: Secondary | ICD-10-CM | POA: Diagnosis not present

## 2013-12-05 DIAGNOSIS — L259 Unspecified contact dermatitis, unspecified cause: Secondary | ICD-10-CM | POA: Diagnosis not present

## 2014-01-03 ENCOUNTER — Encounter: Payer: Self-pay | Admitting: Internal Medicine

## 2014-01-03 ENCOUNTER — Ambulatory Visit (INDEPENDENT_AMBULATORY_CARE_PROVIDER_SITE_OTHER): Payer: Medicare Other | Admitting: Internal Medicine

## 2014-01-03 VITALS — BP 150/86 | HR 105 | Temp 98.8°F | Resp 20 | Ht 74.0 in | Wt 282.0 lb

## 2014-01-03 DIAGNOSIS — I10 Essential (primary) hypertension: Secondary | ICD-10-CM | POA: Diagnosis not present

## 2014-01-03 DIAGNOSIS — R300 Dysuria: Secondary | ICD-10-CM

## 2014-01-03 DIAGNOSIS — R350 Frequency of micturition: Secondary | ICD-10-CM | POA: Diagnosis not present

## 2014-01-03 LAB — POCT URINALYSIS DIPSTICK
Bilirubin, UA: NEGATIVE
GLUCOSE UA: NEGATIVE
Ketones, UA: NEGATIVE
NITRITE UA: NEGATIVE
PH UA: 6
Protein, UA: 300
Spec Grav, UA: 1.02
UROBILINOGEN UA: 0.2

## 2014-01-03 LAB — GLUCOSE, POCT (MANUAL RESULT ENTRY): POC Glucose: 129 mg/dl — AB (ref 70–99)

## 2014-01-03 MED ORDER — CIPROFLOXACIN HCL 500 MG PO TABS: 500.0000 mg | ORAL_TABLET | Freq: Two times a day (BID) | ORAL | Status: DC

## 2014-01-03 NOTE — Progress Notes (Signed)
Pre-visit discussion using our clinic review tool. No additional management support is needed unless otherwise documented below in the visit note.  

## 2014-01-03 NOTE — Patient Instructions (Signed)
Maintain hydration by drinking small amounts of clear fluids frequently.  Take your antibiotic as prescribed until ALL of it is gone, but stop if you develop a rash, swelling, or any side effects of the medication.  Contact our office as soon as possible if  there are side effects of the medication.

## 2014-01-03 NOTE — Progress Notes (Signed)
Subjective:    Patient ID: Charles Castillo, male    DOB: 1934/11/13, 80 y.o.   MRN: [id number]  HPI  80 year old patient who presents complaining of mild low back pain dysuria and frequency of 2-3 days duration. He does have a history of impaired glucose tolerance. No fever chills or flank pain  Past Medical History  Diagnosis Date  . Hypertension   . Arthritis   . Squamous cell carcinoma     penile and urethral    History   Social History  . Marital Status: Married    Spouse Name: N/A    Number of Children: N/A  . Years of Education: N/A   Occupational History  . Not on file.   Social History Main Topics  . Smoking status: Former Smoker    Quit date: 12/19/1968  . Smokeless tobacco: Never Used  . Alcohol Use: No  . Drug Use: No  . Sexual Activity: Not on file   Other Topics Concern  . Not on file   Social History Narrative  . No narrative on file    Past Surgical History  Procedure Laterality Date  . Urethectomy  04/2007    PROCEDURE:  Partial urethrectomy.    Family History  Problem Relation Age of Onset  . Heart failure Father   . Heart disease Father     No Known Allergies  Current Outpatient Prescriptions on File Prior to Visit  Medication Sig Dispense Refill  . amLODipine (NORVASC) 5 MG tablet TAKE 1 TABLET EVERY DAY  90 tablet  1  . aspirin 81 MG tablet Take 81 mg by mouth daily.        . simvastatin (ZOCOR) 40 MG tablet TAKE 1 TABLET EVERY DAY  90 tablet  1  . tadalafil (CIALIS) 20 MG tablet Take 1 tablet (20 mg total) by mouth daily as needed.  10 tablet  5   No current facility-administered medications on file prior to visit.    BP 150/86  Pulse 105  Temp(Src) 98.8 F (37.1 C) (Oral)  Resp 20  Ht 6\' 2"  (1.88 m)  Wt 282 lb (127.914 kg)  BMI 36.19 kg/m2  SpO2 95%       Review of Systems  Constitutional: Negative for fever, chills, appetite change and fatigue.  HENT: Negative for congestion, dental problem, ear pain, hearing  loss, sore throat, tinnitus, trouble swallowing and voice change.   Eyes: Negative for pain, discharge and visual disturbance.  Respiratory: Negative for cough, chest tightness, wheezing and stridor.   Cardiovascular: Negative for chest pain, palpitations and leg swelling.  Gastrointestinal: Negative for nausea, vomiting, abdominal pain, diarrhea, constipation, blood in stool and abdominal distention.  Endocrine: Positive for polyuria.  Genitourinary: Positive for dysuria and frequency. Negative for urgency, hematuria, flank pain, discharge, difficulty urinating and genital sores.  Musculoskeletal: Positive for back pain. Negative for arthralgias, gait problem, joint swelling, myalgias and neck stiffness.  Skin: Negative for rash.  Neurological: Negative for dizziness, syncope, speech difficulty, weakness, numbness and headaches.  Hematological: Negative for adenopathy. Does not bruise/bleed easily.  Psychiatric/Behavioral: Negative for behavioral problems and dysphoric mood. The patient is not nervous/anxious.        Objective:   Physical Exam  Constitutional: He appears well-developed and well-nourished. No distress.  Blood pressure 140/80          Assessment & Plan:   Dysuria and frequency. Urinalysis reviewed and revealed the only trace pyuria. Random blood sugar 129. Will treat with Cipro 500  twice a day and observe clinical response Hypertension stable

## 2014-01-04 ENCOUNTER — Telehealth: Payer: Self-pay | Admitting: Internal Medicine

## 2014-01-04 NOTE — Telephone Encounter (Signed)
Relevant patient education assigned to patient using Emmi. ° °

## 2014-01-06 ENCOUNTER — Other Ambulatory Visit: Payer: Self-pay | Admitting: *Deleted

## 2014-01-06 MED ORDER — TAMSULOSIN HCL 0.4 MG PO CAPS: 0.4000 mg | ORAL_CAPSULE | Freq: Every day | ORAL | Status: DC

## 2014-01-15 ENCOUNTER — Other Ambulatory Visit: Payer: Self-pay | Admitting: Internal Medicine

## 2014-01-20 ENCOUNTER — Telehealth: Payer: Self-pay | Admitting: Internal Medicine

## 2014-01-20 MED ORDER — SIMVASTATIN 40 MG PO TABS: ORAL_TABLET | ORAL | Status: DC

## 2014-01-20 NOTE — Telephone Encounter (Signed)
Pt needs re-fill simvastatin (ZOCOR) 40 MG tablet [facility].  The request has been sent in by [facility].

## 2014-01-20 NOTE — Telephone Encounter (Signed)
rx sent in electronically 

## 2014-01-22 ENCOUNTER — Telehealth: Payer: Self-pay | Admitting: Internal Medicine

## 2014-01-22 MED ORDER — AMLODIPINE BESYLATE 5 MG PO TABS: ORAL_TABLET | ORAL | Status: DC

## 2014-01-22 NOTE — Telephone Encounter (Signed)
Patient needs an appt.  Schedule office visit and we can give him enough to get to his appt.  Last seen 12/22/12 by [name]

## 2014-01-22 NOTE — Telephone Encounter (Signed)
[facility] requesting new script for amLODipine (NORVASC) 5 MG tablet

## 2014-01-22 NOTE — Telephone Encounter (Signed)
My mistake, rx sent in electronically to [facility]

## 2014-01-22 NOTE — Telephone Encounter (Signed)
Charles Castillo pt last saw md was 10-22-2013 not 12-22-2012. Pt has sch appt for 05-05-14

## 2014-05-05 ENCOUNTER — Ambulatory Visit (INDEPENDENT_AMBULATORY_CARE_PROVIDER_SITE_OTHER): Payer: Medicare Other | Admitting: Internal Medicine

## 2014-05-05 ENCOUNTER — Encounter: Payer: Self-pay | Admitting: Internal Medicine

## 2014-05-05 VITALS — BP 130/74 | HR 78 | Temp 98.4°F | Wt 279.0 lb

## 2014-05-05 DIAGNOSIS — I10 Essential (primary) hypertension: Secondary | ICD-10-CM | POA: Diagnosis not present

## 2014-05-05 DIAGNOSIS — E785 Hyperlipidemia, unspecified: Secondary | ICD-10-CM

## 2014-05-05 DIAGNOSIS — G4730 Sleep apnea, unspecified: Secondary | ICD-10-CM

## 2014-05-05 DIAGNOSIS — D0910 Carcinoma in situ of unspecified urinary organ: Secondary | ICD-10-CM

## 2014-05-05 DIAGNOSIS — N259 Disorder resulting from impaired renal tubular function, unspecified: Secondary | ICD-10-CM | POA: Diagnosis not present

## 2014-05-05 LAB — HEPATIC FUNCTION PANEL
ALBUMIN: 3.8 g/dL (ref 3.5–5.2)
ALT: 22 U/L (ref 0–53)
AST: 21 U/L (ref 0–37)
Alkaline Phosphatase: 56 U/L (ref 39–117)
Bilirubin, Direct: 0.1 mg/dL (ref 0.0–0.3)
TOTAL PROTEIN: 7.4 g/dL (ref 6.0–8.3)
Total Bilirubin: 0.6 mg/dL (ref 0.2–1.2)

## 2014-05-05 LAB — CBC WITH DIFFERENTIAL/PLATELET
BASOS ABS: 0.1 10*3/uL (ref 0.0–0.1)
BASOS PCT: 0.8 % (ref 0.0–3.0)
Eosinophils Absolute: 0.2 10*3/uL (ref 0.0–0.7)
Eosinophils Relative: 3.3 % (ref 0.0–5.0)
HCT: 47.7 % (ref 39.0–52.0)
HEMOGLOBIN: 16.1 g/dL (ref 13.0–17.0)
Lymphocytes Relative: 27.9 % (ref 12.0–46.0)
Lymphs Abs: 1.8 10*3/uL (ref 0.7–4.0)
MCHC: 33.8 g/dL (ref 30.0–36.0)
MCV: 90.7 fl (ref 78.0–100.0)
MONO ABS: 0.7 10*3/uL (ref 0.1–1.0)
Monocytes Relative: 11.1 % (ref 3.0–12.0)
NEUTROS ABS: 3.7 10*3/uL (ref 1.4–7.7)
NEUTROS PCT: 56.9 % (ref 43.0–77.0)
Platelets: 204 10*3/uL (ref 150.0–400.0)
RBC: 5.26 Mil/uL (ref 4.22–5.81)
RDW: 14.5 % (ref 11.5–15.5)
WBC: 6.5 10*3/uL (ref 4.0–10.5)

## 2014-05-05 LAB — LIPID PANEL
Cholesterol: 181 mg/dL (ref 0–200)
HDL: 49.9 mg/dL (ref >39.00)
LDL Cholesterol: 119 mg/dL — ABNORMAL HIGH (ref 0–99)
TRIGLYCERIDES: 60 mg/dL (ref 0.0–149.0)
Total CHOL/HDL Ratio: 4
VLDL: 12 mg/dL (ref 0.0–40.0)

## 2014-05-05 LAB — BASIC METABOLIC PANEL
BUN: 23 mg/dL (ref 6–23)
CALCIUM: 9.4 mg/dL (ref 8.4–10.5)
CHLORIDE: 107 meq/L (ref 96–112)
CO2: 24 meq/L (ref 19–32)
CREATININE: 1.8 mg/dL — AB (ref 0.4–1.5)
GFR: 39.53 mL/min — ABNORMAL LOW (ref >60.00)
GLUCOSE: 129 mg/dL — AB (ref 70–99)
Potassium: 4.5 mEq/L (ref 3.5–5.1)
Sodium: 139 mEq/L (ref 135–145)

## 2014-05-05 LAB — TSH: TSH: 2.21 u[IU]/mL (ref 0.35–4.50)

## 2014-05-05 NOTE — Progress Notes (Signed)
Pre visit review using our clinic review tool, if applicable. No additional management support is needed unless otherwise documented below in the visit note. 

## 2014-05-05 NOTE — Progress Notes (Signed)
htn- tolerating meds  Renal insuff- needs f/u labs  SSCa of penis- no longer under care of urology  Lipids- tolerating meds  Past Medical History  Diagnosis Date  . Hypertension   . Arthritis   . Squamous cell carcinoma     penile and urethral    History   Social History  . Marital Status: Married    Spouse Name: N/A    Number of Children: N/A  . Years of Education: N/A   Occupational History  . Not on file.   Social History Main Topics  . Smoking status: Former Smoker    Quit date: 12/19/1968  . Smokeless tobacco: Never Used  . Alcohol Use: No  . Drug Use: No  . Sexual Activity: Not on file   Other Topics Concern  . Not on file   Social History Narrative  . No narrative on file    Past Surgical History  Procedure Laterality Date  . Urethectomy  04/2007    PROCEDURE:  Partial urethrectomy.    Family History  Problem Relation Age of Onset  . Heart failure Father   . Heart disease Father     No Known Allergies  Current Outpatient Prescriptions on File Prior to Visit  Medication Sig Dispense Refill  . amLODipine (NORVASC) 5 MG tablet TAKE 1 TABLET EVERY DAY  90 tablet  1  . aspirin 81 MG tablet Take 81 mg by mouth daily.        . simvastatin (ZOCOR) 40 MG tablet TAKE 1 TABLET EVERY DAY  90 tablet  1  . tadalafil (CIALIS) 20 MG tablet Take 1 tablet (20 mg total) by mouth daily as needed.  10 tablet  5   No current facility-administered medications on file prior to visit.     patient denies chest pain, shortness of breath, orthopnea. Denies lower extremity edema, abdominal pain, change in appetite, change in bowel movements. Patient denies rashes, musculoskeletal complaints. No other specific complaints in a complete review of systems.   BP 130/74  Pulse 78  Temp(Src) 98.4 F (36.9 C) (Oral)  Wt 279 lb (126.554 kg)  SpO2 98% Keep distress. HEENT exam: Atraumatic, normocephalic symmetric her muscles are intact. Chest is clear to  auscultation Cardiac exam S1-S2 are regular Abdominal exam obese, and bowel sounds, soft Extremities: No edema  HYPERTENSION BP Readings from Last 3 Encounters:  05/05/14 130/74  01/03/14 150/86  10/22/13 110/70   Adequately controlled. Continue current medications.  HYPERLIPIDEMIA We'll check labs today.  SLEEP APNEA He uses CPAP with good results.  RENAL INSUFFICIENCY Has been stable but needs

## 2014-05-07 NOTE — Assessment & Plan Note (Signed)
We'll check labs today.

## 2014-05-07 NOTE — Assessment & Plan Note (Signed)
BP Readings from Last 3 Encounters:  05/05/14 130/74  01/03/14 150/86  10/22/13 110/70   Adequately controlled. Continue current medications.

## 2014-05-07 NOTE — Assessment & Plan Note (Signed)
Has been stable but needs

## 2014-05-07 NOTE — Assessment & Plan Note (Signed)
He uses CPAP with good results.

## 2014-06-17 ENCOUNTER — Encounter: Payer: Self-pay | Admitting: Internal Medicine

## 2014-07-11 ENCOUNTER — Encounter: Payer: Self-pay | Admitting: Family Medicine

## 2014-07-11 ENCOUNTER — Ambulatory Visit (INDEPENDENT_AMBULATORY_CARE_PROVIDER_SITE_OTHER): Payer: Medicare Other | Admitting: Family Medicine

## 2014-07-11 VITALS — BP 130/74 | HR 81 | Temp 98.3°F | Wt 281.0 lb

## 2014-07-11 DIAGNOSIS — H6120 Impacted cerumen, unspecified ear: Secondary | ICD-10-CM

## 2014-07-11 DIAGNOSIS — H6121 Impacted cerumen, right ear: Secondary | ICD-10-CM

## 2014-07-11 NOTE — Progress Notes (Signed)
   Subjective:    Patient ID: Charles Castillo, male    DOB: 1934/05/10, 80 y.o.   MRN: [id number]  HPI  Right ear fullness. Noted past couple days. No pain. No drainage. Possibly muffled hearing. No dizziness. No nasal congestion. Left ear is normal.  No alleviating or exacerbating factors.  Past Medical History  Diagnosis Date  . Hypertension   . Arthritis   . Squamous cell carcinoma     penile and urethral   Past Surgical History  Procedure Laterality Date  . Urethectomy  04/2007    PROCEDURE:  Partial urethrectomy.    reports that he quit smoking about 45 years ago. He has never used smokeless tobacco. He reports that he does not drink alcohol or use illicit drugs. family history includes Heart disease in his father; Heart failure in his father. No Known Allergies   Review of Systems  Constitutional: Negative for fever and chills.  HENT: Negative for ear discharge.        Objective:   Physical Exam  Constitutional: He appears well-developed and well-nourished. No distress.  HENT:  Right ear occluded with cerumen. Minimal cerumen left canal  Cardiovascular: Normal rate and regular rhythm.           Assessment & Plan:  Cerumen impaction right ear. Removed partially with curette. Remainder required irrigation.  Pt symptomatically improved afterwards.

## 2014-07-11 NOTE — Progress Notes (Signed)
Pre visit review using our clinic review tool, if applicable. No additional management support is needed unless otherwise documented below in the visit note. 

## 2014-07-24 ENCOUNTER — Other Ambulatory Visit: Payer: Self-pay | Admitting: Internal Medicine

## 2014-09-03 ENCOUNTER — Other Ambulatory Visit (INDEPENDENT_AMBULATORY_CARE_PROVIDER_SITE_OTHER): Payer: Medicare Other

## 2014-09-03 DIAGNOSIS — I10 Essential (primary) hypertension: Secondary | ICD-10-CM | POA: Diagnosis not present

## 2014-09-03 LAB — BASIC METABOLIC PANEL
BUN: 26 mg/dL — AB (ref 6–23)
CALCIUM: 9.5 mg/dL (ref 8.4–10.5)
CHLORIDE: 109 meq/L (ref 96–112)
CO2: 23 mEq/L (ref 19–32)
CREATININE: 1.8 mg/dL — AB (ref 0.4–1.5)
GFR: 38.01 mL/min — ABNORMAL LOW (ref >60.00)
GLUCOSE: 152 mg/dL — AB (ref 70–99)
Potassium: 4.6 mEq/L (ref 3.5–5.1)
Sodium: 140 mEq/L (ref 135–145)

## 2014-09-15 ENCOUNTER — Encounter: Payer: Self-pay | Admitting: Family Medicine

## 2014-09-15 ENCOUNTER — Ambulatory Visit (INDEPENDENT_AMBULATORY_CARE_PROVIDER_SITE_OTHER): Payer: Medicare Other | Admitting: Family Medicine

## 2014-09-15 VITALS — BP 120/74 | HR 80 | Temp 98.8°F | Wt 277.0 lb

## 2014-09-15 DIAGNOSIS — Z23 Encounter for immunization: Secondary | ICD-10-CM

## 2014-09-15 DIAGNOSIS — E119 Type 2 diabetes mellitus without complications: Secondary | ICD-10-CM | POA: Diagnosis not present

## 2014-09-15 DIAGNOSIS — E1122 Type 2 diabetes mellitus with diabetic chronic kidney disease: Secondary | ICD-10-CM | POA: Insufficient documentation

## 2014-09-15 DIAGNOSIS — Z87891 Personal history of nicotine dependence: Secondary | ICD-10-CM | POA: Insufficient documentation

## 2014-09-15 DIAGNOSIS — N259 Disorder resulting from impaired renal tubular function, unspecified: Secondary | ICD-10-CM

## 2014-09-15 DIAGNOSIS — I10 Essential (primary) hypertension: Secondary | ICD-10-CM | POA: Diagnosis not present

## 2014-09-15 DIAGNOSIS — N529 Male erectile dysfunction, unspecified: Secondary | ICD-10-CM | POA: Insufficient documentation

## 2014-09-15 DIAGNOSIS — N184 Chronic kidney disease, stage 4 (severe): Secondary | ICD-10-CM

## 2014-09-15 MED ORDER — LISINOPRIL 10 MG PO TABS
10.0000 mg | ORAL_TABLET | Freq: Every day | ORAL | Status: DC
Start: 2014-09-15 — End: 2014-10-03

## 2014-09-15 NOTE — Progress Notes (Signed)
Charles Reddish, MD Phone: [phone]  Subjective:  Patient presents today to establish care with me as their new primary care provider. Patient was formerly a patient of Dr. Leanne Chang. Chief complaint-noted.   DIABETES Type II-new diagnosis based off last 2 CBGs >100 as well as a1c 5/6 above 6.5. Diet controlled.   Lab Results  Component Value Date   HGBA1C 6.5 10/22/2013   HGBA1C 6.6* 04/23/2013   HGBA1C 6.5 10/10/2012   Medications taking and tolerating-no meds Blood Sugars per patient-not taking Diet-tries to eat healthy Regular Exercise-remains active On Aspirin-yes On statin-yes Daily foot monitoring-no, advised  ROS- Denies Vision changes, feet or hand numbness/pain/tingling. Denies Hypoglycemia symptoms (shaky, sweaty, hungry, weak anxious, tremor, palpitations, confusion, behavior change).   Hypertension-stable CKD Stage III-stable BP Readings from Last 3 Encounters:  09/15/14 120/74  07/11/14 130/74  05/05/14 130/74  on amlodipine but on nothing for renal protection.  Compliant with medications-yes without side effects, amlodipine ROS-Denies any CP, HA, SOB, blurry vision, LE edema.   The following were reviewed and entered/updated in epic: Past Medical History  Diagnosis Date  . Hypertension   . Arthritis   . Squamous cell carcinoma     penile and urethral   Patient Active Problem List   Diagnosis Date Noted  . Diabetes mellitus 09/15/2014    Priority: High  . Erectile dysfunction 09/15/2014    Priority: Medium  . Urethral Cancer s/p excision.  11/04/2009    Priority: Medium  . CKD (chronic kidney disease), stage III 03/24/2008    Priority: Medium  . HYPERLIPIDEMIA 08/27/2007    Priority: Medium  . HYPERTENSION 07/03/2007    Priority: Medium  . SLEEP APNEA 07/03/2007    Priority: Medium  . Overweight(278.02) 10/10/2012    Priority: Low  . OSTEOARTHRITIS 07/03/2007    Priority: Low   Past Surgical History  Procedure Laterality Date  . Urethectomy   04/2007    PROCEDURE:  Partial urethrectomy.    Family History  Problem Relation Age of Onset  . Heart failure Father     CHF, no history MI    Medications- reviewed and updated Current Outpatient Prescriptions  Medication Sig Dispense Refill  . aspirin 81 MG tablet Take 81 mg by mouth daily.        . DiphenhydrAMINE HCl, Sleep, (SOMINEX PO) Take by mouth Nightly.      . simvastatin (ZOCOR) 40 MG tablet TAKE 1 TABLET EVERY DAY  90 tablet  1  . lisinopril (PRINIVIL,ZESTRIL) 10 MG tablet Take 1 tablet (10 mg total) by mouth daily.  90 tablet  3  . tadalafil (CIALIS) 20 MG tablet Take 1 tablet (20 mg total) by mouth daily as needed.  10 tablet  5   No current facility-administered medications for this visit.    Allergies-reviewed and updated No Known Allergies  History   Social History  . Marital Status: Married    Spouse Name: N/A    Number of Children: N/A  . Years of Education: N/A   Social History Main Topics  . Smoking status: Former Smoker -- 1.00 packs/day for 20 years    Types: Cigarettes    Quit date: 12/19/1968  . Smokeless tobacco: Never Used  . Alcohol Use: No  . Drug Use: No  . Sexual Activity: Not on file   Other Topics Concern  . Not on file   Social History Narrative   Married 1999. 5 kids from previous marriage. 2 step kids. 19 grandkids. 9 greatgrandchildren.  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       Wife-hcpoa and this document details wishes. Patient does not want long term life preserving measures but would want resuscitation and intubation if needed.     ROS--See HPI   Objective: BP 120/74  Pulse 80  Temp(Src) 98.8 F (37.1 C)  Wt 277 lb (125.646 kg) Gen: NAD, resting comfortably, moves easily to table HEENT: Mucous membranes are moist. Oropharynx normal. Good dentition only a few caps/crowns Neck: no thyromegaly CV: RRR no murmurs rubs or  gallops Lungs: CTAB no crackles, wheeze, rhonchi Abdomen: soft/nontender/nondistended/normal bowel sounds.  Ext: no edema Skin: warm, dry, no rash Neuro: grossly normal, moves all extremities, PERRLA DM foot exam: normal, 2+ PT pulses   Assessment/Plan:  Diabetes mellitus Informed patient of diagnosis. Counseling provided. Diet controlled-check a1c at least every 6 months and check in 2 weeks with bmet.   HYPERTENSION Change to lisinopril due to CKD. Well controlled. Remind patient with lab results to send me BP log.   CKD (chronic kidney disease), stage III Stable. Change to lisinopril for renal protection especially given DM. Check Cr 2 weeks after start to evaluate creatinine increase. Patient still does have 80 pills of amlodipine left if need to switch back.    Future, does not have to be fasting Orders Placed This Encounter  Procedures  . Hemoglobin A1c    [place]    Standing Status: Future     Number of Occurrences:      Standing Expiration Date: 10/18/2014  . Basic metabolic panel        Standing Status: Future     Number of Occurrences:      Standing Expiration Date: 10/18/2014    Meds ordered this encounter  Medications  . lisinopril (PRINIVIL,ZESTRIL) 10 MG tablet    Sig: Take 1 tablet (10 mg total) by mouth daily.    Dispense:  90 tablet    Refill:  3

## 2014-09-15 NOTE — Assessment & Plan Note (Signed)
Informed patient of diagnosis. Counseling provided. Diet controlled-check a1c at least every 6 months and check in 2 weeks with bmet.

## 2014-09-15 NOTE — Assessment & Plan Note (Signed)
Stable. Change to lisinopril for renal protection especially given DM. Check Cr 2 weeks after start to evaluate creatinine increase. Patient still does have 80 pills of amlodipine left if need to switch back.

## 2014-09-15 NOTE — Patient Instructions (Addendum)
Great to meet you!   Let's get a copy of your POA if you don't mind on file here.   Blood pressure/kidney disease. Stop amlodipine once you get lisinopril. Take lisinopril once a day. 2 weeks after you start it, schedule lab visit for creatinine and a1c.   Technically you do have diabetes but it is diet controlled. Great job on weight loss! Health Maintenance Due  Topic Date Due  . Ophthalmology Exam -tell them you have diabetes and send me a copy please 10/15/1944

## 2014-09-15 NOTE — Assessment & Plan Note (Addendum)
Change to lisinopril due to CKD. Well controlled. Remind patient with lab results to send me BP log.

## 2014-10-03 ENCOUNTER — Encounter: Payer: Self-pay | Admitting: Family Medicine

## 2014-10-03 ENCOUNTER — Other Ambulatory Visit (INDEPENDENT_AMBULATORY_CARE_PROVIDER_SITE_OTHER): Payer: Medicare Other

## 2014-10-03 DIAGNOSIS — N259 Disorder resulting from impaired renal tubular function, unspecified: Secondary | ICD-10-CM | POA: Diagnosis not present

## 2014-10-03 DIAGNOSIS — E119 Type 2 diabetes mellitus without complications: Secondary | ICD-10-CM | POA: Diagnosis not present

## 2014-10-03 LAB — BASIC METABOLIC PANEL
BUN: 31 mg/dL — ABNORMAL HIGH (ref 6–23)
CALCIUM: 10 mg/dL (ref 8.4–10.5)
CO2: 26 mEq/L (ref 19–32)
CREATININE: 2 mg/dL — AB (ref 0.4–1.5)
Chloride: 108 mEq/L (ref 96–112)
GFR: 34.32 mL/min — ABNORMAL LOW (ref >60.00)
GLUCOSE: 147 mg/dL — AB (ref 70–99)
Potassium: 5.3 mEq/L — ABNORMAL HIGH (ref 3.5–5.1)
SODIUM: 141 meq/L (ref 135–145)

## 2014-10-03 LAB — HEMOGLOBIN A1C: HEMOGLOBIN A1C: 6.2 % (ref 4.6–6.5)

## 2014-10-20 DIAGNOSIS — H26493 Other secondary cataract, bilateral: Secondary | ICD-10-CM | POA: Diagnosis not present

## 2014-10-20 LAB — HM DIABETES EYE EXAM

## 2014-10-22 ENCOUNTER — Other Ambulatory Visit: Payer: Self-pay | Admitting: Internal Medicine

## 2014-10-24 ENCOUNTER — Telehealth: Payer: Self-pay | Admitting: Family Medicine

## 2014-10-24 NOTE — Telephone Encounter (Signed)
Latonya please forward all refill request to [facility]. Thanks!

## 2014-10-24 NOTE — Telephone Encounter (Signed)
[place], [facility] [address] is requesting re-fill on simvastatin (ZOCOR) 40 MG tablet

## 2014-10-25 MED ORDER — SIMVASTATIN 40 MG PO TABS: ORAL_TABLET | ORAL | Status: DC

## 2014-10-25 NOTE — Telephone Encounter (Signed)
Medication refilled

## 2014-10-27 NOTE — Telephone Encounter (Signed)
It was sent directly to you in error Dr. Yong Channel.

## 2014-11-17 ENCOUNTER — Other Ambulatory Visit: Payer: Self-pay

## 2014-11-17 MED ORDER — TADALAFIL 20 MG PO TABS: 20.0000 mg | ORAL_TABLET | Freq: Every day | ORAL | Status: DC | PRN

## 2015-01-02 ENCOUNTER — Telehealth: Payer: Self-pay | Admitting: Family Medicine

## 2015-01-02 MED ORDER — AMLODIPINE BESYLATE 5 MG PO TABS: 5.0000 mg | ORAL_TABLET | Freq: Every day | ORAL | Status: DC

## 2015-01-02 MED ORDER — SIMVASTATIN 40 MG PO TABS: ORAL_TABLET | ORAL | Status: DC

## 2015-01-02 NOTE — Telephone Encounter (Signed)
Medications refilled to [facility] 

## 2015-01-02 NOTE — Telephone Encounter (Signed)
Pt request refill  90 days amLODipine (NORVASC) 5 MG tablet simvastatin (ZOCOR) 40 MG tablet Pt has changed insurance co and needs new rx fr new pharm [facility] [address] [facility]

## 2015-03-19 ENCOUNTER — Other Ambulatory Visit: Payer: Self-pay | Admitting: Family Medicine

## 2015-03-19 ENCOUNTER — Encounter: Payer: Self-pay | Admitting: Family Medicine

## 2015-03-19 ENCOUNTER — Ambulatory Visit (INDEPENDENT_AMBULATORY_CARE_PROVIDER_SITE_OTHER): Payer: Medicare Other | Admitting: Family Medicine

## 2015-03-19 VITALS — BP 120/60 | HR 76 | Temp 98.7°F | Ht 74.0 in | Wt 283.0 lb

## 2015-03-19 DIAGNOSIS — Z23 Encounter for immunization: Secondary | ICD-10-CM | POA: Diagnosis not present

## 2015-03-19 DIAGNOSIS — Z0000 Encounter for general adult medical examination without abnormal findings: Secondary | ICD-10-CM | POA: Diagnosis not present

## 2015-03-19 DIAGNOSIS — E119 Type 2 diabetes mellitus without complications: Secondary | ICD-10-CM | POA: Diagnosis not present

## 2015-03-19 DIAGNOSIS — E785 Hyperlipidemia, unspecified: Secondary | ICD-10-CM

## 2015-03-19 DIAGNOSIS — Z87891 Personal history of nicotine dependence: Secondary | ICD-10-CM

## 2015-03-19 DIAGNOSIS — I10 Essential (primary) hypertension: Secondary | ICD-10-CM

## 2015-03-19 LAB — POCT URINALYSIS DIPSTICK
BILIRUBIN UA: NEGATIVE
Glucose, UA: NEGATIVE
NITRITE UA: NEGATIVE
PH UA: 5.5
RBC UA: NEGATIVE
Spec Grav, UA: 1.025
Urobilinogen, UA: 0.2

## 2015-03-19 LAB — CBC
HEMATOCRIT: 48.6 % (ref 39.0–52.0)
Hemoglobin: 16.6 g/dL (ref 13.0–17.0)
MCHC: 34.2 g/dL (ref 30.0–36.0)
MCV: 89.4 fl (ref 78.0–100.0)
Platelets: 214 10*3/uL (ref 150.0–400.0)
RBC: 5.43 Mil/uL (ref 4.22–5.81)
RDW: 14.1 % (ref 11.5–15.5)
WBC: 7.2 10*3/uL (ref 4.0–10.5)

## 2015-03-19 LAB — COMPREHENSIVE METABOLIC PANEL
ALT: 19 U/L (ref 0–53)
AST: 19 U/L (ref 0–37)
Albumin: 4.1 g/dL (ref 3.5–5.2)
Alkaline Phosphatase: 65 U/L (ref 39–117)
BILIRUBIN TOTAL: 0.6 mg/dL (ref 0.2–1.2)
BUN: 25 mg/dL — ABNORMAL HIGH (ref 6–23)
CHLORIDE: 106 meq/L (ref 96–112)
CO2: 26 meq/L (ref 19–32)
CREATININE: 1.92 mg/dL — AB (ref 0.40–1.50)
Calcium: 10 mg/dL (ref 8.4–10.5)
GFR: 36.14 mL/min — AB (ref >60.00)
Glucose, Bld: 130 mg/dL — ABNORMAL HIGH (ref 70–99)
Potassium: 4.6 mEq/L (ref 3.5–5.1)
SODIUM: 139 meq/L (ref 135–145)
Total Protein: 7.9 g/dL (ref 6.0–8.3)

## 2015-03-19 LAB — LDL CHOLESTEROL, DIRECT: LDL DIRECT: 126 mg/dL

## 2015-03-19 LAB — MICROALBUMIN / CREATININE URINE RATIO
CREATININE, U: 191.3 mg/dL
MICROALB UR: 14.1 mg/dL — AB (ref 0.0–1.9)
Microalb Creat Ratio: 7.4 mg/g (ref 0.0–30.0)

## 2015-03-19 LAB — HEMOGLOBIN A1C: HEMOGLOBIN A1C: 6.4 % (ref 4.6–6.5)

## 2015-03-19 LAB — TSH: TSH: 3.71 u[IU]/mL (ref 0.35–4.50)

## 2015-03-19 MED ORDER — ATORVASTATIN CALCIUM 40 MG PO TABS: 40.0000 mg | ORAL_TABLET | Freq: Every day | ORAL | Status: DC

## 2015-03-19 NOTE — Patient Instructions (Addendum)
Received final pneumonia shot today [id number])  You are doing very well.   Update yearly bloodwork  See you in 6 months

## 2015-03-19 NOTE — Progress Notes (Signed)
Charles Reddish, MD Phone: [phone]  Subjective:  Patient presents today for their annual wellness visit.    Preventive Screening-Counseling & Management  Smoking Status: Former Smoker 20 pack years quit 1970. Outside of range AAA screen and regardless previously declinedand lung cancer screening Second Hand Smoking status: No smokers in home  Risk Factors Regular exercise: walking 2-3x a week for 30-45 minutes Diet: reasonable  Fall Risk: None   Cardiac risk factors:  advanced age Hyperlipidemia  diabetes.  Family History: CHF in father but no history MI/CAD   Depression Screen None. PHQ2 0   Activities of Daily Living Independent ADLs and IADLs   Hearing Difficulties: -patient declines  Cognitive Testing No reported trouble.   Normal 3 word recall  List the Names of Other Physician/Practitioners you currently use: 1.Previously saw Dr. Roni Bread for urethral cancer and has been released-yearly UA with [place] 2. [facility]-they did not note that patient was a diabetic. He told the nursing staff, we will await next visit 10/2015 and have called their office to inform.   Immunization History  Administered Date(s) Administered  . Influenza Split 09/20/2012  . Influenza Whole 12/19/2005, 09/30/2009, 09/10/2010  . Influenza,inj,Quad PF,36+ Mos 09/15/2014  . Influenza,inj,Quad PF,6-35 Mos 09/18/2013  . Pneumococcal Conjugate-13 03/19/2015  . Pneumococcal Polysaccharide-23 09/18/2004  . Td 11/01/2010   Required Immunizations needed today : received Prevnar today  Screening tests- up to date Health Maintenance Due  Topic Date Due  . OPHTHALMOLOGY EXAM - as above 10/15/1944   ROS- No pertinent positives discovered in course of AWV No chest pain, shortness of breath, nausea, vomiting, diarrhea, constipation.   The following were reviewed and entered/updated in epic: Past Medical History  Diagnosis Date  . Hypertension   . Arthritis     . Squamous cell carcinoma     penile and urethral   Patient Active Problem List   Diagnosis Date Noted  . Diabetes mellitus 09/15/2014    Priority: High  . Erectile dysfunction 09/15/2014    Priority: Medium  . Urethral Cancer s/p excision.  11/04/2009    Priority: Medium  . CKD (chronic kidney disease), stage III 03/24/2008    Priority: Medium  . HYPERLIPIDEMIA 08/27/2007    Priority: Medium  . Essential hypertension 07/03/2007    Priority: Medium  . SLEEP APNEA 07/03/2007    Priority: Medium  . Former smoker 09/15/2014    Priority: Low  . Obesity 10/10/2012    Priority: Low  . Osteoarthritis 07/03/2007    Priority: Low   Past Surgical History  Procedure Laterality Date  . Urethectomy  04/2007    PROCEDURE:  Partial urethrectomy.    Family History  Problem Relation Age of Onset  . Heart failure Father     CHF, no history MI    Medications- reviewed and updated Current Outpatient Prescriptions  Medication Sig Dispense Refill  . amLODipine (NORVASC) 5 MG tablet Take 1 tablet (5 mg total) by mouth daily. 90 tablet 1  . aspirin 81 MG tablet Take 81 mg by mouth daily.      . DiphenhydrAMINE HCl, Sleep, (SOMINEX PO) Take by mouth Nightly.    . simvastatin (ZOCOR) 40 MG tablet TAKE 1 TABLET EVERY DAY 90 tablet 1  . tadalafil (CIALIS) 20 MG tablet Take 1 tablet (20 mg total) by mouth daily as needed. (Patient not taking: Reported on 03/19/2015) 30 tablet 3   No current facility-administered medications for this visit.    Allergies-reviewed and updated No  Known Allergies  History   Social History  . Marital Status: Married    Spouse Name: N/A  . Number of Children: N/A  . Years of Education: N/A   Social History Main Topics  . Smoking status: Former Smoker -- 1.00 packs/day for 20 years    Types: Cigarettes    Quit date: 12/19/1968  . Smokeless tobacco: Never Used  . Alcohol Use: No  . Drug Use: No  . Sexual Activity: Not on file   Other Topics Concern   . Not on file   Social History Narrative   Married 1999. 5 kids from previous marriage. 2 step kids. 19 grandkids. 9 greatgrandchildren.      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       Wife-hcpoa and this document details wishes. Patient does not want long term life preserving measures but would want resuscitation and intubation if needed.     Objective: BP 120/60 mmHg  Pulse 76  Temp(Src) 98.7 F (37.1 C)  Ht 6\' 2"  (1.88 m)  Wt 283 lb (128.368 kg)  BMI 36.32 kg/m2 Gen: NAD, resting comfortably HEENT: Mucous membranes are moist. Oropharynx normal Neck: no thyromegaly CV: RRR no murmurs rubs or gallops Lungs: CTAB no crackles, wheeze, rhonchi Abdomen: soft/nontender/nondistended/normal bowel sounds. No rebound or guarding.  Ext: no edema Skin: warm, dry Neuro: grossly normal, moves all extremities, PERRLA  Assessment/Plan:  AWV doing well. Prevnar given.   DM- check a1c, diet controlled. Had eye exam but we called optho office as they did not perform diabetic retinopathy exam. They will complete this November.  HTN- controlled HLD- no clear indication for primary prevention -will not push for LDL <100, goal 130 more than reasonable Former smoker and history urethral cancer-check UA, refer back to urology if needed  Discussed at age, prostate cancer screening would be low yield, even if had prostate cancer, likely slow growing. He is aware there are more aggressive forms but we opted not to screen.   6 month f/u  Orders Placed This Encounter  Procedures  . Pneumococcal conjugate vaccine 13-valent  . Hemoglobin A1c    [place]  . Microalbumin / creatinine urine ratio    [place]  . CBC    [place]  . Comprehensive metabolic panel    [place]  . LDL cholesterol, direct    [place]  . TSH      . POCT urinalysis dipstick

## 2015-03-27 ENCOUNTER — Encounter: Payer: Self-pay | Admitting: Family Medicine

## 2015-06-03 DIAGNOSIS — D495 Neoplasm of unspecified behavior of other genitourinary organs: Secondary | ICD-10-CM | POA: Diagnosis not present

## 2015-06-03 DIAGNOSIS — N5201 Erectile dysfunction due to arterial insufficiency: Secondary | ICD-10-CM | POA: Diagnosis not present

## 2015-06-03 DIAGNOSIS — D099 Carcinoma in situ, unspecified: Secondary | ICD-10-CM | POA: Diagnosis not present

## 2015-06-08 ENCOUNTER — Other Ambulatory Visit: Payer: Self-pay | Admitting: Urology

## 2015-06-15 ENCOUNTER — Encounter (HOSPITAL_BASED_OUTPATIENT_CLINIC_OR_DEPARTMENT_OTHER): Payer: Self-pay | Admitting: *Deleted

## 2015-06-17 ENCOUNTER — Encounter (HOSPITAL_BASED_OUTPATIENT_CLINIC_OR_DEPARTMENT_OTHER): Payer: Self-pay | Admitting: *Deleted

## 2015-06-17 NOTE — Progress Notes (Signed)
NPO AFTER MN.  ARRIVE AT [id number].  NEEDS ISTAT AND EKG.

## 2015-06-17 NOTE — H&P (Signed)
Active Problems Problems  1. Benign prostatic hypertrophy without lower urinary tract symptoms (N40.0) 2. Erectile dysfunction due to arterial insufficiency (N52.01) 3. History of Neoplasm of urethra (D49.5) 4. Nocturia (R35.1) 5. History of Squamous cell carcinoma in situ (D09.9)  History of Present Illness Charles Castillo returns today in f/u. He has a history of squamous cell carcinoma in-site that required a partial urethrectomy in 4/08. He was last seen in 4/13. He returns now with the complaint of a 61mo history of some blood spotting on his shorts in the morning every 2-3 days. He has no blood in the voided urine. He is voiding without complaints.  He has a history of ED and is currently using Cialis.   Past Medical History Problems  1. History of Benign localized hyperplasia of prostate with urinary obstruction (N40.1,N13.8) 2. History of Cellulitis Of The Leg 3. History of hypercholesterolemia (Z86.39) 4. History of hyperlipidemia (Z86.39) 5. History of sleep apnea (Z87.09) 6. History of urethral stricture (Z87.448) 7. History of Neoplasm of urethra (D49.5) 8. History of Renal failure (N19) 9. History of Squamous cell carcinoma in situ (D09.9)  Surgical History Problems  1. History of Cystoscopy With Biopsy 2. History of Urethral Biopsy  Current Meds 1. Adult Aspirin Low Strength 81 MG TBDP;  Therapy: (Recorded:18Jan2008) to Recorded 2. AmLODIPine Besylate 5 MG Oral Tablet;  Therapy: 10Oct2011 to Recorded 3. Atorvastatin Calcium 40 MG Oral Tablet;  Therapy: (Recorded:15Jun2016) to Recorded 4. Cialis 20 MG Oral Tablet;  Therapy: (Recorded:04Apr2012) to Recorded 5. Multi-Vitamin Oral Tablet;  Therapy: (Recorded:18Jan2008) to Recorded 6. Night Time Sleep Aid TABS;  Therapy: (Recorded:15Jun2016) to Recorded 7. Sleep TABS;  Therapy: (Recorded:15Jun2016) to Recorded  Allergies Medication  1. No Known Drug Allergies  Family History Problems  1. Family history of  Congestive Heart Failure : Father  Social History Problems  1. Caffeine use (F15.90)   1 cup per day 2. Death in the family, father   age 51 (Heart Failure) 3. Death in the family, mother   age 79 ( Heart Failure) 4. Former smoker [phone]) 5. Marital History - Currently Married 6. No alcohol use 7. Number of children   2 sons & 3 daughters 7. Occupation:   retired 57. Retired 31. Tobacco Use   Quit 25 years ago; smoked 1 pack per day for 25 years  Past, family and social history reviewed and updated.   Review of Systems Genitourinary, constitutional, skin, eye, otolaryngeal, hematologic/lymphatic, cardiovascular, pulmonary, endocrine, musculoskeletal, gastrointestinal, neurological and psychiatric system(s) were reviewed and pertinent findings if present are noted and are otherwise negative.  Genitourinary: nocturia, incontinence and erectile dysfunction.    Vitals Vital Signs [Data Includes: Last 1 Day]  Recorded: [id number] 02:51PM  Height: 6 ft 2 in Weight: 275 lb  BMI Calculated: 35.31 BSA Calculated: 2.49 Blood Pressure: 148 / 78 Temperature: 97.4 F Heart Rate: 83  Physical Exam Constitutional: Well nourished and well developed . No acute distress.  ENT:. The ears and nose are normal in appearance.  Neck: The appearance of the neck is normal and no neck mass is present.  Pulmonary: No respiratory distress and normal respiratory rhythm and effort.  Cardiovascular: Heart rate and rhythm are normal . No peripheral edema.  Abdomen: The abdomen is obese. The abdomen is soft and nontender. No masses are palpated. No CVA tenderness. No hernias are palpable. No hepatosplenomegaly noted.  Genitourinary: Examination of the penis demonstrates no discharge, no masses and no lesions. The penis is circumcised (with a proximal meatus  on the distal ventral shaft. There is a 3-4 mm nodule that is worrisome for recurrent disease and there is also so erythema around the entire  meatus. ). The scrotum is without lesions. The right epididymis is palpably normal and non-tender. The left epididymis is palpably normal and non-tender. The right testis is non-tender and without masses. The left testis is non-tender and without masses.  Lymphatics: The femoral and inguinal nodes are not enlarged or tender.  Skin: Normal skin turgor, no visible rash and no visible skin lesions.  Neuro/Psych:. Mood and affect are appropriate.    Results/Data Urine [Data Includes: Last 1 Day]   [id number]  COLOR YELLOW   APPEARANCE CLEAR   SPECIFIC GRAVITY 1.015   pH 6.5   GLUCOSE NEG mg/dL  BILIRUBIN NEG   KETONE NEG mg/dL  BLOOD NEG   PROTEIN TRACE mg/dL  UROBILINOGEN 0.2 mg/dL  NITRITE NEG   LEUKOCYTE ESTERASE NEG    Assessment Assessed  1. History of Squamous cell carcinoma in situ (D09.9) 2. Erectile dysfunction due to arterial insufficiency (N52.01) 3. History of Neoplasm of urethra (D49.5)1   He may have a recurrent tumor at the meatus.    1 Amended By: Irine Seal; Jun 03 2015 3:30 PM EST  Plan PMH: Squamous cell carcinoma in situ  1. URINE CYTOLOGY; Status:Hold For - Specimen/Data Collection,Appointment;  Requested [id number];  2. Follow-up Schedule Surgery Office  Follow-up  Status: Hold For - Appointment   Requested for: [phone]  I am going to set him up for a cystoscopy and biopsy and may have to excise and refashion the meatus to completely excise the lesion. I discussed the risks of bleeding, infection, stricture, thrombotic events and anesthetic complications.  He is interested in the generic sildenafil for ED but I will hold off on dispensing that until with get the penile issue clarified.

## 2015-06-18 ENCOUNTER — Encounter (HOSPITAL_BASED_OUTPATIENT_CLINIC_OR_DEPARTMENT_OTHER)
Admission: RE | Disposition: A | Discharge status: Home / Self Care | Payer: Self-pay | Source: Ambulatory Visit | Attending: Urology

## 2015-06-18 ENCOUNTER — Ambulatory Visit (HOSPITAL_BASED_OUTPATIENT_CLINIC_OR_DEPARTMENT_OTHER): Payer: Medicare Other | Admitting: Anesthesiology

## 2015-06-18 ENCOUNTER — Ambulatory Visit (HOSPITAL_BASED_OUTPATIENT_CLINIC_OR_DEPARTMENT_OTHER)
Admission: RE | Admit: 2015-06-18 | Discharge: 2015-06-18 | Disposition: A | Discharge status: Home / Self Care | Payer: Medicare Other | Source: Ambulatory Visit | Attending: Urology | Admitting: Urology

## 2015-06-18 ENCOUNTER — Encounter (HOSPITAL_BASED_OUTPATIENT_CLINIC_OR_DEPARTMENT_OTHER): Payer: Self-pay | Admitting: *Deleted

## 2015-06-18 DIAGNOSIS — Z79899 Other long term (current) drug therapy: Secondary | ICD-10-CM | POA: Insufficient documentation

## 2015-06-18 DIAGNOSIS — Z8559 Personal history of malignant neoplasm of other urinary tract organ: Secondary | ICD-10-CM | POA: Insufficient documentation

## 2015-06-18 DIAGNOSIS — C609 Malignant neoplasm of penis, unspecified: Secondary | ICD-10-CM | POA: Diagnosis not present

## 2015-06-18 DIAGNOSIS — M1990 Unspecified osteoarthritis, unspecified site: Secondary | ICD-10-CM | POA: Insufficient documentation

## 2015-06-18 DIAGNOSIS — C680 Malignant neoplasm of urethra: Secondary | ICD-10-CM | POA: Diagnosis not present

## 2015-06-18 DIAGNOSIS — E780 Pure hypercholesterolemia: Secondary | ICD-10-CM | POA: Insufficient documentation

## 2015-06-18 DIAGNOSIS — N138 Other obstructive and reflux uropathy: Secondary | ICD-10-CM | POA: Diagnosis not present

## 2015-06-18 DIAGNOSIS — G4730 Sleep apnea, unspecified: Secondary | ICD-10-CM | POA: Insufficient documentation

## 2015-06-18 DIAGNOSIS — N39498 Other specified urinary incontinence: Secondary | ICD-10-CM | POA: Diagnosis not present

## 2015-06-18 DIAGNOSIS — Z7982 Long term (current) use of aspirin: Secondary | ICD-10-CM | POA: Diagnosis not present

## 2015-06-18 DIAGNOSIS — I771 Stricture of artery: Secondary | ICD-10-CM | POA: Insufficient documentation

## 2015-06-18 DIAGNOSIS — E119 Type 2 diabetes mellitus without complications: Secondary | ICD-10-CM | POA: Diagnosis not present

## 2015-06-18 DIAGNOSIS — Z87891 Personal history of nicotine dependence: Secondary | ICD-10-CM | POA: Diagnosis not present

## 2015-06-18 DIAGNOSIS — N529 Male erectile dysfunction, unspecified: Secondary | ICD-10-CM | POA: Insufficient documentation

## 2015-06-18 DIAGNOSIS — N183 Chronic kidney disease, stage 3 (moderate): Secondary | ICD-10-CM | POA: Diagnosis not present

## 2015-06-18 DIAGNOSIS — R351 Nocturia: Secondary | ICD-10-CM | POA: Insufficient documentation

## 2015-06-18 DIAGNOSIS — N401 Enlarged prostate with lower urinary tract symptoms: Secondary | ICD-10-CM | POA: Diagnosis not present

## 2015-06-18 DIAGNOSIS — I10 Essential (primary) hypertension: Secondary | ICD-10-CM | POA: Insufficient documentation

## 2015-06-18 DIAGNOSIS — E785 Hyperlipidemia, unspecified: Secondary | ICD-10-CM | POA: Diagnosis not present

## 2015-06-18 DIAGNOSIS — N508 Other specified disorders of male genital organs: Secondary | ICD-10-CM | POA: Diagnosis not present

## 2015-06-18 HISTORY — DX: Acquired absence of other parts of urinary tract: Z90.6

## 2015-06-18 HISTORY — DX: Dependence on other enabling machines and devices: Z99.89

## 2015-06-18 HISTORY — DX: Chronic kidney disease, stage 3 unspecified: N18.30

## 2015-06-18 HISTORY — DX: Male erectile dysfunction, unspecified: N52.9

## 2015-06-18 HISTORY — DX: Disorder of penis, unspecified: N48.9

## 2015-06-18 HISTORY — PX: PENILE BIOPSY: SHX6013

## 2015-06-18 HISTORY — DX: Personal history of diseases of the skin and subcutaneous tissue: Z87.2

## 2015-06-18 HISTORY — DX: Hyperlipidemia, unspecified: E78.5

## 2015-06-18 HISTORY — DX: Benign prostatic hyperplasia with lower urinary tract symptoms: N40.1

## 2015-06-18 HISTORY — DX: Chronic kidney disease, stage 3 (moderate): N18.3

## 2015-06-18 HISTORY — PX: CYSTOSCOPY: SHX5120

## 2015-06-18 HISTORY — DX: Type 2 diabetes mellitus without complications: E11.9

## 2015-06-18 HISTORY — DX: Obstructive sleep apnea (adult) (pediatric): G47.33

## 2015-06-18 HISTORY — DX: Benign prostatic hyperplasia with lower urinary tract symptoms: N13.8

## 2015-06-18 LAB — POCT I-STAT 4, (NA,K, GLUC, HGB,HCT)
GLUCOSE: 142 mg/dL — AB (ref 65–99)
HCT: 47 % (ref 39.0–52.0)
Hemoglobin: 16 g/dL (ref 13.0–17.0)
POTASSIUM: 4.4 mmol/L (ref 3.5–5.1)
SODIUM: 141 mmol/L (ref 135–145)

## 2015-06-18 SURGERY — BIOPSY, PENIS
Anesthesia: General | Site: Penis

## 2015-06-18 MED ORDER — CEFAZOLIN SODIUM 1-5 GM-% IV SOLN
INTRAVENOUS | Status: AC
  Filled 2015-06-18: qty 50

## 2015-06-18 MED ORDER — BUPIVACAINE HCL (PF) 0.25 % IJ SOLN
INTRAMUSCULAR | Status: DC | PRN
  Administered 2015-06-18: 5 mL

## 2015-06-18 MED ORDER — OXYCODONE HCL 5 MG PO TABS
5.0000 mg | ORAL_TABLET | ORAL | Status: DC | PRN
  Filled 2015-06-18: qty 2

## 2015-06-18 MED ORDER — CEFAZOLIN SODIUM-DEXTROSE 2-3 GM-% IV SOLR
INTRAVENOUS | Status: AC
  Filled 2015-06-18: qty 50

## 2015-06-18 MED ORDER — FENTANYL CITRATE (PF) 100 MCG/2ML IJ SOLN
25.0000 ug | INTRAMUSCULAR | Status: DC | PRN
  Filled 2015-06-18: qty 1

## 2015-06-18 MED ORDER — STERILE WATER FOR IRRIGATION IR SOLN
Status: DC | PRN
Start: 2015-06-18 — End: 2015-06-18
  Administered 2015-06-18: 3000 mL

## 2015-06-18 MED ORDER — EPHEDRINE SULFATE 50 MG/ML IJ SOLN
INTRAMUSCULAR | Status: DC | PRN
  Administered 2015-06-18 (×2): 15 mg via INTRAVENOUS

## 2015-06-18 MED ORDER — CEFAZOLIN SODIUM-DEXTROSE 2-3 GM-% IV SOLR
2.0000 g | INTRAVENOUS | Status: DC
  Filled 2015-06-18: qty 50

## 2015-06-18 MED ORDER — PROPOFOL 10 MG/ML IV BOLUS
INTRAVENOUS | Status: DC | PRN
  Administered 2015-06-18: 200 mg via INTRAVENOUS

## 2015-06-18 MED ORDER — ACETAMINOPHEN 650 MG RE SUPP
650.0000 mg | RECTAL | Status: DC | PRN
  Filled 2015-06-18: qty 1

## 2015-06-18 MED ORDER — FENTANYL CITRATE (PF) 100 MCG/2ML IJ SOLN
INTRAMUSCULAR | Status: DC | PRN
  Administered 2015-06-18 (×3): 50 ug via INTRAVENOUS

## 2015-06-18 MED ORDER — ACETAMINOPHEN 325 MG PO TABS
650.0000 mg | ORAL_TABLET | ORAL | Status: DC | PRN
  Filled 2015-06-18: qty 2

## 2015-06-18 MED ORDER — LIDOCAINE HCL (CARDIAC) 20 MG/ML IV SOLN
INTRAVENOUS | Status: DC | PRN
Start: 2015-06-18 — End: 2015-06-18
  Administered 2015-06-18: 80 mg via INTRAVENOUS

## 2015-06-18 MED ORDER — ONDANSETRON HCL 4 MG/2ML IJ SOLN
4.0000 mg | Freq: Once | INTRAMUSCULAR | Status: DC | PRN
  Filled 2015-06-18: qty 2

## 2015-06-18 MED ORDER — SODIUM CHLORIDE 0.9 % IJ SOLN
3.0000 mL | Freq: Two times a day (BID) | INTRAMUSCULAR | Status: DC
  Filled 2015-06-18: qty 3

## 2015-06-18 MED ORDER — DEXAMETHASONE SODIUM PHOSPHATE 4 MG/ML IJ SOLN
INTRAMUSCULAR | Status: DC | PRN
  Administered 2015-06-18: 10 mg via INTRAVENOUS

## 2015-06-18 MED ORDER — HYDROCODONE-ACETAMINOPHEN 5-325 MG PO TABS: 1.0000 | ORAL_TABLET | Freq: Four times a day (QID) | ORAL | Status: DC | PRN

## 2015-06-18 MED ORDER — SODIUM CHLORIDE 0.9 % IJ SOLN
3.0000 mL | INTRAMUSCULAR | Status: DC | PRN
  Filled 2015-06-18: qty 3

## 2015-06-18 MED ORDER — ONDANSETRON HCL 4 MG/2ML IJ SOLN
INTRAMUSCULAR | Status: DC | PRN
  Administered 2015-06-18: 4 mg via INTRAVENOUS

## 2015-06-18 MED ORDER — FENTANYL CITRATE (PF) 100 MCG/2ML IJ SOLN
INTRAMUSCULAR | Status: AC
  Filled 2015-06-18: qty 4

## 2015-06-18 MED ORDER — SODIUM CHLORIDE 0.9 % IV SOLN
250.0000 mL | INTRAVENOUS | Status: DC | PRN
  Filled 2015-06-18: qty 250

## 2015-06-18 MED ORDER — DEXTROSE 5 % IV SOLN
3.0000 g | INTRAVENOUS | Status: AC
  Administered 2015-06-18: 3 g via INTRAVENOUS
  Filled 2015-06-18: qty 3000

## 2015-06-18 MED ORDER — LACTATED RINGERS IV SOLN
INTRAVENOUS | Status: DC
  Administered 2015-06-18 (×2): via INTRAVENOUS
  Filled 2015-06-18: qty 1000

## 2015-06-18 SURGICAL SUPPLY — 62 items
BAG DRAIN URO-CYSTO SKYTR STRL (DRAIN) ×4 IMPLANT
BAG DRN ANRFLXCHMBR STRAP LEK (BAG) ×3
BAG DRN UROCATH (DRAIN) ×3
BAG URINE LEG 19OZ MD ST LTX (BAG) ×2 IMPLANT
BANDAGE CO FLEX L/F 2IN X 5YD (GAUZE/BANDAGES/DRESSINGS) ×4 IMPLANT
BANDAGE CONFORM 2X5YD N/S (GAUZE/BANDAGES/DRESSINGS) IMPLANT
BLADE SURG 15 STRL LF DISP TIS (BLADE) ×3 IMPLANT
BLADE SURG 15 STRL SS (BLADE) ×4
BNDG COHESIVE 1X5 TAN STRL LF (GAUZE/BANDAGES/DRESSINGS) ×2 IMPLANT
BNDG CONFORM 2 STRL LF (GAUZE/BANDAGES/DRESSINGS) ×2 IMPLANT
CANISTER SUCT LVC 12 LTR MEDI- (MISCELLANEOUS) ×2 IMPLANT
CATH FOLEY 2WAY SLVR  5CC 16FR (CATHETERS) ×1
CATH FOLEY 2WAY SLVR 5CC 16FR (CATHETERS) ×1 IMPLANT
CATH ROBINSON RED A/P 16FR (CATHETERS) ×2 IMPLANT
CATH URET 5FR 28IN OPEN ENDED (CATHETERS) IMPLANT
CLEANER CAUTERY TIP 5X5 PAD (MISCELLANEOUS) ×3 IMPLANT
CLOTH BEACON ORANGE TIMEOUT ST (SAFETY) ×4 IMPLANT
COVER BACK TABLE 60X90IN (DRAPES) ×4 IMPLANT
COVER MAYO STAND STRL (DRAPES) ×4 IMPLANT
DRAPE PED LAPAROTOMY (DRAPES) ×4 IMPLANT
ELECT NDL TIP 2.8 STRL (NEEDLE) IMPLANT
ELECT NEEDLE TIP 2.8 STRL (NEEDLE) IMPLANT
ELECT REM PT RETURN 9FT ADLT (ELECTROSURGICAL) ×4
ELECTRODE REM PT RTRN 9FT ADLT (ELECTROSURGICAL) ×3 IMPLANT
GAUZE SPONGE 4X4 16PLY XRAY LF (GAUZE/BANDAGES/DRESSINGS) ×2 IMPLANT
GAUZE XEROFORM 1X8 LF (GAUZE/BANDAGES/DRESSINGS) ×4 IMPLANT
GLOVE BIO SURGEON STRL SZ 6.5 (GLOVE) ×2 IMPLANT
GLOVE BIOGEL PI IND STRL 6.5 (GLOVE) ×1 IMPLANT
GLOVE BIOGEL PI IND STRL 7.5 (GLOVE) ×1 IMPLANT
GLOVE BIOGEL PI INDICATOR 6.5 (GLOVE) ×1
GLOVE BIOGEL PI INDICATOR 7.5 (GLOVE) ×1
GLOVE SURG SS PI 8.0 STRL IVOR (GLOVE) ×4 IMPLANT
GOWN STRL REUS W/ TWL LRG LVL3 (GOWN DISPOSABLE) ×3 IMPLANT
GOWN STRL REUS W/ TWL XL LVL3 (GOWN DISPOSABLE) ×3 IMPLANT
GOWN STRL REUS W/TWL LRG LVL3 (GOWN DISPOSABLE) ×4
GOWN STRL REUS W/TWL XL LVL3 (GOWN DISPOSABLE) ×4
GUIDEWIRE STR DUAL SENSOR (WIRE) IMPLANT
MANIFOLD NEPTUNE II (INSTRUMENTS) IMPLANT
NDL HYPO 25X1 1.5 SAFETY (NEEDLE) ×2 IMPLANT
NDL SAFETY ECLIPSE 18X1.5 (NEEDLE) IMPLANT
NEEDLE HYPO 18GX1.5 SHARP (NEEDLE)
NEEDLE HYPO 22GX1.5 SAFETY (NEEDLE) IMPLANT
NEEDLE HYPO 25X1 1.5 SAFETY (NEEDLE) ×4 IMPLANT
NS IRRIG 500ML POUR BTL (IV SOLUTION) ×2 IMPLANT
PACK BASIN DAY SURGERY FS (CUSTOM PROCEDURE TRAY) ×4 IMPLANT
PACK CYSTO (CUSTOM PROCEDURE TRAY) ×2 IMPLANT
PAD CLEANER CAUTERY TIP 5X5 (MISCELLANEOUS) ×1
PENCIL BUTTON HOLSTER BLD 10FT (ELECTRODE) ×4 IMPLANT
SPONGE GAUZE 4X4 12PLY STER LF (GAUZE/BANDAGES/DRESSINGS) ×2 IMPLANT
SUT CHROMIC 4 0 PS 2 18 (SUTURE) ×4 IMPLANT
SUT ETHILON 4 0 P 3 18 (SUTURE) ×2 IMPLANT
SUT VIC AB 3-0 FS2 27 (SUTURE) ×8 IMPLANT
SUT VIC AB 4-0 P-3 18XBRD (SUTURE) ×1 IMPLANT
SUT VIC AB 4-0 P3 18 (SUTURE) ×4
SYR 20CC LL (SYRINGE) IMPLANT
SYR BULB IRRIGATION 50ML (SYRINGE) ×2 IMPLANT
SYRINGE CONTROL L 12CC (SYRINGE) ×4 IMPLANT
SYRINGE CONTROL LL 12CC (SYRINGE) ×2 IMPLANT
TOWEL OR 17X24 6PK STRL BLUE (TOWEL DISPOSABLE) ×8 IMPLANT
TRAY DSU PREP LF (CUSTOM PROCEDURE TRAY) ×4 IMPLANT
WATER STERILE IRR 3000ML UROMA (IV SOLUTION) ×4 IMPLANT
WATER STERILE IRR 500ML POUR (IV SOLUTION) IMPLANT

## 2015-06-18 NOTE — Discharge Instructions (Addendum)
CYSTOSCOPY HOME CARE INSTRUCTIONS  Activity: Rest for the remainder of the day.  Do not drive or operate equipment today.  You may resume normal activities in one to two days as instructed by your physician.   Meals: Drink plenty of liquids and eat light foods such as gelatin or soup this evening.  You may return to a normal meal plan tomorrow.  Return to Work: You may return to work in one to two days or as instructed by your physician.  Special Instructions / Symptoms: Call your physician if any of these symptoms occur:   -persistent or heavy bleeding  -bleeding which continues after first few urination  -large blood clots that are difficult to pass  -urine stream diminishes or stops completely  -fever equal to or higher than 101 degrees Farenheit.  -cloudy urine with a strong, foul odor  -severe pain  Females should always wipe from front to back after elimination.  You may feel some burning pain when you urinate.  This should disappear with time.  Applying moist heat to the lower abdomen or a hot tub bath may help relieve the pain.   You may remove the dressing and catheter in the morning.  You can shower in 48 hours but no tub baths for 2 weeks.  Call for bleeding, fever >101 or difficulty voiding.       Post Anesthesia Home Care Instructions  Activity: Get plenty of rest for the remainder of the day. A responsible adult should stay with you for 24 hours following the procedure.  For the next 24 hours, DO NOT: -Drive a car -Paediatric nurse -Drink alcoholic beverages -Take any medication unless instructed by your physician -Make any legal decisions or sign important papers.  Meals: Start with liquid foods such as gelatin or soup. Progress to regular foods as tolerated. Avoid greasy, spicy, heavy foods. If nausea and/or vomiting occur, drink only clear liquids until the nausea and/or vomiting subsides. Call your physician if vomiting continues.  Special  Instructions/Symptoms: Your throat may feel dry or sore from the anesthesia or the breathing tube placed in your throat during surgery. If this causes discomfort, gargle with warm salt water. The discomfort should disappear within 24 hours.  If you had a scopolamine patch placed behind your ear for the management of post- operative nausea and/or vomiting:  1. The medication in the patch is effective for 72 hours, after which it should be removed.  Wrap patch in a tissue and discard in the trash. Wash hands thoroughly with soap and water. 2. You may remove the patch earlier than 72 hours if you experience unpleasant side effects which may include dry mouth, dizziness or visual disturbances. 3. Avoid touching the patch. Wash your hands with soap and water after contact with the patch.

## 2015-06-18 NOTE — Brief Op Note (Signed)
06/18/2015  2:06 PM  PATIENT:  [name].  81 y.o. male  PRE-OPERATIVE DIAGNOSIS:  PENILE LESION   POST-OPERATIVE DIAGNOSIS:  PENILE LESION   PROCEDURE:  Procedure(s): PENILE BIOPSY (N/A) CYSTOSCOPY FLEXIBLE (N/A)  SURGEON:  Surgeon(s) and Role:    * Irine Seal, MD - Primary  PHYSICIAN ASSISTANT:   ASSISTANTS: none   ANESTHESIA:   general  EBL:  Total I/O In: 1360 [P.O.:260; I.V.:1100] Out: 175 [Urine:175]  BLOOD ADMINISTERED:none  DRAINS: Urinary Catheter (Foley)   LOCAL MEDICATIONS USED:  NONE  SPECIMEN:  Source of Specimen:  urethral meatus and distal urethral segment.   DISPOSITION OF SPECIMEN:  PATHOLOGY  COUNTS:  YES  TOURNIQUET:  * No tourniquets in log *  DICTATION: .Other Dictation: Dictation Number [id number]  PLAN OF CARE: Discharge to home after PACU  PATIENT DISPOSITION:  PACU - hemodynamically stable.   Delay start of Pharmacological VTE agent (>24hrs) due to surgical blood loss or risk of bleeding: not applicable

## 2015-06-18 NOTE — Anesthesia Postprocedure Evaluation (Signed)
  Anesthesia Post-op Note  Patient: Charles Castillo.  Procedure(s) Performed: Procedure(s) (LRB): PENILE BIOPSY (N/A) CYSTOSCOPY FLEXIBLE (N/A)  Patient Location: PACU  Anesthesia Type: General  Level of Consciousness: awake and alert   Airway and Oxygen Therapy: Patient Spontanous Breathing  Post-op Pain: mild  Post-op Assessment: Post-op Vital signs reviewed, Patient's Cardiovascular Status Stable, Respiratory Function Stable, Patent Airway and No signs of Nausea or vomiting  Last Vitals:  Filed Vitals:   06/18/15 1400  BP: 138/62  Pulse: 86  Temp:   Resp: 18    Post-op Vital Signs: stable   Complications: No apparent anesthesia complications

## 2015-06-18 NOTE — Anesthesia Preprocedure Evaluation (Addendum)
Anesthesia Evaluation  Patient identified by MRN, date of birth, ID band Patient awake    Reviewed: Allergy & Precautions, NPO status , Patient's Chart, lab work & pertinent test results  History of Anesthesia Complications Negative for: history of anesthetic complications  Airway Mallampati: II  TM Distance: >3 FB Neck ROM: Full    Dental no notable dental hx. (+) Dental Advisory Given   Pulmonary sleep apnea , former smoker,  breath sounds clear to auscultation  Pulmonary exam normal       Cardiovascular hypertension, Pt. on medications Normal cardiovascular examRhythm:Regular Rate:Normal     Neuro/Psych negative neurological ROS  negative psych ROS   GI/Hepatic negative GI ROS, Neg liver ROS,   Endo/Other  diabetes  Renal/GU negative Renal ROS  negative genitourinary   Musculoskeletal  (+) Arthritis -, Osteoarthritis,    Abdominal   Peds negative pediatric ROS (+)  Hematology negative hematology ROS (+)   Anesthesia Other Findings   Reproductive/Obstetrics negative OB ROS                            Anesthesia Physical Anesthesia Plan  ASA: II  Anesthesia Plan: General   Post-op Pain Management:    Induction: Intravenous  Airway Management Planned: LMA  Additional Equipment:   Intra-op Plan:   Post-operative Plan: Extubation in OR  Informed Consent: I have reviewed the patients History and Physical, chart, labs and discussed the procedure including the risks, benefits and alternatives for the proposed anesthesia with the patient or authorized representative who has indicated his/her understanding and acceptance.   Dental advisory given  Plan Discussed with: CRNA  Anesthesia Plan Comments:         Anesthesia Quick Evaluation

## 2015-06-18 NOTE — Transfer of Care (Signed)
Last Vitals:  Filed Vitals:   06/18/15 1101  BP: 118/64  Pulse: 84  Temp: 36.9 C  Resp: 20    Immediate Anesthesia Transfer of Care Note  Patient: Charles Castillo.  Procedure(s) Performed: Procedure(s) (LRB): PENILE BIOPSY (N/A) CYSTOSCOPY FLEXIBLE (N/A)  Patient Location: PACU  Anesthesia Type: General  Level of Consciousness: awake, alert  and oriented  Airway & Oxygen Therapy: Patient Spontanous Breathing and Patient connected to face mask oxygen  Post-op Assessment: Report given to PACU RN and Post -op Vital signs reviewed and stable  Post vital signs: Reviewed and stable  Complications: No apparent anesthesia complications

## 2015-06-18 NOTE — Anesthesia Procedure Notes (Signed)
Procedure Name: LMA Insertion Date/Time: 06/18/2015 12:37 PM Performed by: Mechele Claude Pre-anesthesia Checklist: Patient identified, Emergency Drugs available, Suction available and Patient being monitored Patient Re-evaluated:Patient Re-evaluated prior to inductionOxygen Delivery Method: Circle System Utilized Preoxygenation: Pre-oxygenation with 100% oxygen Intubation Type: IV induction Ventilation: Mask ventilation without difficulty LMA: LMA inserted LMA Size: 5.0 Number of attempts: 1 Airway Equipment and Method: bite block Placement Confirmation: positive ETCO2 Tube secured with: Tape Dental Injury: Teeth and Oropharynx as per pre-operative assessment

## 2015-06-18 NOTE — Interval H&P Note (Signed)
History and Physical Interval Note:  06/18/2015 12:06 PM  Charles Coralyn Helling.  has presented today for surgery, with the diagnosis of PENILE LESION   The various methods of treatment have been discussed with the patient and family. After consideration of risks, benefits and other options for treatment, the patient has consented to  Procedure(s): CYSTOSCOPY (N/A) PENILE BIOPSY (N/A) as a surgical intervention .  The patient's history has been reviewed, patient examined, no change in status, stable for surgery.  I have reviewed the patient's chart and labs.  Questions were answered to the patient's satisfaction.     Charles Castillo

## 2015-06-19 ENCOUNTER — Encounter (HOSPITAL_BASED_OUTPATIENT_CLINIC_OR_DEPARTMENT_OTHER): Payer: Self-pay | Admitting: Urology

## 2015-06-19 NOTE — Op Note (Signed)
NAME:  Charles Castillo, Charles Castillo NO.:  [id number]  MEDICAL RECORD NO.:  [id number]  LOCATION:                                 FACILITY:  PHYSICIAN:  Marshall Cork. Jeffie Pollock, M.D.    DATE OF BIRTH:  01/26/1934  DATE OF PROCEDURE:  06/18/2015 DATE OF DISCHARGE:  06/18/2015                              OPERATIVE REPORT   PROCEDURE: 1. Flexible cystoscopy. 2. Partial urethrectomy with plastic repair of the penis.  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  SURGEON:  Marshall Cork. Jeffie Pollock, M.D.  ANESTHESIA:  General.  SPECIMENS:  Distal urethra and perimeatal tissue.  DRAINS:  Sixteen-French Foley catheter.  BLOOD LOSS:  Minimal.  COMPLICATIONS:  None.  INDICATIONS:  Charles Castillo is a 81 year old white male who has a history of a squamous cell carcinoma, did involve the distal urethra and urethral meatus.  In 2008, he underwent resection and also had urethral Efudex. He did well but was lost to follow up after 5 years and returned recently with a recurrent lesion involving the neomeatus.  It was felt that excisional biopsy and cystoscopy were indicated.  He had a positive cytology as well.  FINDINGS AND PROCEDURE:  Charles Castillo was taken to the operating room where general anesthetic was induced.  He was given 3 g of Ancef.  He was fitted with PAS hose and left in supine position.  His genitalia was prepped with Betadine solution.  He was draped in usual sterile fashion.  Flexible cystoscopy was performed, and there was no apparent urethral mucosal involvement proximal to the meatus.  The external sphincter was intact.  The prostatic urethra had bilobar hyperplasia with some degree of obstruction.  The prostatic urethra was 3-4 cm in length.  There was no significant middle lobe.  Examination of bladder revealed mild-to- moderate trabeculation.  No tumors, stones, or inflammation was  noted. Ureteral orifices were unremarkable.  After cystoscopy, a stay suture was placed in the distal ventral penile skin and secured to the drapes to provide a stable operating field.  The periphery of the lesion was then marked with a marking pen.  It measured approximately 2 x 2.5 cm with some nodular tumor growth close to the meatus and some plaque-like abnormality on the periphery.  The marked line which was drawn to provide a 2-3 mm margin around the obvious abnormality was then incised with a knife.  Tenotomy scissors were used to elevate the mucosa off the underlying Buck's fascia.  There did not appear to be any deep involvement of the tissues.  The urethra was then dissected out for approximately 2 cm and then transected approximately 1.5 cm from the meatus.  The proximal end of the specimen was marked with a Vicryl suture.  A red rubber catheter had been placed to drain the bladder and aid urethral identification during this portion of the procedure.  At this point, a new urethral meatus was created using interrupted 3-0 Vicryl sutures in the proximal extent of the penile incision.  Once the urethral meatus was created circumferentially, approximately 2.5 cm remaining length of the  incision distally was closed in 2 layers with a deep 3-0 Vicryl suture to reapproximate the skin edges followed by interrupted vertical mattress 4-0 Vicryl sutures.  Once the repair was complete and hemostasis was assured, a fresh 16-French Foley catheter was inserted.  The balloon was filled with 10 mL of sterile fluid.  The proximal ventral urethra was infiltrated with 4 mL of 0.25% Marcaine. The wound was cleaned and dressing of Xeroform, Kling, and Coban was then applied.  The catheter was secured to the leg bag drainage.  The patient's anesthetic was reversed.  He was moved to recovery room in stable condition.  There were no complications.     Marshall Cork. Jeffie Pollock, M.D.     JJW/MEDQ  D:   06/18/2015  T:  06/19/2015  Job:  [id number]

## 2015-06-24 ENCOUNTER — Encounter: Payer: Self-pay | Admitting: Family Medicine

## 2015-07-01 DIAGNOSIS — D495 Neoplasm of unspecified behavior of other genitourinary organs: Secondary | ICD-10-CM | POA: Diagnosis not present

## 2015-07-01 DIAGNOSIS — D099 Carcinoma in situ, unspecified: Secondary | ICD-10-CM | POA: Diagnosis not present

## 2015-07-13 DIAGNOSIS — C609 Malignant neoplasm of penis, unspecified: Secondary | ICD-10-CM | POA: Diagnosis not present

## 2015-07-13 DIAGNOSIS — C679 Malignant neoplasm of bladder, unspecified: Secondary | ICD-10-CM | POA: Diagnosis not present

## 2015-07-15 DIAGNOSIS — C680 Malignant neoplasm of urethra: Secondary | ICD-10-CM | POA: Diagnosis not present

## 2015-07-24 DIAGNOSIS — Z8639 Personal history of other endocrine, nutritional and metabolic disease: Secondary | ICD-10-CM | POA: Diagnosis not present

## 2015-07-24 DIAGNOSIS — C680 Malignant neoplasm of urethra: Secondary | ICD-10-CM | POA: Diagnosis not present

## 2015-07-24 DIAGNOSIS — Z8679 Personal history of other diseases of the circulatory system: Secondary | ICD-10-CM | POA: Diagnosis not present

## 2015-07-27 DIAGNOSIS — E669 Obesity, unspecified: Secondary | ICD-10-CM | POA: Diagnosis not present

## 2015-07-27 DIAGNOSIS — Z87891 Personal history of nicotine dependence: Secondary | ICD-10-CM | POA: Diagnosis not present

## 2015-07-27 DIAGNOSIS — C680 Malignant neoplasm of urethra: Secondary | ICD-10-CM | POA: Diagnosis not present

## 2015-07-27 DIAGNOSIS — N529 Male erectile dysfunction, unspecified: Secondary | ICD-10-CM | POA: Diagnosis not present

## 2015-07-27 DIAGNOSIS — C609 Malignant neoplasm of penis, unspecified: Secondary | ICD-10-CM | POA: Diagnosis not present

## 2015-07-27 DIAGNOSIS — E780 Pure hypercholesterolemia: Secondary | ICD-10-CM | POA: Diagnosis not present

## 2015-07-27 DIAGNOSIS — Z8549 Personal history of malignant neoplasm of other male genital organs: Secondary | ICD-10-CM | POA: Diagnosis not present

## 2015-07-27 HISTORY — PX: TOTAL URETHRECTOMY,RADICAL: SHX6666

## 2015-07-31 DIAGNOSIS — Z466 Encounter for fitting and adjustment of urinary device: Secondary | ICD-10-CM | POA: Diagnosis not present

## 2015-08-01 DIAGNOSIS — Z4803 Encounter for change or removal of drains: Secondary | ICD-10-CM | POA: Diagnosis not present

## 2015-08-11 DIAGNOSIS — C680 Malignant neoplasm of urethra: Secondary | ICD-10-CM | POA: Diagnosis not present

## 2015-08-18 ENCOUNTER — Other Ambulatory Visit: Payer: Self-pay | Admitting: Family Medicine

## 2015-08-31 ENCOUNTER — Ambulatory Visit: Payer: Medicare Other | Admitting: Family Medicine

## 2015-09-08 ENCOUNTER — Ambulatory Visit: Payer: Medicare Other | Admitting: Family Medicine

## 2015-09-14 ENCOUNTER — Encounter: Payer: Self-pay | Admitting: Family Medicine

## 2015-09-14 ENCOUNTER — Ambulatory Visit (INDEPENDENT_AMBULATORY_CARE_PROVIDER_SITE_OTHER): Payer: PRIVATE HEALTH INSURANCE | Admitting: Family Medicine

## 2015-09-14 VITALS — BP 139/85 | HR 88 | Temp 98.6°F | Ht 74.0 in | Wt 271.0 lb

## 2015-09-14 DIAGNOSIS — R350 Frequency of micturition: Secondary | ICD-10-CM

## 2015-09-14 DIAGNOSIS — N390 Urinary tract infection, site not specified: Secondary | ICD-10-CM | POA: Diagnosis not present

## 2015-09-14 LAB — POCT URINALYSIS DIPSTICK
BILIRUBIN UA: NEGATIVE
GLUCOSE UA: NEGATIVE
Ketones, UA: NEGATIVE
Leukocytes, UA: NEGATIVE
Nitrite, UA: NEGATIVE
Protein, UA: NEGATIVE
RBC UA: NEGATIVE
Spec Grav, UA: 1.025
Urobilinogen, UA: 0.2
pH, UA: 6

## 2015-09-14 MED ORDER — CIPROFLOXACIN HCL 500 MG PO TABS: 500.0000 mg | ORAL_TABLET | Freq: Two times a day (BID) | ORAL | Status: DC

## 2015-09-14 NOTE — Progress Notes (Signed)
   Subjective:    Patient ID: Charles Lasso., male    DOB: 04-Apr-1934, 81 y.o.   MRN: [id number]  HPI Here for one week of urinary urgency and frequency, as well as trouble urinating at times. No burning, no back pain or fever. He drinks plenty of water.    Review of Systems  Constitutional: Negative.   Respiratory: Negative.   Cardiovascular: Negative.   Gastrointestinal: Negative.   Genitourinary: Positive for urgency, frequency and difficulty urinating. Negative for hematuria and flank pain.       Objective:   Physical Exam  Constitutional: He appears well-developed and well-nourished.  Cardiovascular: Normal rate, regular rhythm, normal heart sounds and intact distal pulses.   Pulmonary/Chest: Effort normal and breath sounds normal.  Abdominal: Soft. Bowel sounds are normal. He exhibits no distension and no mass. There is no tenderness. There is no rebound and no guarding.          Assessment & Plan:  UTI, probably a prostatitis. Treat with 14 days of Cipro. Recheck prn

## 2015-09-14 NOTE — Progress Notes (Signed)
Pre visit review using our clinic review tool, if applicable. No additional management support is needed unless otherwise documented below in the visit note. 

## 2015-09-18 ENCOUNTER — Encounter: Payer: Self-pay | Admitting: Family Medicine

## 2015-09-18 NOTE — Telephone Encounter (Signed)
Tell him we will call in a rx for Flomax, which reduced the swelling of the prostate and should help him empty his bladder more easily. Take this along with the Cipro. Call in Flomax 0.4 mg to take daily, #30 with no rf. Come in next week if not better by Monday

## 2015-09-22 NOTE — Telephone Encounter (Signed)
I spoke with pt, he is out of town right now. He will see Dr. Yong Channel on Friday 09/25/2015, no need to send in script now since pt is out to town.

## 2015-09-25 ENCOUNTER — Encounter: Payer: Self-pay | Admitting: Family Medicine

## 2015-09-25 ENCOUNTER — Ambulatory Visit (INDEPENDENT_AMBULATORY_CARE_PROVIDER_SITE_OTHER): Payer: Medicare Other | Admitting: Family Medicine

## 2015-09-25 VITALS — BP 122/60 | HR 87 | Temp 98.3°F | Wt 272.0 lb

## 2015-09-25 DIAGNOSIS — R351 Nocturia: Secondary | ICD-10-CM

## 2015-09-25 DIAGNOSIS — R358 Other polyuria: Secondary | ICD-10-CM

## 2015-09-25 DIAGNOSIS — R3589 Other polyuria: Secondary | ICD-10-CM

## 2015-09-25 DIAGNOSIS — N3943 Post-void dribbling: Secondary | ICD-10-CM | POA: Diagnosis not present

## 2015-09-25 MED ORDER — TAMSULOSIN HCL 0.4 MG PO CAPS: 0.4000 mg | ORAL_CAPSULE | Freq: Every day | ORAL | Status: DC

## 2015-09-25 NOTE — Progress Notes (Signed)
Garret Reddish, MD  Subjective:  Charles Castillo. is a 81 y.o. year old very pleasant male patient who presents with:  Polyuria, nocturia, dribbling -Patient saw Dr. Sarajane Jews on 09/14/15 for possible UTI given above symptoms. UA was negative. No culture sent. Thought possible prostatitis so placed on 14 days of ciprofloxacin. Patient has had no improvement in symptoms on this. He pees frequently though day, sits on toilet for 5-6 minutes with just light amounts of dribbling and gets up 4x a night when used to get up about once a day.  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 He urinates from behind his scrotum after reconstruction. Still has prostate and no history prostate cancer though has been told has BPH in past.   ROS- no discharge from urinary tract, no polydipsia. Known diabetes but no polydipsia.   Past Medical History-  Patient Active Problem List   Diagnosis Date Noted  . Diabetes mellitus ([place]) 09/15/2014    Priority: High  . Erectile dysfunction 09/15/2014    Priority: Medium  . Urethral Cancer s/p excision.  11/04/2009    Priority: Medium  . CKD (chronic kidney disease), stage III 03/24/2008    Priority: Medium  . HYPERLIPIDEMIA 08/27/2007    Priority: Medium  . Essential hypertension 07/03/2007    Priority: Medium  . SLEEP APNEA 07/03/2007    Priority: Medium  . Former smoker 09/15/2014    Priority: Low  . Obesity 10/10/2012    Priority: Low  . Osteoarthritis 07/03/2007    Priority: Low   Medications- reviewed and updated Current Outpatient Prescriptions  Medication Sig Dispense Refill  . amLODipine (NORVASC) 5 MG tablet TAKE ONE TABLET BY MOUTH ONCE DAILY 90 tablet 2  . aspirin 81 MG tablet Take 81 mg by mouth daily.      Marland Kitchen atorvastatin (LIPITOR) 40 MG tablet Take 1 tablet (40 mg total) by mouth daily. (Patient taking differently: Take 40 mg by mouth  every evening. ) 90 tablet 3  . ciprofloxacin (CIPRO) 500 MG tablet Take 1 tablet (500 mg total) by mouth 2 (two) times daily. 28 tablet 0  . DiphenhydrAMINE HCl, Sleep, (SOMINEX PO) Take by mouth Nightly.    Marland Kitchen HYDROcodone-acetaminophen (NORCO) 5-325 MG per tablet Take 1 tablet by mouth every 6 (six) hours as needed for moderate pain. (Patient not taking: Reported on 09/14/2015) 30 tablet 0  . tadalafil (CIALIS) 20 MG tablet Take 1 tablet (20 mg total) by mouth daily as needed. (Patient not taking: Reported on 09/14/2015) 30 tablet 3   No current facility-administered medications for this visit.    Objective: BP 122/60 mmHg  Pulse 87  Temp(Src) 98.3 F (36.8 C)  Wt 272 lb (123.378 kg) Gen: NAD, resting comfortably CV: RRR no murmurs rubs or gallops Lungs: CTAB no crackles, wheeze, rhonchi Abdomen: soft/nontender/nondistended/normal bowel sounds. No rebound or guarding.  Rectal: normal tone (not boggy and not painful to palpation), diffusely enlarged prostate, no masses or tenderness Ext: no edema Skin: warm, dry, no rash Neuro: grossly normal, moves all extremities  Lab Results  Component Value Date   HGBA1C 6.4 03/19/2015   Assessment/Plan:  Polyuria, nocturia, dribbling Patient had called his urologist Dr. Dimas Millin at [facility] and he told patient to get a urine culture. This has not yet been obtained so we will obtain today. Patient does have some BPH on exam so we will trial flomax as well. If  culture negative, patient is to follow up with urology- I suspect it will be negative. He is to finish ciprofloxacin though. Concern prostatitis per Dr. Sarajane Jews but no improvement on cipro and exam not consistent with typical prostatitis.   Return precautions advised.

## 2015-09-25 NOTE — Patient Instructions (Addendum)
Flu shot received today.  Get urine culture to make sure no infection  I doubt infection in urine or prostate given no response to cipro  Take flomax to see if will help you with symptoms (be careful- can make you lightheaded with standing)  If culture is negative, we will have you reach back out to Dr. Dimas Millin first but may also use Dr. Roni Bread locally

## 2015-09-27 LAB — URINE CULTURE
COLONY COUNT: NO GROWTH
ORGANISM ID, BACTERIA: NO GROWTH

## 2015-09-29 DIAGNOSIS — N35010 Post-traumatic urethral stricture, male, meatal: Secondary | ICD-10-CM | POA: Diagnosis not present

## 2015-09-29 DIAGNOSIS — R350 Frequency of micturition: Secondary | ICD-10-CM | POA: Diagnosis not present

## 2015-09-29 DIAGNOSIS — N99110 Postprocedural urethral stricture, male, meatal: Secondary | ICD-10-CM | POA: Diagnosis not present

## 2015-10-13 DIAGNOSIS — Z466 Encounter for fitting and adjustment of urinary device: Secondary | ICD-10-CM | POA: Diagnosis not present

## 2015-10-15 ENCOUNTER — Emergency Department (HOSPITAL_COMMUNITY)
Admission: EM | Admit: 2015-10-15 | Discharge: 2015-10-15 | Disposition: A | Discharge status: Home / Self Care | Payer: Medicare Other | Attending: Emergency Medicine | Admitting: Emergency Medicine

## 2015-10-15 ENCOUNTER — Encounter (HOSPITAL_COMMUNITY): Payer: Self-pay | Admitting: Emergency Medicine

## 2015-10-15 DIAGNOSIS — Z79899 Other long term (current) drug therapy: Secondary | ICD-10-CM | POA: Insufficient documentation

## 2015-10-15 DIAGNOSIS — I129 Hypertensive chronic kidney disease with stage 1 through stage 4 chronic kidney disease, or unspecified chronic kidney disease: Secondary | ICD-10-CM | POA: Insufficient documentation

## 2015-10-15 DIAGNOSIS — M1990 Unspecified osteoarthritis, unspecified site: Secondary | ICD-10-CM | POA: Insufficient documentation

## 2015-10-15 DIAGNOSIS — Z87891 Personal history of nicotine dependence: Secondary | ICD-10-CM | POA: Insufficient documentation

## 2015-10-15 DIAGNOSIS — R339 Retention of urine, unspecified: Secondary | ICD-10-CM | POA: Insufficient documentation

## 2015-10-15 DIAGNOSIS — Z960 Presence of urogenital implants: Secondary | ICD-10-CM | POA: Diagnosis not present

## 2015-10-15 DIAGNOSIS — Z872 Personal history of diseases of the skin and subcutaneous tissue: Secondary | ICD-10-CM | POA: Insufficient documentation

## 2015-10-15 DIAGNOSIS — N183 Chronic kidney disease, stage 3 (moderate): Secondary | ICD-10-CM | POA: Insufficient documentation

## 2015-10-15 DIAGNOSIS — E119 Type 2 diabetes mellitus without complications: Secondary | ICD-10-CM | POA: Insufficient documentation

## 2015-10-15 DIAGNOSIS — Z9981 Dependence on supplemental oxygen: Secondary | ICD-10-CM | POA: Insufficient documentation

## 2015-10-15 DIAGNOSIS — Z87438 Personal history of other diseases of male genital organs: Secondary | ICD-10-CM | POA: Insufficient documentation

## 2015-10-15 DIAGNOSIS — Z7982 Long term (current) use of aspirin: Secondary | ICD-10-CM | POA: Diagnosis not present

## 2015-10-15 DIAGNOSIS — G4733 Obstructive sleep apnea (adult) (pediatric): Secondary | ICD-10-CM | POA: Diagnosis not present

## 2015-10-15 LAB — URINALYSIS, ROUTINE W REFLEX MICROSCOPIC
BILIRUBIN URINE: NEGATIVE
GLUCOSE, UA: NEGATIVE mg/dL
HGB URINE DIPSTICK: NEGATIVE
Ketones, ur: NEGATIVE mg/dL
Nitrite: NEGATIVE
PH: 5 (ref 5.0–8.0)
Protein, ur: NEGATIVE mg/dL
SPECIFIC GRAVITY, URINE: 1.025 (ref 1.005–1.030)
UROBILINOGEN UA: 0.2 mg/dL (ref 0.0–1.0)

## 2015-10-15 LAB — URINE MICROSCOPIC-ADD ON

## 2015-10-15 NOTE — ED Provider Notes (Signed)
CSN: [id number]     Arrival date & time 10/15/15  1101 History   First MD Initiated Contact with Patient 10/15/15 1119     Chief Complaint  Patient presents with  . Urinary Retention     (Consider location/radiation/quality/duration/timing/severity/associated sxs/prior Treatment) HPI Comments: The patient is 81 years old, he has a history of urethral cancer recently treated at [facility] in the last 2 months with urethral surgery and rerouting of his urinary stream through the base of the scrotum. He has had to have dilation and a Foley catheter placed in that location in the past but has been able to urinate spontaneously recently. Last night at approximately 4:00 AM he had onset of urinary retention and has not been able to urinate since that time. He denies any other symptoms except for abdominal discomfort. No fevers chills nausea or vomiting. Symptoms are persistent, worsening, severe  The history is provided by the patient.    Past Medical History  Diagnosis Date  . Hypertension   . OSA on CPAP     mild to moderate per study 04-19-2011  . Hyperlipidemia   . BPH (benign prostatic hypertrophy) with urinary obstruction   . History of urinary tract part removal     2008--  partial urethrectomy for SCC  . Organic impotence   . CKD (chronic kidney disease), stage III   . Penile lesion   . History of cellulitis     2012-- left lower extremitiy  . Diet-controlled type 2 diabetes mellitus ([place])   . Arthritis    Past Surgical History  Procedure Laterality Date  . Urethrectomy  05-01-2007    Partial  (squamous cell carcinoma )  . Cysto/  bilateral retrograde pyelogram/  resection prostatic urethral tumor/  excision biopsy urethral meatus and meatotomy/  transrectal ultrasound prostate biopsy's  12-05-2006  . Penile biopsy N/A 06/18/2015    Procedure: PENILE BIOPSY;  Surgeon: Irine Seal, MD;  Location: [facility];  Service: Urology;  Laterality: N/A;  . Cystoscopy  N/A 06/18/2015    Procedure: CYSTOSCOPY FLEXIBLE;  Surgeon: Irine Seal, MD;  Location: [facility];  Service: Urology;  Laterality: N/A;   Family History  Problem Relation Age of Onset  . Heart failure Father     CHF, no history MI   Social History  Substance Use Topics  . Smoking status: Former Smoker -- 1.00 packs/day for 25 years    Types: Cigarettes    Quit date: 12/19/1968  . Smokeless tobacco: Never Used  . Alcohol Use: No    Review of Systems  All other systems reviewed and are negative.     Allergies  Review of patient's allergies indicates no known allergies.  Home Medications   Prior to Admission medications   Medication Sig Start Date End Date Taking? Authorizing Provider  amLODipine (NORVASC) 5 MG tablet TAKE ONE TABLET BY MOUTH ONCE DAILY 08/18/15  Yes Marin Olp, MD  aspirin EC 81 MG tablet Take 81 mg by mouth daily.   Yes Historical Provider, MD  atorvastatin (LIPITOR) 40 MG tablet Take 1 tablet (40 mg total) by mouth daily. Patient taking differently: Take 40 mg by mouth every evening.  03/19/15  Yes Marin Olp, MD  DiphenhydrAMINE HCl, Sleep, (SOMINEX PO) Take 1 tablet by mouth at bedtime.    Yes Historical Provider, MD  Multiple Vitamin (MULTI-VITAMINS) TABS Take 1 tablet by mouth daily.   Yes Historical Provider, MD  naproxen sodium (ANAPROX) 220 MG  tablet Take 220 mg by mouth daily as needed (pain).   Yes Historical Provider, MD  tadalafil (CIALIS) 20 MG tablet Take 1 tablet (20 mg total) by mouth daily as needed. 11/17/14  Yes Marin Olp, MD  tamsulosin (FLOMAX) 0.4 MG CAPS capsule Take 1 capsule (0.4 mg total) by mouth daily. 09/25/15  Yes Marin Olp, MD  ciprofloxacin (CIPRO) 500 MG tablet Take 1 tablet (500 mg total) by mouth 2 (two) times daily. Patient not taking: Reported on 10/15/2015 09/14/15   Laurey Morale, MD   BP 135/86 mmHg  Pulse 100  Temp(Src) 97.5 F (36.4 C) (Oral)  SpO2 98% Physical Exam   Constitutional: He appears well-developed and well-nourished. No distress.  HENT:  Head: Normocephalic and atraumatic.  Mouth/Throat: Oropharynx is clear and moist. No oropharyngeal exudate.  Eyes: Conjunctivae and EOM are normal. Pupils are equal, round, and reactive to light. Right eye exhibits no discharge. Left eye exhibits no discharge. No scleral icterus.  Neck: Normal range of motion. Neck supple. No JVD present. No thyromegaly present.  Cardiovascular: Normal rate, regular rhythm, normal heart sounds and intact distal pulses.  Exam reveals no gallop and no friction rub.   No murmur heard. Pulmonary/Chest: Effort normal and breath sounds normal. No respiratory distress. He has no wheezes. He has no rales.  Abdominal: Soft. Bowel sounds are normal. He exhibits no distension and no mass. There is tenderness ( Tender to palpation in the lower abdomen below the umbilicus, full, tense, no upper abdominal tenderness).  Genitourinary:  Normal appearing penis scrotum and testicles except for the absence of the penile urethra. There is a orifice in the inferior posterior scrotum near the perineum which serves as a Administrator, [employer], this appears patent but there is no urine  Musculoskeletal: Normal range of motion. He exhibits no edema or tenderness.  Lymphadenopathy:    He has no cervical adenopathy.  Neurological: He is alert. Coordination normal.  Skin: Skin is warm and dry. No rash noted. No erythema.  Psychiatric: He has a normal mood and affect. His behavior is normal.  Nursing note and vitals reviewed.   ED Course  Procedures (including critical care time) Labs Review Labs Reviewed  URINALYSIS, ROUTINE W REFLEX MICROSCOPIC (NOT AT [facility]) - Abnormal; Notable for the following:    APPearance HAZY (*)    Leukocytes, UA MODERATE (*)    All other components within normal limits  URINE MICROSCOPIC-ADD ON - Abnormal; Notable for the following:    Bacteria, UA FEW (*)    All other  components within normal limits  URINE CULTURE    Imaging Review No results found. I have personally reviewed and evaluated these images and lab results as part of my medical decision-making.    MDM   Final diagnoses:  Urinary retention    The patient appears very uncomfortable, he is borderline tachycardic, this is all likely secondary to urinary retention. Will attempt placement of a Foley catheter. He states that he has follow-up at [facility] tomorrow for evaluation and repeat exam of his urethra, further manipulation as needed at that time. Urinalysis sent with culture  UA overall unremarkable - culture pending - pain relieved with catheter placement.  Stable for d/c.   Noemi Chapel, MD 10/15/15 [phone]

## 2015-10-15 NOTE — ED Notes (Signed)
MD at bedside. 

## 2015-10-15 NOTE — ED Notes (Signed)
AVS explained in detail. Has appointment with MD at [facility] tomorrow (follows patient closely). Leg bag applied. Re-iterated teaching regarding clean technique switching from leg back to standard drainage bag. No other c/c.

## 2015-10-15 NOTE — ED Notes (Signed)
Pt states that he had cancer in his penis and had a urethrectomy.  Urethra is now at the base of his penis.  Had trouble urinating, so they dilated it and put a catheter in, left the catheter for 2 wks.  Taken out on Tuesday and since then has had trouble urinating.  Has not been able to urinate since 0400 this morning.

## 2015-10-16 DIAGNOSIS — R339 Retention of urine, unspecified: Secondary | ICD-10-CM | POA: Diagnosis not present

## 2015-10-18 ENCOUNTER — Emergency Department (HOSPITAL_COMMUNITY)
Admission: EM | Admit: 2015-10-18 | Discharge: 2015-10-18 | Disposition: A | Discharge status: Home / Self Care | Payer: Medicare Other | Attending: Emergency Medicine | Admitting: Emergency Medicine

## 2015-10-18 ENCOUNTER — Encounter (HOSPITAL_COMMUNITY): Payer: Self-pay | Admitting: *Deleted

## 2015-10-18 DIAGNOSIS — Z9889 Other specified postprocedural states: Secondary | ICD-10-CM | POA: Diagnosis not present

## 2015-10-18 DIAGNOSIS — G4733 Obstructive sleep apnea (adult) (pediatric): Secondary | ICD-10-CM | POA: Diagnosis not present

## 2015-10-18 DIAGNOSIS — E119 Type 2 diabetes mellitus without complications: Secondary | ICD-10-CM | POA: Insufficient documentation

## 2015-10-18 DIAGNOSIS — N183 Chronic kidney disease, stage 3 (moderate): Secondary | ICD-10-CM | POA: Diagnosis not present

## 2015-10-18 DIAGNOSIS — Y846 Urinary catheterization as the cause of abnormal reaction of the patient, or of later complication, without mention of misadventure at the time of the procedure: Secondary | ICD-10-CM | POA: Diagnosis not present

## 2015-10-18 DIAGNOSIS — Y828 Other medical devices associated with adverse incidents: Secondary | ICD-10-CM | POA: Insufficient documentation

## 2015-10-18 DIAGNOSIS — Z7982 Long term (current) use of aspirin: Secondary | ICD-10-CM | POA: Diagnosis not present

## 2015-10-18 DIAGNOSIS — R339 Retention of urine, unspecified: Secondary | ICD-10-CM | POA: Diagnosis present

## 2015-10-18 DIAGNOSIS — Z87891 Personal history of nicotine dependence: Secondary | ICD-10-CM | POA: Diagnosis not present

## 2015-10-18 DIAGNOSIS — N401 Enlarged prostate with lower urinary tract symptoms: Secondary | ICD-10-CM | POA: Diagnosis not present

## 2015-10-18 DIAGNOSIS — Z9981 Dependence on supplemental oxygen: Secondary | ICD-10-CM | POA: Diagnosis not present

## 2015-10-18 DIAGNOSIS — T83098A Other mechanical complication of other indwelling urethral catheter, initial encounter: Secondary | ICD-10-CM | POA: Diagnosis not present

## 2015-10-18 DIAGNOSIS — M1990 Unspecified osteoarthritis, unspecified site: Secondary | ICD-10-CM | POA: Diagnosis not present

## 2015-10-18 DIAGNOSIS — Z906 Acquired absence of other parts of urinary tract: Secondary | ICD-10-CM | POA: Diagnosis not present

## 2015-10-18 DIAGNOSIS — T83038A Leakage of other indwelling urethral catheter, initial encounter: Secondary | ICD-10-CM | POA: Diagnosis not present

## 2015-10-18 DIAGNOSIS — T839XXA Unspecified complication of genitourinary prosthetic device, implant and graft, initial encounter: Secondary | ICD-10-CM

## 2015-10-18 DIAGNOSIS — Z79899 Other long term (current) drug therapy: Secondary | ICD-10-CM | POA: Insufficient documentation

## 2015-10-18 DIAGNOSIS — E785 Hyperlipidemia, unspecified: Secondary | ICD-10-CM | POA: Insufficient documentation

## 2015-10-18 DIAGNOSIS — Z87438 Personal history of other diseases of male genital organs: Secondary | ICD-10-CM | POA: Insufficient documentation

## 2015-10-18 DIAGNOSIS — Z872 Personal history of diseases of the skin and subcutaneous tissue: Secondary | ICD-10-CM | POA: Diagnosis not present

## 2015-10-18 DIAGNOSIS — I129 Hypertensive chronic kidney disease with stage 1 through stage 4 chronic kidney disease, or unspecified chronic kidney disease: Secondary | ICD-10-CM | POA: Insufficient documentation

## 2015-10-18 LAB — URINE CULTURE

## 2015-10-18 NOTE — ED Notes (Signed)
Patient is has history of ureteral cancer, which is being managed at [facility].  Patient underwent ureterectomy in August 2016.  Foley cath was place on Thursday 10/27 due to urinary retention. Patient started foley started leaking on  Friday 10/28.

## 2015-10-18 NOTE — ED Provider Notes (Signed)
CSN: [id number]     Arrival date & time 10/18/15  1035 History   First MD Initiated Contact with Patient 10/18/15 1058     Chief Complaint  Patient presents with  . Urinary Retention    Foley catheter leaking      (Consider location/radiation/quality/duration/timing/severity/associated sxs/prior Treatment) Patient is a 81 y.o. male presenting with general illness.  Illness Location:  Perineum Quality:  Leaking from around foley catheter Severity:  Severe Onset quality:  Gradual Duration:  2 days Timing:  Constant Progression:  Unchanged Chronicity:  New Context:  Recent urinary retention, catheter placed on thurs, leaking started friday, catheter draining normally Relieved by:  Nothing Associated symptoms: no abdominal pain, no chest pain, no cough, no diarrhea, no fever, no headaches, no rash, no shortness of breath and no sore throat   Risk factors:  None   Past Medical History  Diagnosis Date  . Hypertension   . OSA on CPAP     mild to moderate per study 04-19-2011  . Hyperlipidemia   . BPH (benign prostatic hypertrophy) with urinary obstruction   . History of urinary tract part removal     2008--  partial urethrectomy for SCC  . Organic impotence   . CKD (chronic kidney disease), stage III   . Penile lesion   . History of cellulitis     2012-- left lower extremitiy  . Diet-controlled type 2 diabetes mellitus ([place])   . Arthritis    Past Surgical History  Procedure Laterality Date  . Urethrectomy  05-01-2007    Partial  (squamous cell carcinoma )  . Cysto/  bilateral retrograde pyelogram/  resection prostatic urethral tumor/  excision biopsy urethral meatus and meatotomy/  transrectal ultrasound prostate biopsy's  12-05-2006  . Penile biopsy N/A 06/18/2015    Procedure: PENILE BIOPSY;  Surgeon: Irine Seal, MD;  Location: [facility];  Service: Urology;  Laterality: N/A;  . Cystoscopy N/A 06/18/2015    Procedure: CYSTOSCOPY FLEXIBLE;  Surgeon: Irine Seal, MD;  Location: [facility];  Service: Urology;  Laterality: N/A;   Family History  Problem Relation Age of Onset  . Heart failure Father     CHF, no history MI   Social History  Substance Use Topics  . Smoking status: Former Smoker -- 1.00 packs/day for 25 years    Types: Cigarettes    Quit date: 12/19/1968  . Smokeless tobacco: Never Used  . Alcohol Use: No    Review of Systems  Constitutional: Negative for fever.  HENT: Negative for sore throat.   Eyes: Negative for visual disturbance.  Respiratory: Negative for cough and shortness of breath.   Cardiovascular: Negative for chest pain.  Gastrointestinal: Negative for abdominal pain and diarrhea.  Genitourinary: Positive for difficulty urinating (recent urinary retention, foley catheter placed, now with leaking around cathetr).  Musculoskeletal: Negative for back pain and neck stiffness.  Skin: Negative for rash.  Neurological: Negative for syncope and headaches.      Allergies  Review of patient's allergies indicates no known allergies.  Home Medications   Prior to Admission medications   Medication Sig Start Date End Date Taking? Authorizing Provider  amLODipine (NORVASC) 5 MG tablet TAKE ONE TABLET BY MOUTH ONCE DAILY 08/18/15  Yes Marin Olp, MD  aspirin EC 81 MG tablet Take 81 mg by mouth daily.   Yes Historical Provider, MD  atorvastatin (LIPITOR) 40 MG tablet Take 1 tablet (40 mg total) by mouth daily. Patient taking differently: Take  40 mg by mouth every evening.  03/19/15  Yes Marin Olp, MD  DiphenhydrAMINE HCl, Sleep, (SOMINEX PO) Take 1 tablet by mouth at bedtime.    Yes Historical Provider, MD  Multiple Vitamin (MULTI-VITAMINS) TABS Take 1 tablet by mouth daily.   Yes Historical Provider, MD  naproxen sodium (ANAPROX) 220 MG tablet Take 220 mg by mouth daily as needed (pain).   Yes Historical Provider, MD  tadalafil (CIALIS) 20 MG tablet Take 1 tablet (20 mg total) by mouth  daily as needed. 11/17/14  Yes Marin Olp, MD  tamsulosin (FLOMAX) 0.4 MG CAPS capsule Take 1 capsule (0.4 mg total) by mouth daily. 09/25/15  Yes Marin Olp, MD  ciprofloxacin (CIPRO) 500 MG tablet Take 1 tablet (500 mg total) by mouth 2 (two) times daily. Patient not taking: Reported on 10/15/2015 09/14/15   Laurey Morale, MD   BP 142/72 mmHg  Pulse 88  Temp(Src) 98 F (36.7 C) (Oral)  Resp 20  SpO2 97% Physical Exam  Constitutional: He is oriented to person, place, and time. He appears well-developed and well-nourished. No distress.  HENT:  Head: Normocephalic and atraumatic.  Eyes: Conjunctivae and EOM are normal.  Neck: Normal range of motion.  Cardiovascular: Normal rate, regular rhythm, normal heart sounds and intact distal pulses.  Exam reveals no gallop and no friction rub.   No murmur heard. Pulmonary/Chest: Effort normal and breath sounds normal. No respiratory distress. He has no wheezes. He has no rales.  Abdominal: Soft. He exhibits no distension. There is no tenderness. There is no guarding.  Genitourinary:  Urethra diverted to perineum, no erythema, no swelling  Musculoskeletal: He exhibits no edema.  Neurological: He is alert and oriented to person, place, and time.  Skin: Skin is warm and dry. He is not diaphoretic.  Nursing note and vitals reviewed.   ED Course  BLADDER CATHETERIZATION Date/Time: 10/18/2015 10:26 PM Performed by: Gareth Morgan Authorized by: Gareth Morgan Consent: Verbal consent obtained. Risks and benefits: risks, benefits and alternatives were discussed Time out: Immediately prior to procedure a "time out" was called to verify the correct patient, procedure, equipment, support staff and site/side marked as required. Indications: catheter change Local anesthesia used: no Patient sedated: no Preparation: Patient was prepped and draped in the usual sterile fashion. Catheter insertion: indwelling Catheter type:  Foley Catheter size: 20 Fr Complicated insertion: no Altered anatomy: yes Altered anatomy details: URETERECTOMY WITH DIVERSION TO PERINEUMS. Bladder irrigation: no Number of attempts: 1 Urine characteristics: yellow Patient tolerance: Patient tolerated the procedure well with no immediate complications   (including critical care time) Labs Review Labs Reviewed - No data to display  Imaging Review No results found. I have personally reviewed and evaluated these images and lab results as part of my medical decision-making.   EKG Interpretation None      MDM   Final diagnoses:  Foley catheter problem, initial encounter [facility])   81yo male with history of urethral cancer s/p urethrectomy, htn, hlpd, CKD presents with concern of leaking around the catheter that was placed on Thursday.  Pt with 73F cathter which was increased to 36F.  Catheter placed by me at bedside without complications. No other symptoms to suggest UTI or obstruction. Patient discharged in stable condition with understanding of reasons to return.     Gareth Morgan, MD 10/18/15 2241

## 2015-10-19 DIAGNOSIS — Z466 Encounter for fitting and adjustment of urinary device: Secondary | ICD-10-CM | POA: Diagnosis not present

## 2015-10-19 NOTE — Progress Notes (Signed)
ED Antimicrobial Stewardship Positive Culture Follow Up   Charles Castillo. is an 81 y.o. male who presented to [facility] on 10/15/2015 with a chief complaint of  Chief Complaint  Patient presents with  . Urinary Retention    Recent Results (from the past 720 hour(s))  Urine culture     Status: None   Collection Time: 09/25/15  2:05 PM  Result Value Ref Range Status   Colony Count NO GROWTH  Final   Organism ID, Bacteria NO GROWTH  Final  Urine culture     Status: None   Collection Time: 10/15/15 12:01 PM  Result Value Ref Range Status   Specimen Description URINE, CATHETERIZED  Final   Special Requests NONE  Final   Culture   Final    >=100,000 COLONIES/mL STAPHYLOCOCCUS SPECIES (COAGULASE NEGATIVE) Performed at [facility]    Report Status 10/18/2015 FINAL  Final   Organism ID, Bacteria STAPHYLOCOCCUS SPECIES (COAGULASE NEGATIVE)  Final      Susceptibility   Staphylococcus species (coagulase negative) - MIC*    CIPROFLOXACIN >=8 RESISTANT Resistant     GENTAMICIN <=0.5 SENSITIVE Sensitive     NITROFURANTOIN <=16 SENSITIVE Sensitive     OXACILLIN >=4 RESISTANT Resistant     TETRACYCLINE 2 SENSITIVE Sensitive     VANCOMYCIN 1 SENSITIVE Sensitive     TRIMETH/SULFA 80 RESISTANT Resistant     CLINDAMYCIN >=8 RESISTANT Resistant     RIFAMPIN <=0.5 SENSITIVE Sensitive     Inducible Clindamycin NEGATIVE Sensitive     * >=100,000 COLONIES/mL STAPHYLOCOCCUS SPECIES (COAGULASE NEGATIVE)    81 year old male with a diverting ureterectomy to his perineum with new urinary retention.  His UA and urine micro were negative. This organism likely represents a contaminated specimen and no treatment is needed.  ED Provider: Abigail Butts, PA-C   Norva Riffle 10/19/2015, 9:29 AM Infectious Diseases Pharmacist Phone# [phone]

## 2015-11-02 DIAGNOSIS — L245 Irritant contact dermatitis due to other chemical products: Secondary | ICD-10-CM | POA: Diagnosis not present

## 2015-11-02 DIAGNOSIS — L309 Dermatitis, unspecified: Secondary | ICD-10-CM | POA: Diagnosis not present

## 2015-11-04 DIAGNOSIS — Z87891 Personal history of nicotine dependence: Secondary | ICD-10-CM | POA: Diagnosis not present

## 2015-11-04 DIAGNOSIS — Z8549 Personal history of malignant neoplasm of other male genital organs: Secondary | ICD-10-CM | POA: Diagnosis not present

## 2015-11-04 DIAGNOSIS — R600 Localized edema: Secondary | ICD-10-CM | POA: Diagnosis not present

## 2015-11-04 DIAGNOSIS — R339 Retention of urine, unspecified: Secondary | ICD-10-CM | POA: Diagnosis not present

## 2015-11-04 DIAGNOSIS — Z9359 Other cystostomy status: Secondary | ICD-10-CM | POA: Diagnosis not present

## 2015-11-04 DIAGNOSIS — Z08 Encounter for follow-up examination after completed treatment for malignant neoplasm: Secondary | ICD-10-CM | POA: Diagnosis not present

## 2015-11-04 DIAGNOSIS — Z79899 Other long term (current) drug therapy: Secondary | ICD-10-CM | POA: Diagnosis not present

## 2015-11-04 DIAGNOSIS — N359 Urethral stricture, unspecified: Secondary | ICD-10-CM | POA: Diagnosis not present

## 2015-11-18 ENCOUNTER — Encounter: Payer: Self-pay | Admitting: Family Medicine

## 2015-11-18 ENCOUNTER — Ambulatory Visit (INDEPENDENT_AMBULATORY_CARE_PROVIDER_SITE_OTHER): Payer: Medicare Other | Admitting: Family Medicine

## 2015-11-18 VITALS — BP 120/66 | HR 70 | Temp 98.3°F | Wt 260.0 lb

## 2015-11-18 DIAGNOSIS — E119 Type 2 diabetes mellitus without complications: Secondary | ICD-10-CM | POA: Diagnosis not present

## 2015-11-18 DIAGNOSIS — E785 Hyperlipidemia, unspecified: Secondary | ICD-10-CM | POA: Diagnosis not present

## 2015-11-18 DIAGNOSIS — I10 Essential (primary) hypertension: Secondary | ICD-10-CM

## 2015-11-18 DIAGNOSIS — N183 Chronic kidney disease, stage 3 unspecified: Secondary | ICD-10-CM

## 2015-11-18 LAB — BASIC METABOLIC PANEL
BUN: 25 mg/dL — AB (ref 6–23)
CALCIUM: 9.7 mg/dL (ref 8.4–10.5)
CHLORIDE: 107 meq/L (ref 96–112)
CO2: 27 meq/L (ref 19–32)
CREATININE: 1.73 mg/dL — AB (ref 0.40–1.50)
GFR: 40.69 mL/min — ABNORMAL LOW (ref >60.00)
Glucose, Bld: 134 mg/dL — ABNORMAL HIGH (ref 70–99)
Potassium: 4.6 mEq/L (ref 3.5–5.1)
Sodium: 144 mEq/L (ref 135–145)

## 2015-11-18 LAB — LDL CHOLESTEROL, DIRECT: Direct LDL: 100 mg/dL

## 2015-11-18 LAB — HEMOGLOBIN A1C: Hgb A1c MFr Bld: 6.4 % (ref 4.6–6.5)

## 2015-11-18 NOTE — Patient Instructions (Signed)
Labs before you leave  Figure out if you had eye exam- if so have them send [place] a copy. If not, please get this updated and have them send [place] copy.   Follow up in 6 months  Youll be in my thoughts and prayers for your surgery

## 2015-11-18 NOTE — Assessment & Plan Note (Signed)
S: stable GFR 30-40 range previously. Avoids nsaids A/P: check GFR/Cr today

## 2015-11-18 NOTE — Assessment & Plan Note (Addendum)
S: controlled. On amlodipine 5mg  alone  BP Readings from Last 3 Encounters:  11/18/15 120/66  10/18/15 142/72  10/15/15 120/77  A/P:Continue current meds. Not clear why transition to lisinopril never happened- reviewed chart but regardless BP doing well and hopeful CKD stable. Will not attempt change as long as doing well

## 2015-11-18 NOTE — Progress Notes (Signed)
Garret Reddish, MD  Subjective:  Charles Castillo. is a 81 y.o. year old very pleasant male patient who presents for/with See problem oriented charting ROS- has catheter in place for upcoming urological surgery. No chest pain or shortness of breath. No headache or blurry vision.   Past Medical History-  Patient Active Problem List   Diagnosis Date Noted  . Diabetes mellitus ([place]) 09/15/2014    Priority: High  . Erectile dysfunction 09/15/2014    Priority: Medium  . Urethral Cancer s/p excision.  11/04/2009    Priority: Medium  . CKD (chronic kidney disease), stage III 03/24/2008    Priority: Medium  . HYPERLIPIDEMIA 08/27/2007    Priority: Medium  . Essential hypertension 07/03/2007    Priority: Medium  . SLEEP APNEA 07/03/2007    Priority: Medium  . Former smoker 09/15/2014    Priority: Low  . Obesity 10/10/2012    Priority: Low  . Osteoarthritis 07/03/2007    Priority: Low    Medications- reviewed and updated Current Outpatient Prescriptions  Medication Sig Dispense Refill  . oxybutynin (DITROPAN-XL) 5 MG 24 hr tablet Take 5 mg by mouth. Dr. Talmage Nap    . amLODipine (NORVASC) 5 MG tablet TAKE ONE TABLET BY MOUTH ONCE DAILY 90 tablet 2  . aspirin EC 81 MG tablet Take 81 mg by mouth daily.    Marland Kitchen atorvastatin (LIPITOR) 40 MG tablet Take 1 tablet (40 mg total) by mouth daily. (Patient taking differently: Take 40 mg by mouth every evening. ) 90 tablet 3  . DiphenhydrAMINE HCl, Sleep, (SOMINEX PO) Take 1 tablet by mouth at bedtime.     . Multiple Vitamin (MULTI-VITAMINS) TABS Take 1 tablet by mouth daily.    . naproxen sodium (ANAPROX) 220 MG tablet Take 220 mg by mouth daily as needed (pain).    . tadalafil (CIALIS) 20 MG tablet Take 1 tablet (20 mg total) by mouth daily as needed. 30 tablet 3   No current facility-administered medications for this visit.    Objective: BP 120/66 mmHg  Pulse 70  Temp(Src) 98.3 F (36.8 C)  Wt 260 lb (117.935 kg) Gen: NAD, resting  comfortably CV: RRR no murmurs rubs or gallops Lungs: CTAB no crackles, wheeze, rhonchi Abdomen: soft/nontender/nondistended/normal bowel sounds. No rebound or guarding. Obese.  Ext: no edema Skin: warm, dry, no rash Neuro: grossly normal, moves all extremities  Diabetic Foot Exam - Simple   Simple Foot Form  Diabetic Foot exam was performed with the following findings:  Yes 11/18/2015  9:47 AM  Visual Inspection  No deformities, no ulcerations, no other skin breakdown bilaterally:  Yes  Sensation Testing  Intact to touch and monofilament testing bilaterally:  Yes  Pulse Check  Posterior Tibialis and Dorsalis pulse intact bilaterally:  Yes  Comments    left ankle see some of equipment for indwelling catheter  Assessment/Plan:  Diabetes mellitus ([place]) S: diet controlled diabetes. Does not check sugars regularly. Has been working on losing weight Lab Results  Component Value Date   HGBA1C 6.4 03/19/2015  A/P: update a1c today, continue without medication   Essential hypertension S: controlled. On amlodipine 5mg  alone  BP Readings from Last 3 Encounters:  11/18/15 120/66  10/18/15 142/72  10/15/15 120/77  A/P:Continue current meds. Not clear why transition to lisinopril never happened- reviewed chart but regardless BP doing well and hopeful CKD stable. Will not attempt change as long as doing well   CKD (chronic kidney disease), stage III S: stable GFR 30-40  range previously. Avoids nsaids A/P: check GFR/Cr today   6 months  Orders Placed This Encounter  Procedures  . Hemoglobin A1c  . Basic metabolic panel      . LDL Cholesterol, Direct   Per urology, and stopped flomax Meds ordered this encounter  Medications  . oxybutynin (DITROPAN-XL) 5 MG 24 hr tablet    Sig: Take 5 mg by mouth. Dr. Talmage Nap    Health Maintenance Due  Topic Date Due  . OPHTHALMOLOGY EXAM - will check with wife and have records sent if had this, if not will schedule 10/15/1944   . FOOT EXAM - today 09/16/2015  . HEMOGLOBIN A1C - today 09/18/2015

## 2015-11-18 NOTE — Assessment & Plan Note (Signed)
S: diet controlled diabetes. Does not check sugars regularly. Has been working on losing weight Lab Results  Component Value Date   HGBA1C 6.4 03/19/2015  A/P: update a1c today, continue without medication

## 2015-11-19 ENCOUNTER — Encounter: Payer: Self-pay | Admitting: Family Medicine

## 2015-12-01 ENCOUNTER — Encounter: Payer: Self-pay | Admitting: Family Medicine

## 2015-12-01 DIAGNOSIS — C680 Malignant neoplasm of urethra: Secondary | ICD-10-CM | POA: Diagnosis not present

## 2015-12-01 DIAGNOSIS — G4733 Obstructive sleep apnea (adult) (pediatric): Secondary | ICD-10-CM | POA: Diagnosis not present

## 2015-12-01 DIAGNOSIS — Z8679 Personal history of other diseases of the circulatory system: Secondary | ICD-10-CM | POA: Diagnosis not present

## 2015-12-01 DIAGNOSIS — Z6834 Body mass index (BMI) 34.0-34.9, adult: Secondary | ICD-10-CM | POA: Diagnosis not present

## 2015-12-10 DIAGNOSIS — I129 Hypertensive chronic kidney disease with stage 1 through stage 4 chronic kidney disease, or unspecified chronic kidney disease: Secondary | ICD-10-CM | POA: Diagnosis not present

## 2015-12-10 DIAGNOSIS — R820 Chyluria: Secondary | ICD-10-CM | POA: Diagnosis not present

## 2015-12-10 DIAGNOSIS — N342 Other urethritis: Secondary | ICD-10-CM | POA: Diagnosis not present

## 2015-12-10 DIAGNOSIS — Z8559 Personal history of malignant neoplasm of other urinary tract organ: Secondary | ICD-10-CM | POA: Diagnosis not present

## 2015-12-10 DIAGNOSIS — N359 Urethral stricture, unspecified: Secondary | ICD-10-CM | POA: Diagnosis not present

## 2015-12-10 DIAGNOSIS — Z9889 Other specified postprocedural states: Secondary | ICD-10-CM | POA: Diagnosis not present

## 2015-12-10 DIAGNOSIS — N358 Other urethral stricture: Secondary | ICD-10-CM | POA: Diagnosis not present

## 2015-12-10 DIAGNOSIS — Z87891 Personal history of nicotine dependence: Secondary | ICD-10-CM | POA: Diagnosis not present

## 2015-12-10 DIAGNOSIS — N189 Chronic kidney disease, unspecified: Secondary | ICD-10-CM | POA: Diagnosis not present

## 2015-12-10 DIAGNOSIS — Z906 Acquired absence of other parts of urinary tract: Secondary | ICD-10-CM | POA: Diagnosis not present

## 2015-12-10 DIAGNOSIS — Z7982 Long term (current) use of aspirin: Secondary | ICD-10-CM | POA: Diagnosis not present

## 2015-12-10 DIAGNOSIS — N99524 Stenosis of incontinent stoma of urinary tract: Secondary | ICD-10-CM | POA: Diagnosis not present

## 2015-12-10 DIAGNOSIS — E785 Hyperlipidemia, unspecified: Secondary | ICD-10-CM | POA: Diagnosis not present

## 2015-12-10 DIAGNOSIS — Z936 Other artificial openings of urinary tract status: Secondary | ICD-10-CM | POA: Diagnosis not present

## 2015-12-10 DIAGNOSIS — Z79899 Other long term (current) drug therapy: Secondary | ICD-10-CM | POA: Diagnosis not present

## 2015-12-10 DIAGNOSIS — G4733 Obstructive sleep apnea (adult) (pediatric): Secondary | ICD-10-CM | POA: Diagnosis not present

## 2015-12-10 HISTORY — PX: URETHROSTOMY CLOSURE: SUR269

## 2015-12-25 DIAGNOSIS — C680 Malignant neoplasm of urethra: Secondary | ICD-10-CM | POA: Diagnosis not present

## 2016-02-04 ENCOUNTER — Encounter: Payer: Self-pay | Admitting: Family Medicine

## 2016-02-04 ENCOUNTER — Telehealth: Payer: Self-pay | Admitting: Family Medicine

## 2016-02-04 MED ORDER — ATORVASTATIN CALCIUM 40 MG PO TABS: 40.0000 mg | ORAL_TABLET | Freq: Every day | ORAL | Status: DC

## 2016-02-04 NOTE — Telephone Encounter (Signed)
[place], [place] [phone]  Requesting refill of atorvastatin (LIPITOR) 40 MG tablet

## 2016-02-04 NOTE — Telephone Encounter (Signed)
Refill sent to [facility] 

## 2016-02-14 ENCOUNTER — Other Ambulatory Visit: Payer: Self-pay | Admitting: Family Medicine

## 2016-03-30 DIAGNOSIS — C680 Malignant neoplasm of urethra: Secondary | ICD-10-CM | POA: Diagnosis not present

## 2016-04-05 ENCOUNTER — Ambulatory Visit (INDEPENDENT_AMBULATORY_CARE_PROVIDER_SITE_OTHER): Payer: Medicare Other | Admitting: Family Medicine

## 2016-04-05 ENCOUNTER — Encounter: Payer: Self-pay | Admitting: Family Medicine

## 2016-04-05 VITALS — BP 130/62 | HR 84 | Temp 98.4°F | Wt 274.0 lb

## 2016-04-05 DIAGNOSIS — M79674 Pain in right toe(s): Secondary | ICD-10-CM

## 2016-04-05 MED ORDER — COLCHICINE 0.6 MG PO TABS: ORAL_TABLET | ORAL | Status: DC

## 2016-04-05 NOTE — Progress Notes (Signed)
Garret Reddish, MD  Subjective:  Charles Castillo. is a 82 y.o. year old very pleasant male patient who presents for/with See problem oriented charting ROS- no fever, chills, nausea, vomiting. No expanding redness on toe.   Past Medical History-  Patient Active Problem List   Diagnosis Date Noted  . Diabetes mellitus ([place]) 09/15/2014    Priority: High  . Erectile dysfunction 09/15/2014    Priority: Medium  . Urethral Cancer s/p excision.  11/04/2009    Priority: Medium  . CKD (chronic kidney disease), stage III 03/24/2008    Priority: Medium  . HYPERLIPIDEMIA 08/27/2007    Priority: Medium  . Essential hypertension 07/03/2007    Priority: Medium  . SLEEP APNEA 07/03/2007    Priority: Medium  . Former smoker 09/15/2014    Priority: Low  . Obesity 10/10/2012    Priority: Low  . Osteoarthritis 07/03/2007    Priority: Low    Medications- reviewed and updated Current Outpatient Prescriptions  Medication Sig Dispense Refill  . amLODipine (NORVASC) 5 MG tablet TAKE 1 TABLET EVERY DAY 90 tablet 2  . aspirin EC 81 MG tablet Take 81 mg by mouth daily.    Marland Kitchen atorvastatin (LIPITOR) 40 MG tablet Take 1 tablet (40 mg total) by mouth daily. 90 tablet 3  . DiphenhydrAMINE HCl, Sleep, (SOMINEX PO) Take 1 tablet by mouth at bedtime.      No current facility-administered medications for this visit.    Objective: BP 130/62 mmHg  Pulse 84  Temp(Src) 98.4 F (36.9 C)  Wt 274 lb (124.286 kg) Gen: NAD, resting comfortably CV: RRR no murmurs rubs or gallops Lungs: CTAB no crackles, wheeze, rhonchi Ext: no edema Right great toe at MTP joint with erythema and warmth. Pain to palpation throughout joint. No pain at IP joint. Rest of foot normal.  Skin: warm, dry, no rash. Only erythema noted at MTP joint Neuro: grossly normal, moves all extremities, intact distal sensation  Assessment/Plan:  Right big toe pain S: bothering him for 2-3 days. Started Sunday night not being able to sleep,  trouble walking due to pain. Never had anything like this before. Thought it was a sprain at first but no injury or fall. 8/10 pain with movement. Wakes him up at night. Sitting down helps with pain/inactivity down to 4/10. Never diagnosed with gout in past. No treatments to date.  A/P: concern for gout but given age and never had prior episode with no clear trigger this time- also get x-ray (no clear trauma or injury though). Pseudogout also possible. Will trail treatment with colcrys. If not covered- consider NSAIDS (but kidney disease stage III) or prednisone (would treat pseudogout as well). Has follow up within a few months and will check uric acid  Return precautions advised.   Orders Placed This Encounter  Procedures  . DG Toe Great Right    Standing Status: Future     Number of Occurrences:      Standing Expiration Date: 06/05/2017    Order Specific Question:  Reason for Exam (SYMPTOM  OR DIAGNOSIS REQUIRED)    Answer:  pain at MTP joint- suspect gout    Order Specific Question:  Preferred imaging location?    Answer:  [facility]    Meds ordered this encounter  Medications  . colchicine (COLCRYS) 0.6 MG tablet    Sig: Take 2 pills at first sign of gout flare and may repeat 1 pill in 1 hour. Then take 1 pill a day until resolved  not more than 2 weeks    Dispense:  30 tablet    Refill:  0

## 2016-04-05 NOTE — Patient Instructions (Signed)
Hold your atorvastatin while taking colcrys  Get x-ray at [facility] as instructed   Gout Gout is an inflammatory arthritis caused by a buildup of uric acid crystals in the joints. Uric acid is a chemical that is normally present in the blood. When the level of uric acid in the blood is too high it can form crystals that deposit in your joints and tissues. This causes joint redness, soreness, and swelling (inflammation). Repeat attacks are common. Over time, uric acid crystals can form into masses (tophi) near a joint, destroying bone and causing disfigurement. Gout is treatable and often preventable. CAUSES  The disease begins with elevated levels of uric acid in the blood. Uric acid is produced by your body when it breaks down a naturally found substance called purines. Certain foods you eat, such as meats and fish, contain high amounts of purines. Causes of an elevated uric acid level include:  Being passed down from parent to child (heredity).  Diseases that cause increased uric acid production (such as obesity, psoriasis, and certain cancers).  Excessive alcohol use.  Diet, especially diets rich in meat and seafood.  Medicines, including certain cancer-fighting medicines (chemotherapy), water pills (diuretics), and aspirin.  Chronic kidney disease. The kidneys are no longer able to remove uric acid well.  Problems with metabolism. Conditions strongly associated with gout include:  Obesity.  High blood pressure.  High cholesterol.  Diabetes. Not everyone with elevated uric acid levels gets gout. It is not understood why some people get gout and others do not. Surgery, joint injury, and eating too much of certain foods are some of the factors that can lead to gout attacks. SYMPTOMS   An attack of gout comes on quickly. It causes intense pain with redness, swelling, and warmth in a joint.  Fever can occur.  Often, only one joint is involved. Certain joints  are more commonly involved:  Base of the big toe.  Knee.  Ankle.  Wrist.  Finger. Without treatment, an attack usually goes away in a few days to weeks. Between attacks, you usually will not have symptoms, which is different from many other forms of arthritis. DIAGNOSIS  Your caregiver will suspect gout based on your symptoms and exam. In some cases, tests may be recommended. The tests may include:  Blood tests.  Urine tests.  X-rays.  Joint fluid exam. This exam requires a needle to remove fluid from the joint (arthrocentesis). Using a microscope, gout is confirmed when uric acid crystals are seen in the joint fluid. TREATMENT  There are two phases to gout treatment: treating the sudden onset (acute) attack and preventing attacks (prophylaxis).  Treatment of an Acute Attack.  Medicines are used. These include anti-inflammatory medicines or steroid medicines.  An injection of steroid medicine into the affected joint is sometimes necessary.  The painful joint is rested. Movement can worsen the arthritis.  You may use warm or cold treatments on painful joints, depending which works best for you.  Treatment to Prevent Attacks.  If you suffer from frequent gout attacks, your caregiver may advise preventive medicine. These medicines are started after the acute attack subsides. These medicines either help your kidneys eliminate uric acid from your body or decrease your uric acid production. You may need to stay on these medicines for a very long time.  The early phase of treatment with preventive medicine can be associated with an increase in acute gout attacks. For this reason, during the first few months of  treatment, your caregiver may also advise you to take medicines usually used for acute gout treatment. Be sure you understand your caregiver's directions. Your caregiver may make several adjustments to your medicine dose before these medicines are effective.  Discuss dietary  treatment with your caregiver or dietitian. Alcohol and drinks high in sugar and fructose and foods such as meat, poultry, and seafood can increase uric acid levels. Your caregiver or dietitian can advise you on drinks and foods that should be limited. HOME CARE INSTRUCTIONS   Do not take aspirin to relieve pain. This raises uric acid levels.  Only take over-the-counter or prescription medicines for pain, discomfort, or fever as directed by your caregiver.  Rest the joint as much as possible. When in bed, keep sheets and blankets off painful areas.  Keep the affected joint raised (elevated).  Apply warm or cold treatments to painful joints. Use of warm or cold treatments depends on which works best for you.  Use crutches if the painful joint is in your leg.  Drink enough fluids to keep your urine clear or pale yellow. This helps your body get rid of uric acid. Limit alcohol, sugary drinks, and fructose drinks.  Follow your dietary instructions. Pay careful attention to the amount of protein you eat. Your daily diet should emphasize fruits, vegetables, whole grains, and fat-free or low-fat milk products. Discuss the use of coffee, vitamin C, and cherries with your caregiver or dietitian. These may be helpful in lowering uric acid levels.  Maintain a healthy body weight. SEEK MEDICAL CARE IF:   You develop diarrhea, vomiting, or any side effects from medicines.  You do not feel better in 24 hours, or you are getting worse. SEEK IMMEDIATE MEDICAL CARE IF:   Your joint becomes suddenly more tender, and you have chills or a fever. MAKE SURE YOU:   Understand these instructions.  Will watch your condition.  Will get help right away if you are not doing well or get worse.   This information is not intended to replace advice given to you by your health care provider. Make sure you discuss any questions you have with your health care provider.   Document Released: 12/02/2000 Document  Revised: 12/26/2014 Document Reviewed: 07/18/2012 Elsevier Interactive Patient Education [employer].

## 2016-04-06 ENCOUNTER — Ambulatory Visit (INDEPENDENT_AMBULATORY_CARE_PROVIDER_SITE_OTHER)
Admission: RE | Admit: 2016-04-06 | Discharge: 2016-04-06 | Disposition: A | Discharge status: Home / Self Care | Payer: Medicare Other | Source: Ambulatory Visit | Attending: Family Medicine | Admitting: Family Medicine

## 2016-04-06 DIAGNOSIS — M79674 Pain in right toe(s): Secondary | ICD-10-CM

## 2016-04-06 DIAGNOSIS — M7989 Other specified soft tissue disorders: Secondary | ICD-10-CM | POA: Diagnosis not present

## 2016-05-24 ENCOUNTER — Ambulatory Visit (INDEPENDENT_AMBULATORY_CARE_PROVIDER_SITE_OTHER): Payer: Medicare Other | Admitting: Family Medicine

## 2016-05-24 ENCOUNTER — Encounter: Payer: Self-pay | Admitting: Family Medicine

## 2016-05-24 VITALS — BP 110/64 | HR 76 | Temp 98.1°F | Ht 74.0 in | Wt 274.0 lb

## 2016-05-24 DIAGNOSIS — I10 Essential (primary) hypertension: Secondary | ICD-10-CM | POA: Diagnosis not present

## 2016-05-24 DIAGNOSIS — N183 Chronic kidney disease, stage 3 unspecified: Secondary | ICD-10-CM

## 2016-05-24 DIAGNOSIS — E119 Type 2 diabetes mellitus without complications: Secondary | ICD-10-CM | POA: Diagnosis not present

## 2016-05-24 DIAGNOSIS — M79674 Pain in right toe(s): Secondary | ICD-10-CM | POA: Diagnosis not present

## 2016-05-24 DIAGNOSIS — E785 Hyperlipidemia, unspecified: Secondary | ICD-10-CM | POA: Diagnosis not present

## 2016-05-24 DIAGNOSIS — M109 Gout, unspecified: Secondary | ICD-10-CM

## 2016-05-24 LAB — URIC ACID: Uric Acid, Serum: 8.2 mg/dL — ABNORMAL HIGH (ref 4.0–7.8)

## 2016-05-24 LAB — CBC
HCT: 45.7 % (ref 39.0–52.0)
Hemoglobin: 15.4 g/dL (ref 13.0–17.0)
MCHC: 33.6 g/dL (ref 30.0–36.0)
MCV: 89.1 fl (ref 78.0–100.0)
PLATELETS: 208 10*3/uL (ref 150.0–400.0)
RBC: 5.13 Mil/uL (ref 4.22–5.81)
RDW: 13.6 % (ref 11.5–15.5)
WBC: 6.1 10*3/uL (ref 4.0–10.5)

## 2016-05-24 LAB — COMPREHENSIVE METABOLIC PANEL
ALK PHOS: 92 U/L (ref 39–117)
ALT: 21 U/L (ref 0–53)
AST: 17 U/L (ref 0–37)
Albumin: 4.1 g/dL (ref 3.5–5.2)
BUN: 27 mg/dL — ABNORMAL HIGH (ref 6–23)
CO2: 26 meq/L (ref 19–32)
Calcium: 10.3 mg/dL (ref 8.4–10.5)
Chloride: 106 mEq/L (ref 96–112)
Creatinine, Ser: 1.78 mg/dL — ABNORMAL HIGH (ref 0.40–1.50)
GFR: 39.32 mL/min — AB (ref >60.00)
GLUCOSE: 138 mg/dL — AB (ref 70–99)
POTASSIUM: 4.3 meq/L (ref 3.5–5.1)
Sodium: 141 mEq/L (ref 135–145)
TOTAL PROTEIN: 7.6 g/dL (ref 6.0–8.3)
Total Bilirubin: 0.6 mg/dL (ref 0.2–1.2)

## 2016-05-24 LAB — MICROALBUMIN / CREATININE URINE RATIO
CREATININE, U: 167.5 mg/dL
MICROALB UR: 11.6 mg/dL — AB (ref 0.0–1.9)
MICROALB/CREAT RATIO: 6.9 mg/g (ref 0.0–30.0)

## 2016-05-24 LAB — HEMOGLOBIN A1C: HEMOGLOBIN A1C: 6.1 % (ref 4.6–6.5)

## 2016-05-24 NOTE — Assessment & Plan Note (Signed)
S: pain in toe responded to colcrys over 8-9 days completely resolved A/P: check uric acid today, has only had 1 flare so hesitant to start allopurinol if above 6 but will discuss option with patient

## 2016-05-24 NOTE — Assessment & Plan Note (Signed)
S: well controlled. On no medication. Diet controlled. In past CBGs- No regular checks Exercise and diet- weight up 4 lbs in 6 months.  Last recording likely actually 270.  Lab Results  Component Value Date   HGBA1C 6.4 11/18/2015   HGBA1C 6.4 03/19/2015   HGBA1C 6.2 10/03/2014   A/P: update a1c and microalbumin/creatinine ratio today

## 2016-05-24 NOTE — Progress Notes (Signed)
Subjective:  Charles Castillo. is a 82 y.o. year old very pleasant male patient who presents for/with See problem oriented charting ROS- No chest pain or shortness of breath. No headache or blurry vision.see any ROS included in HPI as well.   Past Medical History-  Patient Active Problem List   Diagnosis Date Noted  . Diabetes mellitus ([place]) 09/15/2014    Priority: High  . Erectile dysfunction 09/15/2014    Priority: Medium  . Urethral Cancer s/p excision.  11/04/2009    Priority: Medium  . CKD (chronic kidney disease), stage III 03/24/2008    Priority: Medium  . Hyperlipidemia 08/27/2007    Priority: Medium  . Essential hypertension 07/03/2007    Priority: Medium  . SLEEP APNEA 07/03/2007    Priority: Medium  . Former smoker 09/15/2014    Priority: Low  . Obesity 10/10/2012    Priority: Low  . Osteoarthritis 07/03/2007    Priority: Low  . Gout 05/24/2016    Medications- reviewed and updated Current Outpatient Prescriptions  Medication Sig Dispense Refill  . amLODipine (NORVASC) 5 MG tablet TAKE 1 TABLET EVERY DAY 90 tablet 2  . aspirin EC 81 MG tablet Take 81 mg by mouth daily.    Marland Kitchen atorvastatin (LIPITOR) 40 MG tablet Take 1 tablet (40 mg total) by mouth daily. 90 tablet 3  . DiphenhydrAMINE HCl, Sleep, (SOMINEX PO) Take 1 tablet by mouth at bedtime.     . tadalafil (CIALIS) 10 MG tablet Take 10 mg by mouth daily as needed for erectile dysfunction.     No current facility-administered medications for this visit.    Objective: BP 110/64 mmHg  Pulse 76  Temp(Src) 98.1 F (36.7 C) (Oral)  Ht 6\' 2"  (1.88 m)  Wt 274 lb (124.286 kg)  BMI 35.16 kg/m2 Gen: NAD, resting comfortably CV: RRR no murmurs rubs or gallops Lungs: CTAB no crackles, wheeze, rhonchi Abdomen: soft/nontender/nondistended/normal bowel sounds. obesity Ext: no edema Skin: warm, dry, no rash Neuro: grossly normal, moves all extremities  Assessment/Plan:  Diabetes mellitus ([place]) S: well  controlled. On no medication. Diet controlled. In past CBGs- No regular checks Exercise and diet- weight up 4 lbs in 6 months.  Last recording likely actually 270.  Lab Results  Component Value Date   HGBA1C 6.4 11/18/2015   HGBA1C 6.4 03/19/2015   HGBA1C 6.2 10/03/2014   A/P: update a1c and microalbumin/creatinine ratio today  CKD (chronic kidney disease), stage III Hypertension S: controlled. On amlodipine 5mg  alone . We had tried to transition to lisinopril but never happened for unclear reasons. GFR stable in 30 and 40 range BP Readings from Last 3 Encounters:  05/24/16 110/64  04/05/16 130/62  11/18/15 120/66  A/P:Continue current meds:  Update GFR today. If gfr worsens or microalbumin/cr over 30- change to ace inhibitor  Gout S: pain in toe responded to colcrys over 8-9 days completely resolved A/P: check uric acid today, has only had 1 flare so hesitant to start allopurinol if above 6 but will discuss option with patient   Hyperlipdemia S: reasonably controlled on atorvastatin 40mg  changed march 2016. No myalgias.  Lab Results  Component Value Date   CHOL 181 05/05/2014   HDL 49.90 05/05/2014   LDLCALC 119* 05/05/2014   LDLDIRECT 100.0 11/18/2015   TRIG 60.0 05/05/2014   CHOLHDL 4 05/05/2014   A/P: continue current rx- doing reasonably well  Return in about 6 months (around 11/23/2016) for follow up- or sooner if needed. Return precautions advised.  Orders Placed This Encounter  Procedures  . Hemoglobin A1c    [place]  . Microalbumin / creatinine urine ratio    [place]  . Comprehensive metabolic panel    [place]  . Uric Acid  . CBC      interpret urine in light of urethrectomy  Meds ordered this encounter  Medications  . tadalafil (CIALIS) 10 MG tablet    Sig: Take 10 mg by mouth daily as needed for erectile dysfunction.    Garret Reddish, MD

## 2016-05-24 NOTE — Patient Instructions (Addendum)
Labs before you leave  Try to get those 4 extra pounds back off  Otherwise no changes in medications unless labs lead [place] to that  Health Maintenance Due  Topic Date Due  . OPHTHALMOLOGY EXAM - get eye exam done and have copy sent to [place] 10/21/2015   I would also like for you to sign up for an annual wellness visit on a Friday with our nurse Manuela Schwartz. This is a free benefit under medicare that may help [place] find additional ways to help you.

## 2016-05-24 NOTE — Assessment & Plan Note (Signed)
Hypertension S: controlled. On amlodipine 5mg  alone . We had tried to transition to lisinopril but never happened for unclear reasons. GFR stable in 30 and 40 range BP Readings from Last 3 Encounters:  05/24/16 110/64  04/05/16 130/62  11/18/15 120/66  A/P:Continue current meds:  Update GFR today. If gfr worsens or microalbumin/cr over 30- change to ace inhibitor

## 2016-06-09 DIAGNOSIS — E13319 Other specified diabetes mellitus with unspecified diabetic retinopathy without macular edema: Secondary | ICD-10-CM | POA: Diagnosis not present

## 2016-06-09 DIAGNOSIS — H26492 Other secondary cataract, left eye: Secondary | ICD-10-CM | POA: Diagnosis not present

## 2016-06-09 DIAGNOSIS — H43811 Vitreous degeneration, right eye: Secondary | ICD-10-CM | POA: Diagnosis not present

## 2016-06-09 LAB — HM DIABETES EYE EXAM

## 2016-06-10 ENCOUNTER — Ambulatory Visit: Payer: Medicare Other

## 2016-06-17 ENCOUNTER — Ambulatory Visit: Payer: Medicare Other

## 2016-06-17 ENCOUNTER — Ambulatory Visit (INDEPENDENT_AMBULATORY_CARE_PROVIDER_SITE_OTHER): Payer: Medicare Other

## 2016-06-17 ENCOUNTER — Telehealth: Payer: Self-pay

## 2016-06-17 VITALS — BP 120/68 | HR 76 | Ht 74.0 in | Wt 272.4 lb

## 2016-06-17 DIAGNOSIS — Z0000 Encounter for general adult medical examination without abnormal findings: Secondary | ICD-10-CM

## 2016-06-17 NOTE — Telephone Encounter (Signed)
He can trial half of the 20mg  tablet if he would like or I am ok with 10mg  being sent in. 10-20 mg is reasonable as needed for erectile dysfunction. Thanks for getting his eye exam report requested as well!

## 2016-06-17 NOTE — Patient Instructions (Signed)
[name] , Thank you for taking time to come for your Medicare Wellness Visit. I appreciate your ongoing commitment to your health goals. Please review the following plan we discussed and let me know if I can assist you in the future.   Will check on cialis  20 mg to 10mg ;  Will bring copy of advanced Directive (HCPOA and LW)   These are the goals we discussed: Goals    . Weight < 260 lb (117.935 kg)     Cut back on portions;   Controllable  risk for heart disease reviewed for goal setting: Includes; BP control <140/80; monitoring renal disease; obesity (BMI >30); sedentary lifestyle Educated regarding Metabolic syndrome / Excess weight around the waist;  Waist circumference for Men >40  Triglycerides > 150 (60) HDL < 50 (49.9) BP > 130/85  Glucose > 100 Plan Lifestyle changes  Heart Healthy Diet; less sugar  Mediterranean Diet: eating primarily plant-based food such as fruits and vegetables, whole grains, legumes and nuts; replacing butter with healthy fats such as olive oil and canola oil Using herbs and spices instead of salt to flavor food Limiting red meat to no more than a few times a month Eating fish and poultry at least 2 times a week Getting plenty of exercise         This is a list of the screening recommended for you and due dates:  Health Maintenance  Topic Date Due  . Eye exam for diabetics  10/21/2015  . Flu Shot  07/19/2016  . Complete foot exam   11/17/2016  . Hemoglobin A1C  11/23/2016  . Urine Protein Check  05/24/2017  . Tetanus Vaccine  11/01/2020  . Pneumonia vaccines  Completed     eye exam 6/22  And will confirm via Dr. Manuella Ghazi.  Fall Prevention in the Home  Falls can cause injuries. They can happen to people of all ages. There are many things you can do to make your home safe and to help prevent falls.  WHAT CAN I DO ON THE OUTSIDE OF MY HOME?  Regularly fix the edges of walkways and driveways and fix any cracks.  Remove anything that might  make you trip as you walk through a door, such as a raised step or threshold.  Trim any bushes or trees on the path to your home.  Use bright outdoor lighting.  Clear any walking paths of anything that might make someone trip, such as rocks or tools.  Regularly check to see if handrails are loose or broken. Make sure that both sides of any steps have handrails.  Any raised decks and porches should have guardrails on the edges.  Have any leaves, snow, or ice cleared regularly.  Use sand or salt on walking paths during winter.  Clean up any spills in your garage right away. This includes oil or grease spills. WHAT CAN I DO IN THE BATHROOM?   Use night lights.  Install grab bars by the toilet and in the tub and shower. Do not use towel bars as grab bars.  Use non-skid mats or decals in the tub or shower.  If you need to sit down in the shower, use a plastic, non-slip stool.  Keep the floor dry. Clean up any water that spills on the floor as soon as it happens.  Remove soap buildup in the tub or shower regularly.  Attach bath mats securely with double-sided non-slip rug tape.  Do not have throw rugs and other  things on the floor that can make you trip. WHAT CAN I DO IN THE BEDROOM?  Use night lights.  Make sure that you have a light by your bed that is easy to reach.  Do not use any sheets or blankets that are too big for your bed. They should not hang down onto the floor.  Have a firm chair that has side arms. You can use this for support while you get dressed.  Do not have throw rugs and other things on the floor that can make you trip. WHAT CAN I DO IN THE KITCHEN?  Clean up any spills right away.  Avoid walking on wet floors.  Keep items that you use a lot in easy-to-reach places.  If you need to reach something above you, use a strong step stool that has a grab bar.  Keep electrical cords out of the way.  Do not use floor polish or wax that makes floors  slippery. If you must use wax, use non-skid floor wax.  Do not have throw rugs and other things on the floor that can make you trip. WHAT CAN I DO WITH MY STAIRS?  Do not leave any items on the stairs.  Make sure that there are handrails on both sides of the stairs and use them. Fix handrails that are broken or loose. Make sure that handrails are as long as the stairways.  Check any carpeting to make sure that it is firmly attached to the stairs. Fix any carpet that is loose or worn.  Avoid having throw rugs at the top or bottom of the stairs. If you do have throw rugs, attach them to the floor with carpet tape.  Make sure that you have a light switch at the top of the stairs and the bottom of the stairs. If you do not have them, ask someone to add them for you. WHAT ELSE CAN I DO TO HELP PREVENT FALLS?  Wear shoes that:  Do not have high heels.  Have rubber bottoms.  Are comfortable and fit you well.  Are closed at the toe. Do not wear sandals.  If you use a stepladder:  Make sure that it is fully opened. Do not climb a closed stepladder.  Make sure that both sides of the stepladder are locked into place.  Ask someone to hold it for you, if possible.  Clearly mark and make sure that you can see:  Any grab bars or handrails.  First and last steps.  Where the edge of each step is.  Use tools that help you move around (mobility aids) if they are needed. These include:  Canes.  Walkers.  Scooters.  Crutches.  Turn on the lights when you go into a dark area. Replace any light bulbs as soon as they burn out.  Set up your furniture so you have a clear path. Avoid moving your furniture around.  If any of your floors are uneven, fix them.  If there are any pets around you, be aware of where they are.  Review your medicines with your doctor. Some medicines can make you feel dizzy. This can increase your chance of falling. Ask your doctor what other things that you  can do to help prevent falls.   This information is not intended to replace advice given to you by your health care provider. Make sure you discuss any questions you have with your health care provider.   Document Released: 10/01/2009 Document Revised: 04/21/2015 Document Reviewed:  01/09/2015 Elsevier Interactive Patient Education 2016 [place] Maintenance, Male A healthy lifestyle and preventative care can promote health and wellness.  Maintain regular health, dental, and eye exams.  Eat a healthy diet. Foods like vegetables, fruits, whole grains, low-fat dairy products, and lean protein foods contain the nutrients you need and are low in calories. Decrease your intake of foods high in solid fats, added sugars, and salt. Get information about a proper diet from your health care provider, if necessary.  Regular physical exercise is one of the most important things you can do for your health. Most adults should get at least 150 minutes of moderate-intensity exercise (any activity that increases your heart rate and causes you to sweat) each week. In addition, most adults need muscle-strengthening exercises on 2 or more days a week.   Maintain a healthy weight. The body mass index (BMI) is a screening tool to identify possible weight problems. It provides an estimate of body fat based on height and weight. Your health care provider can find your BMI and can help you achieve or maintain a healthy weight. For males 20 years and older:  A BMI below 18.5 is considered underweight.  A BMI of 18.5 to 24.9 is normal.  A BMI of 25 to 29.9 is considered overweight.  A BMI of 30 and above is considered obese.  Maintain normal blood lipids and cholesterol by exercising and minimizing your intake of saturated fat. Eat a balanced diet with plenty of fruits and vegetables. Blood tests for lipids and cholesterol should begin at age 50 and be repeated every 5 years. If your lipid or cholesterol  levels are high, you are over age 32, or you are at high risk for heart disease, you may need your cholesterol levels checked more frequently.Ongoing high lipid and cholesterol levels should be treated with medicines if diet and exercise are not working.  If you smoke, find out from your health care provider how to quit. If you do not use tobacco, do not start.  Lung cancer screening is recommended for adults aged 55-80 years who are at high risk for developing lung cancer because of a history of smoking. A yearly low-dose CT scan of the lungs is recommended for people who have at least a 30-pack-year history of smoking and are current smokers or have quit within the past 15 years. A pack year of smoking is smoking an average of 1 pack of cigarettes a day for 1 year (for example, a 30-pack-year history of smoking could mean smoking 1 pack a day for 30 years or 2 packs a day for 15 years). Yearly screening should continue until the smoker has stopped smoking for at least 15 years. Yearly screening should be stopped for people who develop a health problem that would prevent them from having lung cancer treatment.  If you choose to drink alcohol, do not have more than 2 drinks per day. One drink is considered to be 12 oz (360 mL) of beer, 5 oz (150 mL) of wine, or 1.5 oz (45 mL) of liquor.  Avoid the use of street drugs. Do not share needles with anyone. Ask for help if you need support or instructions about stopping the use of drugs.  High blood pressure causes heart disease and increases the risk of stroke. High blood pressure is more likely to develop in:  People who have blood pressure in the end of the normal range (100-139/85-89 mm Hg).  People who are overweight  or obese.  People who are African American.  If you are 62-23 years of age, have your blood pressure checked every 3-5 years. If you are 53 years of age or older, have your blood pressure checked every year. You should have your blood  pressure measured twice--once when you are at a hospital or clinic, and once when you are not at a hospital or clinic. Record the average of the two measurements. To check your blood pressure when you are not at a hospital or clinic, you can use:  An automated blood pressure machine at a pharmacy.  A home blood pressure monitor.  If you are 26-31 years old, ask your health care provider if you should take aspirin to prevent heart disease.  Diabetes screening involves taking a blood sample to check your fasting blood sugar level. This should be done once every 3 years after age 53 if you are at a normal weight and without risk factors for diabetes. Testing should be considered at a younger age or be carried out more frequently if you are overweight and have at least 1 risk factor for diabetes.  Colorectal cancer can be detected and often prevented. Most routine colorectal cancer screening begins at the age of 11 and continues through age 15. However, your health care provider may recommend screening at an earlier age if you have risk factors for colon cancer. On a yearly basis, your health care provider may provide home test kits to check for hidden blood in the stool. A small camera at the end of a tube may be used to directly examine the colon (sigmoidoscopy or colonoscopy) to detect the earliest forms of colorectal cancer. Talk to your health care provider about this at age 38 when routine screening begins. A direct exam of the colon should be repeated every 5-10 years through age 45, unless early forms of precancerous polyps or small growths are found.  People who are at an increased risk for hepatitis B should be screened for this virus. You are considered at high risk for hepatitis B if:  You were born in a country where hepatitis B occurs often. Talk with your health care provider about which countries are considered high risk.  Your parents were born in a high-risk country and you have not  received a shot to protect against hepatitis B (hepatitis B vaccine).  You have HIV or AIDS.  You use needles to inject street drugs.  You live with, or have sex with, someone who has hepatitis B.  You are a man who has sex with other men (MSM).  You get hemodialysis treatment.  You take certain medicines for conditions like cancer, organ transplantation, and autoimmune conditions.  Hepatitis C blood testing is recommended for all people born from [birth year] through 1963 and any individual with known risk factors for hepatitis C.  Healthy men should no longer receive prostate-specific antigen (PSA) blood tests as part of routine cancer screening. Talk to your health care provider about prostate cancer screening.  Testicular cancer screening is not recommended for adolescents or adult males who have no symptoms. Screening includes self-exam, a health care provider exam, and other screening tests. Consult with your health care provider about any symptoms you have or any concerns you have about testicular cancer.  Practice safe sex. Use condoms and avoid high-risk sexual practices to reduce the spread of sexually transmitted infections (STIs).  You should be screened for STIs, including gonorrhea and chlamydia if:  You are sexually  active and are younger than 24 years.  You are older than 24 years, and your health care provider tells you that you are at risk for this type of infection.  Your sexual activity has changed since you were last screened, and you are at an increased risk for chlamydia or gonorrhea. Ask your health care provider if you are at risk.  If you are at risk of being infected with HIV, it is recommended that you take a prescription medicine daily to prevent HIV infection. This is called pre-exposure prophylaxis (PrEP). You are considered at risk if:  You are a man who has sex with other men (MSM).  You are a heterosexual man who is sexually active with multiple  partners.  You take drugs by injection.  You are sexually active with a partner who has HIV.  Talk with your health care provider about whether you are at high risk of being infected with HIV. If you choose to begin PrEP, you should first be tested for HIV. You should then be tested every 3 months for as long as you are taking PrEP.  Use sunscreen. Apply sunscreen liberally and repeatedly throughout the day. You should seek shade when your shadow is shorter than you. Protect yourself by wearing long sleeves, pants, a wide-brimmed hat, and sunglasses year round whenever you are outdoors.  Tell your health care provider of new moles or changes in moles, especially if there is a change in shape or color. Also, tell your health care provider if a mole is larger than the size of a pencil eraser.  A one-time screening for abdominal aortic aneurysm (AAA) and surgical repair of large AAAs by ultrasound is recommended for men aged 61-75 years who are current or former smokers.  Stay current with your vaccines (immunizations).   This information is not intended to replace advice given to you by your health care provider. Make sure you discuss any questions you have with your health care provider.   Document Released: 06/02/2008 Document Revised: 12/26/2014 Document Reviewed: 05/02/2011 Elsevier Interactive Patient Education 2016 [place] in the Home  Falls can cause injuries. They can happen to people of all ages. There are many things you can do to make your home safe and to help prevent falls.  WHAT CAN I DO ON THE OUTSIDE OF MY HOME?  Regularly fix the edges of walkways and driveways and fix any cracks.  Remove anything that might make you trip as you walk through a door, such as a raised step or threshold.  Trim any bushes or trees on the path to your home.  Use bright outdoor lighting.  Clear any walking paths of anything that might make someone trip, such as rocks  or tools.  Regularly check to see if handrails are loose or broken. Make sure that both sides of any steps have handrails.  Any raised decks and porches should have guardrails on the edges.  Have any leaves, snow, or ice cleared regularly.  Use sand or salt on walking paths during winter.  Clean up any spills in your garage right away. This includes oil or grease spills. WHAT CAN I DO IN THE BATHROOM?   Use night lights.  Install grab bars by the toilet and in the tub and shower. Do not use towel bars as grab bars.  Use non-skid mats or decals in the tub or shower.  If you need to sit down in the shower, use a plastic, non-slip  stool.  Keep the floor dry. Clean up any water that spills on the floor as soon as it happens.  Remove soap buildup in the tub or shower regularly.  Attach bath mats securely with double-sided non-slip rug tape.  Do not have throw rugs and other things on the floor that can make you trip. WHAT CAN I DO IN THE BEDROOM?  Use night lights.  Make sure that you have a light by your bed that is easy to reach.  Do not use any sheets or blankets that are too big for your bed. They should not hang down onto the floor.  Have a firm chair that has side arms. You can use this for support while you get dressed.  Do not have throw rugs and other things on the floor that can make you trip. WHAT CAN I DO IN THE KITCHEN?  Clean up any spills right away.  Avoid walking on wet floors.  Keep items that you use a lot in easy-to-reach places.  If you need to reach something above you, use a strong step stool that has a grab bar.  Keep electrical cords out of the way.  Do not use floor polish or wax that makes floors slippery. If you must use wax, use non-skid floor wax.  Do not have throw rugs and other things on the floor that can make you trip. WHAT CAN I DO WITH MY STAIRS?  Do not leave any items on the stairs.  Make sure that there are handrails on both  sides of the stairs and use them. Fix handrails that are broken or loose. Make sure that handrails are as long as the stairways.  Check any carpeting to make sure that it is firmly attached to the stairs. Fix any carpet that is loose or worn.  Avoid having throw rugs at the top or bottom of the stairs. If you do have throw rugs, attach them to the floor with carpet tape.  Make sure that you have a light switch at the top of the stairs and the bottom of the stairs. If you do not have them, ask someone to add them for you. WHAT ELSE CAN I DO TO HELP PREVENT FALLS?  Wear shoes that:  Do not have high heels.  Have rubber bottoms.  Are comfortable and fit you well.  Are closed at the toe. Do not wear sandals.  If you use a stepladder:  Make sure that it is fully opened. Do not climb a closed stepladder.  Make sure that both sides of the stepladder are locked into place.  Ask someone to hold it for you, if possible.  Clearly mark and make sure that you can see:  Any grab bars or handrails.  First and last steps.  Where the edge of each step is.  Use tools that help you move around (mobility aids) if they are needed. These include:  Canes.  Walkers.  Scooters.  Crutches.  Turn on the lights when you go into a dark area. Replace any light bulbs as soon as they burn out.  Set up your furniture so you have a clear path. Avoid moving your furniture around.  If any of your floors are uneven, fix them.  If there are any pets around you, be aware of where they are.  Review your medicines with your doctor. Some medicines can make you feel dizzy. This can increase your chance of falling. Ask your doctor what other things that you  can do to help prevent falls.   This information is not intended to replace advice given to you by your health care provider. Make sure you discuss any questions you have with your health care provider.   Document Released: 10/01/2009 Document  Revised: 04/21/2015 Document Reviewed: 01/09/2015 Elsevier Interactive Patient Education 2016 [place] Maintenance, Male A healthy lifestyle and preventative care can promote health and wellness.  Maintain regular health, dental, and eye exams.  Eat a healthy diet. Foods like vegetables, fruits, whole grains, low-fat dairy products, and lean protein foods contain the nutrients you need and are low in calories. Decrease your intake of foods high in solid fats, added sugars, and salt. Get information about a proper diet from your health care provider, if necessary.  Regular physical exercise is one of the most important things you can do for your health. Most adults should get at least 150 minutes of moderate-intensity exercise (any activity that increases your heart rate and causes you to sweat) each week. In addition, most adults need muscle-strengthening exercises on 2 or more days a week.   Maintain a healthy weight. The body mass index (BMI) is a screening tool to identify possible weight problems. It provides an estimate of body fat based on height and weight. Your health care provider can find your BMI and can help you achieve or maintain a healthy weight. For males 20 years and older:  A BMI below 18.5 is considered underweight.  A BMI of 18.5 to 24.9 is normal.  A BMI of 25 to 29.9 is considered overweight.  A BMI of 30 and above is considered obese.  Maintain normal blood lipids and cholesterol by exercising and minimizing your intake of saturated fat. Eat a balanced diet with plenty of fruits and vegetables. Blood tests for lipids and cholesterol should begin at age 64 and be repeated every 5 years. If your lipid or cholesterol levels are high, you are over age 85, or you are at high risk for heart disease, you may need your cholesterol levels checked more frequently.Ongoing high lipid and cholesterol levels should be treated with medicines if diet and exercise are not  working.  If you smoke, find out from your health care provider how to quit. If you do not use tobacco, do not start.  Lung cancer screening is recommended for adults aged 30-80 years who are at high risk for developing lung cancer because of a history of smoking. A yearly low-dose CT scan of the lungs is recommended for people who have at least a 30-pack-year history of smoking and are current smokers or have quit within the past 15 years. A pack year of smoking is smoking an average of 1 pack of cigarettes a day for 1 year (for example, a 30-pack-year history of smoking could mean smoking 1 pack a day for 30 years or 2 packs a day for 15 years). Yearly screening should continue until the smoker has stopped smoking for at least 15 years. Yearly screening should be stopped for people who develop a health problem that would prevent them from having lung cancer treatment.  If you choose to drink alcohol, do not have more than 2 drinks per day. One drink is considered to be 12 oz (360 mL) of beer, 5 oz (150 mL) of wine, or 1.5 oz (45 mL) of liquor.  Avoid the use of street drugs. Do not share needles with anyone. Ask for help if you need support or instructions about  stopping the use of drugs.  High blood pressure causes heart disease and increases the risk of stroke. High blood pressure is more likely to develop in:  People who have blood pressure in the end of the normal range (100-139/85-89 mm Hg).  People who are overweight or obese.  People who are African American.  If you are 33-45 years of age, have your blood pressure checked every 3-5 years. If you are 48 years of age or older, have your blood pressure checked every year. You should have your blood pressure measured twice--once when you are at a hospital or clinic, and once when you are not at a hospital or clinic. Record the average of the two measurements. To check your blood pressure when you are not at a hospital or clinic, you can  use:  An automated blood pressure machine at a pharmacy.  A home blood pressure monitor.  If you are 51-2 years old, ask your health care provider if you should take aspirin to prevent heart disease.  Diabetes screening involves taking a blood sample to check your fasting blood sugar level. This should be done once every 3 years after age 52 if you are at a normal weight and without risk factors for diabetes. Testing should be considered at a younger age or be carried out more frequently if you are overweight and have at least 1 risk factor for diabetes.  Colorectal cancer can be detected and often prevented. Most routine colorectal cancer screening begins at the age of 35 and continues through age 82. However, your health care provider may recommend screening at an earlier age if you have risk factors for colon cancer. On a yearly basis, your health care provider may provide home test kits to check for hidden blood in the stool. A small camera at the end of a tube may be used to directly examine the colon (sigmoidoscopy or colonoscopy) to detect the earliest forms of colorectal cancer. Talk to your health care provider about this at age 63 when routine screening begins. A direct exam of the colon should be repeated every 5-10 years through age 74, unless early forms of precancerous polyps or small growths are found.  People who are at an increased risk for hepatitis B should be screened for this virus. You are considered at high risk for hepatitis B if:  You were born in a country where hepatitis B occurs often. Talk with your health care provider about which countries are considered high risk.  Your parents were born in a high-risk country and you have not received a shot to protect against hepatitis B (hepatitis B vaccine).  You have HIV or AIDS.  You use needles to inject street drugs.  You live with, or have sex with, someone who has hepatitis B.  You are a man who has sex with other  men (MSM).  You get hemodialysis treatment.  You take certain medicines for conditions like cancer, organ transplantation, and autoimmune conditions.  Hepatitis C blood testing is recommended for all people born from [birth year] through 1963 and any individual with known risk factors for hepatitis C.  Healthy men should no longer receive prostate-specific antigen (PSA) blood tests as part of routine cancer screening. Talk to your health care provider about prostate cancer screening.  Testicular cancer screening is not recommended for adolescents or adult males who have no symptoms. Screening includes self-exam, a health care provider exam, and other screening tests. Consult with your health care provider about  any symptoms you have or any concerns you have about testicular cancer.  Practice safe sex. Use condoms and avoid high-risk sexual practices to reduce the spread of sexually transmitted infections (STIs).  You should be screened for STIs, including gonorrhea and chlamydia if:  You are sexually active and are younger than 24 years.  You are older than 24 years, and your health care provider tells you that you are at risk for this type of infection.  Your sexual activity has changed since you were last screened, and you are at an increased risk for chlamydia or gonorrhea. Ask your health care provider if you are at risk.  If you are at risk of being infected with HIV, it is recommended that you take a prescription medicine daily to prevent HIV infection. This is called pre-exposure prophylaxis (PrEP). You are considered at risk if:  You are a man who has sex with other men (MSM).  You are a heterosexual man who is sexually active with multiple partners.  You take drugs by injection.  You are sexually active with a partner who has HIV.  Talk with your health care provider about whether you are at high risk of being infected with HIV. If you choose to begin PrEP, you should first be tested  for HIV. You should then be tested every 3 months for as long as you are taking PrEP.  Use sunscreen. Apply sunscreen liberally and repeatedly throughout the day. You should seek shade when your shadow is shorter than you. Protect yourself by wearing long sleeves, pants, a wide-brimmed hat, and sunglasses year round whenever you are outdoors.  Tell your health care provider of new moles or changes in moles, especially if there is a change in shape or color. Also, tell your health care provider if a mole is larger than the size of a pencil eraser.  A one-time screening for abdominal aortic aneurysm (AAA) and surgical repair of large AAAs by ultrasound is recommended for men aged 80-75 years who are current or former smokers.  Stay current with your vaccines (immunizations).   This information is not intended to replace advice given to you by your health care provider. Make sure you discuss any questions you have with your health care provider.   Document Released: 06/02/2008 Document Revised: 12/26/2014 Document Reviewed: 05/02/2011 Elsevier Interactive Patient Education 2016 [place] in the Home  Falls can cause injuries. They can happen to people of all ages. There are many things you can do to make your home safe and to help prevent falls.  WHAT CAN I DO ON THE OUTSIDE OF MY HOME?  Regularly fix the edges of walkways and driveways and fix any cracks.  Remove anything that might make you trip as you walk through a door, such as a raised step or threshold.  Trim any bushes or trees on the path to your home.  Use bright outdoor lighting.  Clear any walking paths of anything that might make someone trip, such as rocks or tools.  Regularly check to see if handrails are loose or broken. Make sure that both sides of any steps have handrails.  Any raised decks and porches should have guardrails on the edges.  Have any leaves, snow, or ice cleared regularly.  Use  sand or salt on walking paths during winter.  Clean up any spills in your garage right away. This includes oil or grease spills. WHAT CAN I DO IN THE BATHROOM?   Use  night lights.  Install grab bars by the toilet and in the tub and shower. Do not use towel bars as grab bars.  Use non-skid mats or decals in the tub or shower.  If you need to sit down in the shower, use a plastic, non-slip stool.  Keep the floor dry. Clean up any water that spills on the floor as soon as it happens.  Remove soap buildup in the tub or shower regularly.  Attach bath mats securely with double-sided non-slip rug tape.  Do not have throw rugs and other things on the floor that can make you trip. WHAT CAN I DO IN THE BEDROOM?  Use night lights.  Make sure that you have a light by your bed that is easy to reach.  Do not use any sheets or blankets that are too big for your bed. They should not hang down onto the floor.  Have a firm chair that has side arms. You can use this for support while you get dressed.  Do not have throw rugs and other things on the floor that can make you trip. WHAT CAN I DO IN THE KITCHEN?  Clean up any spills right away.  Avoid walking on wet floors.  Keep items that you use a lot in easy-to-reach places.  If you need to reach something above you, use a strong step stool that has a grab bar.  Keep electrical cords out of the way.  Do not use floor polish or wax that makes floors slippery. If you must use wax, use non-skid floor wax.  Do not have throw rugs and other things on the floor that can make you trip. WHAT CAN I DO WITH MY STAIRS?  Do not leave any items on the stairs.  Make sure that there are handrails on both sides of the stairs and use them. Fix handrails that are broken or loose. Make sure that handrails are as long as the stairways.  Check any carpeting to make sure that it is firmly attached to the stairs. Fix any carpet that is loose or  worn.  Avoid having throw rugs at the top or bottom of the stairs. If you do have throw rugs, attach them to the floor with carpet tape.  Make sure that you have a light switch at the top of the stairs and the bottom of the stairs. If you do not have them, ask someone to add them for you. WHAT ELSE CAN I DO TO HELP PREVENT FALLS?  Wear shoes that:  Do not have high heels.  Have rubber bottoms.  Are comfortable and fit you well.  Are closed at the toe. Do not wear sandals.  If you use a stepladder:  Make sure that it is fully opened. Do not climb a closed stepladder.  Make sure that both sides of the stepladder are locked into place.  Ask someone to hold it for you, if possible.  Clearly mark and make sure that you can see:  Any grab bars or handrails.  First and last steps.  Where the edge of each step is.  Use tools that help you move around (mobility aids) if they are needed. These include:  Canes.  Walkers.  Scooters.  Crutches.  Turn on the lights when you go into a dark area. Replace any light bulbs as soon as they burn out.  Set up your furniture so you have a clear path. Avoid moving your furniture around.  If any of your  floors are uneven, fix them.  If there are any pets around you, be aware of where they are.  Review your medicines with your doctor. Some medicines can make you feel dizzy. This can increase your chance of falling. Ask your doctor what other things that you can do to help prevent falls.   This information is not intended to replace advice given to you by your health care provider. Make sure you discuss any questions you have with your health care provider.   Document Released: 10/01/2009 Document Revised: 04/21/2015 Document Reviewed: 01/09/2015 Elsevier Interactive Patient Education 2016 [place] Maintenance, Male A healthy lifestyle and preventative care can promote health and wellness.  Maintain regular health,  dental, and eye exams.  Eat a healthy diet. Foods like vegetables, fruits, whole grains, low-fat dairy products, and lean protein foods contain the nutrients you need and are low in calories. Decrease your intake of foods high in solid fats, added sugars, and salt. Get information about a proper diet from your health care provider, if necessary.  Regular physical exercise is one of the most important things you can do for your health. Most adults should get at least 150 minutes of moderate-intensity exercise (any activity that increases your heart rate and causes you to sweat) each week. In addition, most adults need muscle-strengthening exercises on 2 or more days a week.   Maintain a healthy weight. The body mass index (BMI) is a screening tool to identify possible weight problems. It provides an estimate of body fat based on height and weight. Your health care provider can find your BMI and can help you achieve or maintain a healthy weight. For males 20 years and older:  A BMI below 18.5 is considered underweight.  A BMI of 18.5 to 24.9 is normal.  A BMI of 25 to 29.9 is considered overweight.  A BMI of 30 and above is considered obese.  Maintain normal blood lipids and cholesterol by exercising and minimizing your intake of saturated fat. Eat a balanced diet with plenty of fruits and vegetables. Blood tests for lipids and cholesterol should begin at age 39 and be repeated every 5 years. If your lipid or cholesterol levels are high, you are over age 55, or you are at high risk for heart disease, you may need your cholesterol levels checked more frequently.Ongoing high lipid and cholesterol levels should be treated with medicines if diet and exercise are not working.  If you smoke, find out from your health care provider how to quit. If you do not use tobacco, do not start.  Lung cancer screening is recommended for adults aged 75-80 years who are at high risk for developing lung cancer  because of a history of smoking. A yearly low-dose CT scan of the lungs is recommended for people who have at least a 30-pack-year history of smoking and are current smokers or have quit within the past 15 years. A pack year of smoking is smoking an average of 1 pack of cigarettes a day for 1 year (for example, a 30-pack-year history of smoking could mean smoking 1 pack a day for 30 years or 2 packs a day for 15 years). Yearly screening should continue until the smoker has stopped smoking for at least 15 years. Yearly screening should be stopped for people who develop a health problem that would prevent them from having lung cancer treatment.  If you choose to drink alcohol, do not have more than 2 drinks per day.  One drink is considered to be 12 oz (360 mL) of beer, 5 oz (150 mL) of wine, or 1.5 oz (45 mL) of liquor.  Avoid the use of street drugs. Do not share needles with anyone. Ask for help if you need support or instructions about stopping the use of drugs.  High blood pressure causes heart disease and increases the risk of stroke. High blood pressure is more likely to develop in:  People who have blood pressure in the end of the normal range (100-139/85-89 mm Hg).  People who are overweight or obese.  People who are African American.  If you are 23-21 years of age, have your blood pressure checked every 3-5 years. If you are 23 years of age or older, have your blood pressure checked every year. You should have your blood pressure measured twice--once when you are at a hospital or clinic, and once when you are not at a hospital or clinic. Record the average of the two measurements. To check your blood pressure when you are not at a hospital or clinic, you can use:  An automated blood pressure machine at a pharmacy.  A home blood pressure monitor.  If you are 9-74 years old, ask your health care provider if you should take aspirin to prevent heart disease.  Diabetes screening involves  taking a blood sample to check your fasting blood sugar level. This should be done once every 3 years after age 67 if you are at a normal weight and without risk factors for diabetes. Testing should be considered at a younger age or be carried out more frequently if you are overweight and have at least 1 risk factor for diabetes.  Colorectal cancer can be detected and often prevented. Most routine colorectal cancer screening begins at the age of 27 and continues through age 64. However, your health care provider may recommend screening at an earlier age if you have risk factors for colon cancer. On a yearly basis, your health care provider may provide home test kits to check for hidden blood in the stool. A small camera at the end of a tube may be used to directly examine the colon (sigmoidoscopy or colonoscopy) to detect the earliest forms of colorectal cancer. Talk to your health care provider about this at age 15 when routine screening begins. A direct exam of the colon should be repeated every 5-10 years through age 83, unless early forms of precancerous polyps or small growths are found.  People who are at an increased risk for hepatitis B should be screened for this virus. You are considered at high risk for hepatitis B if:  You were born in a country where hepatitis B occurs often. Talk with your health care provider about which countries are considered high risk.  Your parents were born in a high-risk country and you have not received a shot to protect against hepatitis B (hepatitis B vaccine).  You have HIV or AIDS.  You use needles to inject street drugs.  You live with, or have sex with, someone who has hepatitis B.  You are a man who has sex with other men (MSM).  You get hemodialysis treatment.  You take certain medicines for conditions like cancer, organ transplantation, and autoimmune conditions.  Hepatitis C blood testing is recommended for all people born from [birth year] through 1963  and any individual with known risk factors for hepatitis C.  Healthy men should no longer receive prostate-specific antigen (PSA) blood tests as part of  routine cancer screening. Talk to your health care provider about prostate cancer screening.  Testicular cancer screening is not recommended for adolescents or adult males who have no symptoms. Screening includes self-exam, a health care provider exam, and other screening tests. Consult with your health care provider about any symptoms you have or any concerns you have about testicular cancer.  Practice safe sex. Use condoms and avoid high-risk sexual practices to reduce the spread of sexually transmitted infections (STIs).  You should be screened for STIs, including gonorrhea and chlamydia if:  You are sexually active and are younger than 24 years.  You are older than 24 years, and your health care provider tells you that you are at risk for this type of infection.  Your sexual activity has changed since you were last screened, and you are at an increased risk for chlamydia or gonorrhea. Ask your health care provider if you are at risk.  If you are at risk of being infected with HIV, it is recommended that you take a prescription medicine daily to prevent HIV infection. This is called pre-exposure prophylaxis (PrEP). You are considered at risk if:  You are a man who has sex with other men (MSM).  You are a heterosexual man who is sexually active with multiple partners.  You take drugs by injection.  You are sexually active with a partner who has HIV.  Talk with your health care provider about whether you are at high risk of being infected with HIV. If you choose to begin PrEP, you should first be tested for HIV. You should then be tested every 3 months for as long as you are taking PrEP.  Use sunscreen. Apply sunscreen liberally and repeatedly throughout the day. You should seek shade when your shadow is shorter than you. Protect  yourself by wearing long sleeves, pants, a wide-brimmed hat, and sunglasses year round whenever you are outdoors.  Tell your health care provider of new moles or changes in moles, especially if there is a change in shape or color. Also, tell your health care provider if a mole is larger than the size of a pencil eraser.  A one-time screening for abdominal aortic aneurysm (AAA) and surgical repair of large AAAs by ultrasound is recommended for men aged 36-75 years who are current or former smokers.  Stay current with your vaccines (immunizations).   This information is not intended to replace advice given to you by your health care provider. Make sure you discuss any questions you have with your health care provider.   Document Released: 06/02/2008 Document Revised: 12/26/2014 Document Reviewed: 05/02/2011 Elsevier Interactive Patient Education [employer].

## 2016-06-17 NOTE — Progress Notes (Signed)
Subjective:   Charles Castillo. is a 82 y.o. male who presents for Medicare Annual/Subsequent preventive examination.  Review of Systems:   HRA assessment completed during this visit with Charles Castillo  The Patient was informed that the wellness visit is to identify  future health risk and to educate and initiate measures that can reduce risk for increased disease through the lifespan.    NO ROS; Medicare Wellness Visit Last OV:  05/24/2016 Labs completed: 05/2016 (LDL 100; HDL 49 in 2015) A1c trending 6.4 range and down to 6.1 (glucose if fasting elevated)   Describes health as good; father died of CHF; 85; mother no hx info.  Feels health is good:  BP is in good control; chol is under control Cancer free; cancer uretha; local surgery [facility] for treatment of urethral cancer; 06/2015 Void normally now; out of different place;  CRF; monitoring and is aware of meds; caution regarding dyes etc.  OSA; long term use of CPAP    Lifestyle review and risk:  Goal lose 4 weight DM (pre-diabetes addressed)  Hyperlipidemia; medically managed Ckd; tracking   Psychosocial: lives w spouse;  Enjoys Science writer;  7 children; 19 grand 9 great grand One dtr here in [place]; one [place], [place] and [place]   July 6th family reunion in [place];   Tobacco: quit 47 years ago   How many drinks do you have per week? Does not drink at all   Medications; Cialis 20mg  instead of 10  BMI: 39  Diet;  no diet specification Breakfast; eggs and meat; if he eats breakfast  Lunch; sandwich; cheese sandwich; tuna; chicken salad Supper; meat and 2 vegetables; has tried to slow down wife from 2 vegetables to one and not fixing so much Recommended green vegetables;  Deserts; doesn't eat them often    Teeth or Denture issues? Annually;   Exercise;   Walking; 2 to 3 times a week; one mile They live in townhome complex; he gets exercise when he goes with wife shopping;  Grocery shop together; [employer]     Sport activity hx football and basketball After college; played tennis; gave this up 58 to 82 yo To come down from stress; talks to spouse to relax    [name]; townhome is 2 levels; bedroom on ground floor No problems with stairs  Fall hx; no  Fall risk: Fear of falling? No  Gait: getting up and down;  Given education on "Fall Prevention in the Home" for more safety tips the patient can apply as appropriate.  Long term goal is to "age in place" or undecided/    Safety features reviewed for safe community; firearms if in the home; smoke alarms; sun protection when outside; driving difficulties or accidents  Mental Health:  Any emotional problems? Anxious, depressed, irritable, sad or blue? no Denies feeling depressed or hopeless; voices pleasure in daily life How many social activities have you been engaged in within the last 2 weeks? None Who would help you with chores; illness; shopping other? Plenty of support if needed   Pain: no pain   Cognitive; recall; no issues  Manages checkbook, medications; no failures of task Ad8 score reviewed for issues;  Issues making decisions; no  Less interest in hobbies / activities" no  Repeats questions, stories; family complaining: NO  Trouble using ordinary gadgets; microwave; computer: no  Forgets the month or year: no  Mismanaging finances: no  Missing apt: no but does write them down  Daily  problems with thinking of memory NO Ad8 score is 0   Any dizziness when standing up? no  Mobilization and Functional losses from last year to this year? no  Sleep pattern changes; get up x 2; 8  hours   Urinary or fecal incontinence reviewed: none   Advanced Directive addressed; Completed; requested copy for chart   ASSESSMENT:  Counseling Health Maintenance Gaps:  Colonoscopy; 12/2008- aged out  EKG: 11/2015  Hearing: 2000hz  both ears   Ophthalmology exam: diabetic eye exam;  no retinopathy/  Charles Castillo at [facility];  just had one completed June 22nd   Immunizations Due: up to date   Individual Goal: to continue to cut back on protions  Health Recommendations and Referrals Weight loss and think about exercising; doing something fun Educated regarding A1c and pre-diabetes and can lose 10 lbs for loss of 1" in waist Or can continue his walk but speed up to increase heart rate as tolerated  Educated on the loss of muscle after 65 and if not exercising; increased risk of loss of balance and falls as he ages.   Barriers to Success Motivation is not high; but willing to attempt weight loss  Does not use exercise to de-stress and enjoys relaxing in retirement;    Current Care Team reviewed and updated Dr. Yong Channel Dr. Manuella Castillo for eyes  [facility] follows q 6 months in [place]    Education provided and lifestyle risk discussed   All Health Maintenance Gaps Reviewed for closure    Cardiac Risk Factors include: advanced age (>92men, >66 women);dyslipidemia;family history of premature cardiovascular disease;hypertension;male gender;microalbuminuria;obesity (BMI >30kg/m2)     Objective:    Vitals: BP 120/68 mmHg  Pulse 76  Ht 6\' 2"  (1.88 m)  Wt 272 lb 7 oz (123.577 kg)  BMI 34.96 kg/m2  SpO2 96%  Body mass index is 34.96 kg/(m^2).  Tobacco History  Smoking status  . Former Smoker -- 1.00 packs/day for 25 years  . Types: Cigarettes  . Quit date: 12/19/1968  Smokeless tobacco  . Never Used     Counseling given: Yes   Past Medical History  Diagnosis Date  . Hypertension   . OSA on CPAP     mild to moderate per study 04-19-2011  . Hyperlipidemia   . BPH (benign prostatic hypertrophy) with urinary obstruction   . History of urinary tract part removal     2008--  partial urethrectomy for SCC  . Organic impotence   . CKD (chronic kidney disease), stage III   . Penile lesion   . History of cellulitis     2012-- left lower extremitiy  . Diet-controlled type 2 diabetes mellitus ([place])   . Arthritis     Past Surgical History  Procedure Laterality Date  . Urethrectomy  05-01-2007    Partial  (squamous cell carcinoma )  . Cysto/  bilateral retrograde pyelogram/  resection prostatic urethral tumor/  excision biopsy urethral meatus and meatotomy/  transrectal ultrasound prostate biopsy's  12-05-2006  . Penile biopsy N/A 06/18/2015    Procedure: PENILE BIOPSY;  Surgeon: Irine Seal, MD;  Location: [facility];  Service: Urology;  Laterality: N/A;  . Cystoscopy N/A 06/18/2015    Procedure: CYSTOSCOPY FLEXIBLE;  Surgeon: Irine Seal, MD;  Location[facility];  Service: Urology;  Laterality: N/A;   Family History  Problem Relation Age of Onset  . Heart failure Father     CHF, no history MI   History  Sexual Activity  .  Sexual Activity: Not on file    Outpatient Encounter Prescriptions as of 06/17/2016  Medication Sig  . amLODipine (NORVASC) 5 MG tablet TAKE 1 TABLET EVERY DAY  . aspirin EC 81 MG tablet Take 81 mg by mouth daily.  Marland Kitchen atorvastatin (LIPITOR) 40 MG tablet Take 1 tablet (40 mg total) by mouth daily.  . DiphenhydrAMINE HCl, Sleep, (SOMINEX PO) Take 1 tablet by mouth at bedtime.   . tadalafil (CIALIS) 10 MG tablet Take 20 mg by mouth daily as needed for erectile dysfunction.    No facility-administered encounter medications on file as of 06/17/2016.    Activities of Daily Living In your present state of health, do you have any difficulty performing the following activities: 06/17/2016 06/18/2015  Hearing? N N  Vision? N N  Difficulty concentrating or making decisions? N N  Walking or climbing stairs? N N  Dressing or bathing? N N  Doing errands, shopping? N -  Preparing Food and eating ? N -  Using the Toilet? N -  In the past six months, have you accidently leaked urine? N -  Do you have problems with loss of bowel control? N -  Managing your Medications? N -  Managing your Finances? N -  Housekeeping or managing your Housekeeping? N -     Patient Care Team: Marin Olp, MD as PCP - General (Family Medicine)   Assessment:      Exercise Activities and Dietary recommendations Current Exercise Habits: Home exercise routine, Type of exercise: walking, Time (Minutes): 30, Frequency (Times/Week): 2, Weekly Exercise (Minutes/Week): 60, Intensity: Moderate  Goals    . Weight < 260 lb (117.935 kg)     Cut back on portions;   Controllable  risk for heart disease reviewed for goal setting: Includes; BP control <140/80; monitoring renal disease; obesity (BMI >30); sedentary lifestyle Educated regarding Metabolic syndrome / Excess weight around the waist;  Waist circumference for Men >40  Triglycerides > 150 (60) HDL < 50 (49.9) BP > 130/85  Glucose > 100 Plan Lifestyle changes  Heart Healthy Diet; less sugar  Mediterranean Diet: eating primarily plant-based food such as fruits and vegetables, whole grains, legumes and nuts; replacing butter with healthy fats such as olive oil and canola oil Using herbs and spices instead of salt to flavor food Limiting red meat to no more than a few times a month Eating fish and poultry at least 2 times a week Getting plenty of exercise        Fall Risk Fall Risk  06/17/2016 06/17/2016 05/24/2016 03/19/2015 01/03/2014  Falls in the past year? No No No No No   Depression Screen PHQ 2/9 Scores 06/17/2016 06/17/2016 05/24/2016 03/19/2015  PHQ - 2 Score 0 0 0 0    Cognitive Testing MMSE - Mini Mental State Exam 06/17/2016  Not completed: (No Data)    Immunization History  Administered Date(s) Administered  . Influenza Split 09/20/2012  . Influenza Whole 12/19/2005, 09/30/2009, 09/10/2010  . Influenza,inj,Quad PF,36+ Mos 09/15/2014  . Influenza,inj,Quad PF,6-35 Mos 09/18/2013  . Influenza-Unspecified 09/25/2015  . Pneumococcal Conjugate-13 03/19/2015  . Pneumococcal Polysaccharide-23 09/18/2004  . Td 11/01/2010   Screening Tests Health Maintenance  Topic Date Due  .  OPHTHALMOLOGY EXAM  10/21/2015  . INFLUENZA VACCINE  07/19/2016  . FOOT EXAM  11/17/2016  . HEMOGLOBIN A1C  11/23/2016  . URINE MICROALBUMIN  05/24/2017  . TETANUS/TDAP  11/01/2020  . PNA vac Low Risk Adult  Completed      Plan:  Last eye exam 06/09/16 and will confirm  Will try to lose a few pounds   During the course of the visit the patient was educated and counseled about the following appropriate screening and preventive services:   Vaccines to include Pneumoccal, Influenza, Hepatitis B, Td, Zostavax, HCV/ up to date  Electrocardiogram  Cardiovascular Disease; medically managed;   Colorectal cancer screening- aged out  Diabetes screening; educated pre diabetes; lose 1" from waste; (10lbs) or Increase walking to brisk walking 3 days a week   Prostate Cancer Screening/ neg; followed [facility] urethral cancer currently doing well   Glaucoma screening/ neg  Nutrition counseling / eats well; not a lot of sweets; will work on portion control   Smoking cessation counseling/ n/a quit > 40 years ago   Patient Instructions (the written plan) was given to the patient.    Call to [facility] [phone] Confirmed he was seen 06/09/2016 and will fax over eye report     Jahking Lesser, RN  06/17/2016  I have reviewed and agree with note, evaluation, plan. Thankful for obtaining eye exam report.   Garret Reddish, MD

## 2016-06-17 NOTE — Telephone Encounter (Signed)
Dr. Yong Channel,  This patient states he is on 20mg  cialis but takes it infrequently; It looks like 20mg  was ordered up to April 2017;  Please advise; he has not refilled; Stated he told someone here he was on 10 without checking the bottle. The medication record states 10mg / Tks,  Pollyann Kennedy

## 2016-09-22 DIAGNOSIS — Z23 Encounter for immunization: Secondary | ICD-10-CM | POA: Diagnosis not present

## 2016-09-28 DIAGNOSIS — C680 Malignant neoplasm of urethra: Secondary | ICD-10-CM | POA: Diagnosis not present

## 2016-10-13 DIAGNOSIS — Z8559 Personal history of malignant neoplasm of other urinary tract organ: Secondary | ICD-10-CM | POA: Diagnosis not present

## 2016-10-13 DIAGNOSIS — Z8549 Personal history of malignant neoplasm of other male genital organs: Secondary | ICD-10-CM | POA: Diagnosis not present

## 2016-10-13 DIAGNOSIS — T8385XA Stenosis of genitourinary prosthetic devices, implants and grafts, initial encounter: Secondary | ICD-10-CM | POA: Diagnosis not present

## 2016-10-13 DIAGNOSIS — Z7982 Long term (current) use of aspirin: Secondary | ICD-10-CM | POA: Diagnosis not present

## 2016-10-13 DIAGNOSIS — Z6834 Body mass index (BMI) 34.0-34.9, adult: Secondary | ICD-10-CM | POA: Diagnosis not present

## 2016-10-13 DIAGNOSIS — E7800 Pure hypercholesterolemia, unspecified: Secondary | ICD-10-CM | POA: Diagnosis not present

## 2016-10-13 DIAGNOSIS — Z79899 Other long term (current) drug therapy: Secondary | ICD-10-CM | POA: Diagnosis not present

## 2016-10-13 DIAGNOSIS — Z87891 Personal history of nicotine dependence: Secondary | ICD-10-CM | POA: Diagnosis not present

## 2016-10-13 DIAGNOSIS — N99534 Stenosis of continent stoma of urinary tract: Secondary | ICD-10-CM | POA: Diagnosis not present

## 2016-10-13 DIAGNOSIS — E669 Obesity, unspecified: Secondary | ICD-10-CM | POA: Diagnosis not present

## 2016-10-13 DIAGNOSIS — N289 Disorder of kidney and ureter, unspecified: Secondary | ICD-10-CM | POA: Diagnosis not present

## 2016-10-13 DIAGNOSIS — N400 Enlarged prostate without lower urinary tract symptoms: Secondary | ICD-10-CM | POA: Diagnosis not present

## 2016-10-13 HISTORY — PX: CYSTOSCOPY WITH URETHRAL DILATATION: SHX5125

## 2016-10-21 ENCOUNTER — Ambulatory Visit: Payer: Medicare Other | Admitting: Family Medicine

## 2016-10-23 ENCOUNTER — Other Ambulatory Visit: Payer: Self-pay | Admitting: Family Medicine

## 2016-10-28 DIAGNOSIS — C680 Malignant neoplasm of urethra: Secondary | ICD-10-CM | POA: Diagnosis not present

## 2016-11-23 ENCOUNTER — Ambulatory Visit: Payer: Medicare Other | Admitting: Family Medicine

## 2016-11-25 ENCOUNTER — Ambulatory Visit: Payer: Medicare Other | Admitting: Family Medicine

## 2016-11-28 ENCOUNTER — Ambulatory Visit (INDEPENDENT_AMBULATORY_CARE_PROVIDER_SITE_OTHER): Payer: Medicare Other | Admitting: Family Medicine

## 2016-11-28 ENCOUNTER — Encounter: Payer: Self-pay | Admitting: Family Medicine

## 2016-11-28 VITALS — BP 118/60 | HR 77 | Temp 98.0°F | Wt 282.2 lb

## 2016-11-28 DIAGNOSIS — N183 Chronic kidney disease, stage 3 unspecified: Secondary | ICD-10-CM

## 2016-11-28 DIAGNOSIS — E119 Type 2 diabetes mellitus without complications: Secondary | ICD-10-CM | POA: Diagnosis not present

## 2016-11-28 DIAGNOSIS — I10 Essential (primary) hypertension: Secondary | ICD-10-CM | POA: Diagnosis not present

## 2016-11-28 DIAGNOSIS — E785 Hyperlipidemia, unspecified: Secondary | ICD-10-CM

## 2016-11-28 DIAGNOSIS — M1A9XX0 Chronic gout, unspecified, without tophus (tophi): Secondary | ICD-10-CM

## 2016-11-28 LAB — BASIC METABOLIC PANEL
BUN: 28 mg/dL — AB (ref 6–23)
CALCIUM: 9.7 mg/dL (ref 8.4–10.5)
CO2: 22 mEq/L (ref 19–32)
CREATININE: 1.69 mg/dL — AB (ref 0.40–1.50)
Chloride: 107 mEq/L (ref 96–112)
GFR: 41.69 mL/min — AB (ref >60.00)
GLUCOSE: 151 mg/dL — AB (ref 70–99)
Potassium: 4.2 mEq/L (ref 3.5–5.1)
SODIUM: 140 meq/L (ref 135–145)

## 2016-11-28 LAB — HEMOGLOBIN A1C: Hgb A1c MFr Bld: 6.3 % (ref 4.6–6.5)

## 2016-11-28 LAB — LDL CHOLESTEROL, DIRECT: Direct LDL: 105 mg/dL

## 2016-11-28 NOTE — Assessment & Plan Note (Signed)
S: no gout flares since last visit  (6 months) Lab Results  Component Value Date   LABURIC 8.2 (H) 05/24/2016  A/P: discussed option of treating he would like to keep list shorter- we discussed possible kidney implications

## 2016-11-28 NOTE — Assessment & Plan Note (Signed)
Hypertension S: BP controlled on amlodipine 5mg . Ace-I planned if gfr worsens or microalbumin/cr ratio elevated. GFR stable in 30-40 range. Avoids nsaids BP Readings from Last 3 Encounters:  11/28/16 118/60  06/17/16 120/68  05/24/16 110/64  A/P:Continue current medicatoins for BP. Also continue to control BP and lipids and avoid nsaids. Hopeful to avoid progression

## 2016-11-28 NOTE — Progress Notes (Signed)
Subjective:  Charles Castillo. is a 82 y.o. year old very pleasant male patient who presents for/with See problem oriented charting ROS- No chest pain or shortness of breath. No headache or blurry vision. No edema. No confusion   Past Medical History-  Patient Active Problem List   Diagnosis Date Noted  . Diabetes mellitus ([place]) 09/15/2014    Priority: High  . Erectile dysfunction 09/15/2014    Priority: Medium  . Urethral Cancer s/p excision.  11/04/2009    Priority: Medium  . CKD (chronic kidney disease), stage III 03/24/2008    Priority: Medium  . Hyperlipidemia 08/27/2007    Priority: Medium  . Essential hypertension 07/03/2007    Priority: Medium  . SLEEP APNEA 07/03/2007    Priority: Medium  . Former smoker 09/15/2014    Priority: Low  . Obesity 10/10/2012    Priority: Low  . Osteoarthritis 07/03/2007    Priority: Low  . Gout 05/24/2016    Medications- reviewed and updated Current Outpatient Prescriptions  Medication Sig Dispense Refill  . amLODipine (NORVASC) 5 MG tablet TAKE 1 TABLET EVERY DAY 90 tablet 2  . aspirin EC 81 MG tablet Take 81 mg by mouth daily.    Marland Kitchen atorvastatin (LIPITOR) 40 MG tablet Take 1 tablet (40 mg total) by mouth daily. 90 tablet 3  . DiphenhydrAMINE HCl, Sleep, (SOMINEX PO) Take 1 tablet by mouth at bedtime.       Objective: BP 118/60 (BP Location: Left Arm, Patient Position: Sitting, Cuff Size: Large)   Pulse 77   Temp 98 F (36.7 C) (Oral)   Wt 282 lb 3.2 oz (128 kg)   SpO2 96%   BMI 36.23 kg/m  Gen: NAD, resting comfortably CV: RRR no murmurs rubs or gallops Lungs: CTAB no crackles, wheeze, rhonchi Abdomen: soft/nontender/nondistended/normal bowel sounds. No rebound or guarding.  obese Ext: no edema Skin: warm, dry  Assessment/Plan:  Diabetes mellitus ([place]) S: well controlled. On no medication CBGs- does not echeck Exercise and diet-  weight up 10 lbs- during time of cancer treatment. Walking 1/2 a mile or mile 2-3 days a  week.  Lab Results  Component Value Date   HGBA1C 6.1 05/24/2016   HGBA1C 6.4 11/18/2015   HGBA1C 6.4 03/19/2015   A/P: discussed only change would be working off the extra 10 lbs though some of this could have been heavier winter clothes  CKD (chronic kidney disease), stage III Hypertension S: BP controlled on amlodipine 5mg . Ace-I planned if gfr worsens or microalbumin/cr ratio elevated. GFR stable in 30-40 range. Avoids nsaids BP Readings from Last 3 Encounters:  11/28/16 118/60  06/17/16 120/68  05/24/16 110/64  A/P:Continue current medicatoins for BP. Also continue to control BP and lipids and avoid nsaids. Hopeful to avoid progression  Hyperlipidemia S: reasonably controlled on atorvastatin 40mg  with last LDL at 100. No myalgias.  Lab Results  Component Value Date   CHOL 181 05/05/2014   HDL 49.90 05/05/2014   LDLCALC 119 (H) 05/05/2014   LDLDIRECT 100.0 11/18/2015   TRIG 60.0 05/05/2014   CHOLHDL 4 05/05/2014   A/P: update LDL, push for weight loss   Gout S: no gout flares since last visit  (6 months) Lab Results  Component Value Date   LABURIC 8.2 (H) 05/24/2016  A/P: discussed option of treating he would like to keep list shorter- we discussed possible kidney implications  6 months.   Orders Placed This Encounter  Procedures  . Hemoglobin A1c    [place]  .  Basic metabolic panel    [place]  . LDL cholesterol, direct    [place]   Return precautions advised.  Garret Reddish, MD

## 2016-11-28 NOTE — Assessment & Plan Note (Signed)
S: reasonably controlled on atorvastatin 40mg  with last LDL at 100. No myalgias.  Lab Results  Component Value Date   CHOL 181 05/05/2014   HDL 49.90 05/05/2014   LDLCALC 119 (H) 05/05/2014   LDLDIRECT 100.0 11/18/2015   TRIG 60.0 05/05/2014   CHOLHDL 4 05/05/2014   A/P: update LDL, push for weight loss

## 2016-11-28 NOTE — Progress Notes (Signed)
Pre visit review using our clinic review tool, if applicable. No additional management support is needed unless otherwise documented below in the visit note. 

## 2016-11-28 NOTE — Assessment & Plan Note (Signed)
S: well controlled. On no medication CBGs- does not echeck Exercise and diet-  weight up 10 lbs- during time of cancer treatment. Walking 1/2 a mile or mile 2-3 days a week.  Lab Results  Component Value Date   HGBA1C 6.1 05/24/2016   HGBA1C 6.4 11/18/2015   HGBA1C 6.4 03/19/2015   A/P: discussed only change would be working off the extra 10 lbs though some of this could have been heavier winter clothes

## 2016-11-28 NOTE — Patient Instructions (Signed)
Labs before you leave  Follow up in 6 months

## 2016-12-28 ENCOUNTER — Other Ambulatory Visit: Payer: Self-pay

## 2016-12-28 ENCOUNTER — Telehealth: Payer: Self-pay | Admitting: Family Medicine

## 2016-12-28 MED ORDER — ATORVASTATIN CALCIUM 40 MG PO TABS: 40.0000 mg | ORAL_TABLET | Freq: Every day | ORAL | 3 refills | Status: DC

## 2016-12-28 NOTE — Telephone Encounter (Signed)
Pt needs new rx atorvastatin 40  Mg #90 w/3 refills  [facility]

## 2016-12-28 NOTE — Telephone Encounter (Signed)
Prescription sent to pharmacy as requested.

## 2017-01-06 ENCOUNTER — Other Ambulatory Visit: Payer: Self-pay

## 2017-01-06 ENCOUNTER — Telehealth: Payer: Self-pay | Admitting: Family Medicine

## 2017-01-06 MED ORDER — AMLODIPINE BESYLATE 5 MG PO TABS: 5.0000 mg | ORAL_TABLET | Freq: Every day | ORAL | 2 refills | Status: DC

## 2017-01-06 NOTE — Telephone Encounter (Signed)
Prescription sent to pharmacy as requested.

## 2017-01-06 NOTE — Telephone Encounter (Signed)
Pt has changed insurance companies this year and needs new rx amLODipine (NORVASC) 5 MG tablet  [facility]

## 2017-01-17 DIAGNOSIS — D1801 Hemangioma of skin and subcutaneous tissue: Secondary | ICD-10-CM | POA: Diagnosis not present

## 2017-01-17 DIAGNOSIS — L918 Other hypertrophic disorders of the skin: Secondary | ICD-10-CM | POA: Diagnosis not present

## 2017-01-17 DIAGNOSIS — D225 Melanocytic nevi of trunk: Secondary | ICD-10-CM | POA: Diagnosis not present

## 2017-01-17 DIAGNOSIS — L853 Xerosis cutis: Secondary | ICD-10-CM | POA: Diagnosis not present

## 2017-01-17 DIAGNOSIS — L821 Other seborrheic keratosis: Secondary | ICD-10-CM | POA: Diagnosis not present

## 2017-04-19 DIAGNOSIS — R590 Localized enlarged lymph nodes: Secondary | ICD-10-CM | POA: Diagnosis not present

## 2017-04-19 DIAGNOSIS — C680 Malignant neoplasm of urethra: Secondary | ICD-10-CM | POA: Diagnosis not present

## 2017-04-25 DIAGNOSIS — R590 Localized enlarged lymph nodes: Secondary | ICD-10-CM | POA: Diagnosis not present

## 2017-04-25 DIAGNOSIS — C680 Malignant neoplasm of urethra: Secondary | ICD-10-CM | POA: Diagnosis not present

## 2017-04-26 DIAGNOSIS — C680 Malignant neoplasm of urethra: Secondary | ICD-10-CM | POA: Diagnosis not present

## 2017-04-26 DIAGNOSIS — C774 Secondary and unspecified malignant neoplasm of inguinal and lower limb lymph nodes: Secondary | ICD-10-CM | POA: Diagnosis not present

## 2017-04-26 DIAGNOSIS — Z9079 Acquired absence of other genital organ(s): Secondary | ICD-10-CM | POA: Diagnosis not present

## 2017-04-26 DIAGNOSIS — Z87891 Personal history of nicotine dependence: Secondary | ICD-10-CM | POA: Diagnosis not present

## 2017-04-26 DIAGNOSIS — C801 Malignant (primary) neoplasm, unspecified: Secondary | ICD-10-CM | POA: Diagnosis not present

## 2017-05-03 DIAGNOSIS — C680 Malignant neoplasm of urethra: Secondary | ICD-10-CM | POA: Diagnosis not present

## 2017-05-03 DIAGNOSIS — Z7189 Other specified counseling: Secondary | ICD-10-CM | POA: Diagnosis not present

## 2017-05-03 DIAGNOSIS — N182 Chronic kidney disease, stage 2 (mild): Secondary | ICD-10-CM | POA: Diagnosis not present

## 2017-05-03 DIAGNOSIS — R590 Localized enlarged lymph nodes: Secondary | ICD-10-CM | POA: Diagnosis not present

## 2017-05-10 DIAGNOSIS — R590 Localized enlarged lymph nodes: Secondary | ICD-10-CM | POA: Diagnosis not present

## 2017-05-10 DIAGNOSIS — C680 Malignant neoplasm of urethra: Secondary | ICD-10-CM | POA: Diagnosis not present

## 2017-05-10 DIAGNOSIS — N182 Chronic kidney disease, stage 2 (mild): Secondary | ICD-10-CM | POA: Diagnosis not present

## 2017-05-15 ENCOUNTER — Encounter: Payer: Self-pay | Admitting: Family Medicine

## 2017-05-16 DIAGNOSIS — Z5111 Encounter for antineoplastic chemotherapy: Secondary | ICD-10-CM | POA: Diagnosis not present

## 2017-05-16 DIAGNOSIS — Z8559 Personal history of malignant neoplasm of other urinary tract organ: Secondary | ICD-10-CM | POA: Diagnosis not present

## 2017-05-16 DIAGNOSIS — C774 Secondary and unspecified malignant neoplasm of inguinal and lower limb lymph nodes: Secondary | ICD-10-CM | POA: Diagnosis present

## 2017-05-16 DIAGNOSIS — Z6836 Body mass index (BMI) 36.0-36.9, adult: Secondary | ICD-10-CM | POA: Diagnosis not present

## 2017-05-16 DIAGNOSIS — T451X5A Adverse effect of antineoplastic and immunosuppressive drugs, initial encounter: Secondary | ICD-10-CM | POA: Diagnosis not present

## 2017-05-16 DIAGNOSIS — Z87891 Personal history of nicotine dependence: Secondary | ICD-10-CM | POA: Diagnosis not present

## 2017-05-16 DIAGNOSIS — C680 Malignant neoplasm of urethra: Secondary | ICD-10-CM | POA: Diagnosis not present

## 2017-05-16 DIAGNOSIS — E7800 Pure hypercholesterolemia, unspecified: Secondary | ICD-10-CM | POA: Diagnosis present

## 2017-05-16 DIAGNOSIS — Z7982 Long term (current) use of aspirin: Secondary | ICD-10-CM | POA: Diagnosis not present

## 2017-05-16 DIAGNOSIS — I129 Hypertensive chronic kidney disease with stage 1 through stage 4 chronic kidney disease, or unspecified chronic kidney disease: Secondary | ICD-10-CM | POA: Diagnosis present

## 2017-05-16 DIAGNOSIS — R066 Hiccough: Secondary | ICD-10-CM | POA: Diagnosis not present

## 2017-05-16 DIAGNOSIS — R590 Localized enlarged lymph nodes: Secondary | ICD-10-CM | POA: Diagnosis not present

## 2017-05-16 DIAGNOSIS — N189 Chronic kidney disease, unspecified: Secondary | ICD-10-CM | POA: Diagnosis present

## 2017-05-16 DIAGNOSIS — Z79899 Other long term (current) drug therapy: Secondary | ICD-10-CM | POA: Diagnosis not present

## 2017-05-16 DIAGNOSIS — E669 Obesity, unspecified: Secondary | ICD-10-CM | POA: Diagnosis present

## 2017-05-16 DIAGNOSIS — R110 Nausea: Secondary | ICD-10-CM | POA: Diagnosis not present

## 2017-05-20 DIAGNOSIS — R590 Localized enlarged lymph nodes: Secondary | ICD-10-CM | POA: Diagnosis not present

## 2017-05-20 DIAGNOSIS — C680 Malignant neoplasm of urethra: Secondary | ICD-10-CM | POA: Diagnosis not present

## 2017-05-23 ENCOUNTER — Inpatient Hospital Stay (HOSPITAL_COMMUNITY): Payer: Medicare Other

## 2017-05-23 ENCOUNTER — Inpatient Hospital Stay (HOSPITAL_COMMUNITY)
Admission: EM | Admit: 2017-05-23 | Discharge: 2017-05-27 | DRG: 683 | Disposition: A | Discharge status: Home / Self Care | Payer: Medicare Other | Attending: Internal Medicine | Admitting: Internal Medicine

## 2017-05-23 ENCOUNTER — Encounter (HOSPITAL_COMMUNITY): Payer: Self-pay | Admitting: Obstetrics and Gynecology

## 2017-05-23 DIAGNOSIS — E1169 Type 2 diabetes mellitus with other specified complication: Secondary | ICD-10-CM | POA: Diagnosis present

## 2017-05-23 DIAGNOSIS — E785 Hyperlipidemia, unspecified: Secondary | ICD-10-CM | POA: Diagnosis not present

## 2017-05-23 DIAGNOSIS — G4733 Obstructive sleep apnea (adult) (pediatric): Secondary | ICD-10-CM | POA: Diagnosis present

## 2017-05-23 DIAGNOSIS — R945 Abnormal results of liver function studies: Secondary | ICD-10-CM | POA: Diagnosis not present

## 2017-05-23 DIAGNOSIS — D6959 Other secondary thrombocytopenia: Secondary | ICD-10-CM | POA: Diagnosis present

## 2017-05-23 DIAGNOSIS — E876 Hypokalemia: Secondary | ICD-10-CM | POA: Diagnosis not present

## 2017-05-23 DIAGNOSIS — E669 Obesity, unspecified: Secondary | ICD-10-CM | POA: Diagnosis present

## 2017-05-23 DIAGNOSIS — Z7982 Long term (current) use of aspirin: Secondary | ICD-10-CM

## 2017-05-23 DIAGNOSIS — I129 Hypertensive chronic kidney disease with stage 1 through stage 4 chronic kidney disease, or unspecified chronic kidney disease: Secondary | ICD-10-CM | POA: Diagnosis present

## 2017-05-23 DIAGNOSIS — N179 Acute kidney failure, unspecified: Secondary | ICD-10-CM | POA: Diagnosis present

## 2017-05-23 DIAGNOSIS — E871 Hypo-osmolality and hyponatremia: Secondary | ICD-10-CM | POA: Diagnosis not present

## 2017-05-23 DIAGNOSIS — T451X5A Adverse effect of antineoplastic and immunosuppressive drugs, initial encounter: Secondary | ICD-10-CM | POA: Diagnosis present

## 2017-05-23 DIAGNOSIS — E860 Dehydration: Secondary | ICD-10-CM | POA: Diagnosis present

## 2017-05-23 DIAGNOSIS — Z79899 Other long term (current) drug therapy: Secondary | ICD-10-CM

## 2017-05-23 DIAGNOSIS — R531 Weakness: Secondary | ICD-10-CM | POA: Diagnosis not present

## 2017-05-23 DIAGNOSIS — I10 Essential (primary) hypertension: Secondary | ICD-10-CM | POA: Diagnosis not present

## 2017-05-23 DIAGNOSIS — I152 Hypertension secondary to endocrine disorders: Secondary | ICD-10-CM | POA: Diagnosis present

## 2017-05-23 DIAGNOSIS — K219 Gastro-esophageal reflux disease without esophagitis: Secondary | ICD-10-CM | POA: Diagnosis present

## 2017-05-23 DIAGNOSIS — E1165 Type 2 diabetes mellitus with hyperglycemia: Secondary | ICD-10-CM | POA: Diagnosis present

## 2017-05-23 DIAGNOSIS — C779 Secondary and unspecified malignant neoplasm of lymph node, unspecified: Secondary | ICD-10-CM | POA: Diagnosis present

## 2017-05-23 DIAGNOSIS — R55 Syncope and collapse: Secondary | ICD-10-CM | POA: Diagnosis present

## 2017-05-23 DIAGNOSIS — E1122 Type 2 diabetes mellitus with diabetic chronic kidney disease: Secondary | ICD-10-CM | POA: Diagnosis present

## 2017-05-23 DIAGNOSIS — R404 Transient alteration of awareness: Secondary | ICD-10-CM | POA: Diagnosis not present

## 2017-05-23 DIAGNOSIS — C680 Malignant neoplasm of urethra: Secondary | ICD-10-CM | POA: Diagnosis not present

## 2017-05-23 DIAGNOSIS — N183 Chronic kidney disease, stage 3 unspecified: Secondary | ICD-10-CM | POA: Diagnosis present

## 2017-05-23 DIAGNOSIS — Z6833 Body mass index (BMI) 33.0-33.9, adult: Secondary | ICD-10-CM

## 2017-05-23 DIAGNOSIS — Z87891 Personal history of nicotine dependence: Secondary | ICD-10-CM | POA: Diagnosis not present

## 2017-05-23 DIAGNOSIS — K5900 Constipation, unspecified: Secondary | ICD-10-CM | POA: Diagnosis present

## 2017-05-23 DIAGNOSIS — I4510 Unspecified right bundle-branch block: Secondary | ICD-10-CM | POA: Diagnosis present

## 2017-05-23 DIAGNOSIS — I951 Orthostatic hypotension: Secondary | ICD-10-CM | POA: Diagnosis present

## 2017-05-23 DIAGNOSIS — K30 Functional dyspepsia: Secondary | ICD-10-CM | POA: Diagnosis present

## 2017-05-23 DIAGNOSIS — D701 Agranulocytosis secondary to cancer chemotherapy: Secondary | ICD-10-CM | POA: Diagnosis present

## 2017-05-23 DIAGNOSIS — J029 Acute pharyngitis, unspecified: Secondary | ICD-10-CM | POA: Diagnosis present

## 2017-05-23 DIAGNOSIS — R630 Anorexia: Secondary | ICD-10-CM | POA: Diagnosis present

## 2017-05-23 DIAGNOSIS — G4730 Sleep apnea, unspecified: Secondary | ICD-10-CM | POA: Diagnosis not present

## 2017-05-23 DIAGNOSIS — Z8249 Family history of ischemic heart disease and other diseases of the circulatory system: Secondary | ICD-10-CM

## 2017-05-23 DIAGNOSIS — Z9221 Personal history of antineoplastic chemotherapy: Secondary | ICD-10-CM

## 2017-05-23 DIAGNOSIS — E1159 Type 2 diabetes mellitus with other circulatory complications: Secondary | ICD-10-CM | POA: Diagnosis present

## 2017-05-23 DIAGNOSIS — R7989 Other specified abnormal findings of blood chemistry: Secondary | ICD-10-CM

## 2017-05-23 HISTORY — DX: Malignant (primary) neoplasm, unspecified: C80.1

## 2017-05-23 LAB — COMPREHENSIVE METABOLIC PANEL
ALBUMIN: 2.9 g/dL — AB (ref 3.5–5.0)
ALK PHOS: 55 U/L (ref 38–126)
ALT: 24 U/L (ref 17–63)
AST: 15 U/L (ref 15–41)
Anion gap: 9 (ref 5–15)
BUN: 83 mg/dL — AB (ref 6–20)
CALCIUM: 8.3 mg/dL — AB (ref 8.9–10.3)
CO2: 17 mmol/L — ABNORMAL LOW (ref 22–32)
CREATININE: 4.31 mg/dL — AB (ref 0.61–1.24)
Chloride: 104 mmol/L (ref 101–111)
GFR calc Af Amer: 14 mL/min — ABNORMAL LOW (ref >60)
GFR, EST NON AFRICAN AMERICAN: 12 mL/min — AB (ref >60)
Glucose, Bld: 155 mg/dL — ABNORMAL HIGH (ref 65–99)
POTASSIUM: 3.5 mmol/L (ref 3.5–5.1)
Sodium: 130 mmol/L — ABNORMAL LOW (ref 135–145)
TOTAL PROTEIN: 6.5 g/dL (ref 6.5–8.1)
Total Bilirubin: 0.8 mg/dL (ref 0.3–1.2)

## 2017-05-23 LAB — CBC WITH DIFFERENTIAL/PLATELET
BAND NEUTROPHILS: 0 %
BASOS ABS: 0 10*3/uL (ref 0.0–0.1)
BLASTS: 0 %
Basophils Relative: 0 %
EOS ABS: 0 10*3/uL (ref 0.0–0.7)
Eosinophils Relative: 4 %
HCT: 42.6 % (ref 39.0–52.0)
Hemoglobin: 15.5 g/dL (ref 13.0–17.0)
Lymphocytes Relative: 40 %
Lymphs Abs: 0.2 10*3/uL — ABNORMAL LOW (ref 0.7–4.0)
MCH: 30.9 pg (ref 26.0–34.0)
MCHC: 36.4 g/dL — ABNORMAL HIGH (ref 30.0–36.0)
MCV: 84.9 fL (ref 78.0–100.0)
METAMYELOCYTES PCT: 0 %
MONOS PCT: 19 %
MYELOCYTES: 0 %
Monocytes Absolute: 0.1 10*3/uL (ref 0.1–1.0)
NEUTROS ABS: 0.3 10*3/uL — AB (ref 1.7–7.7)
Neutrophils Relative %: 37 %
Other: 0 %
PLATELETS: 67 10*3/uL — AB (ref 150–400)
Promyelocytes Absolute: 0 %
RBC: 5.02 MIL/uL (ref 4.22–5.81)
RDW: 13.3 % (ref 11.5–15.5)
WBC: 0.6 10*3/uL — AB (ref 4.0–10.5)
nRBC: 0 /100 WBC

## 2017-05-23 LAB — I-STAT TROPONIN, ED: Troponin i, poc: 0.04 ng/mL (ref 0.00–0.08)

## 2017-05-23 MED ORDER — LEVOFLOXACIN IN D5W 500 MG/100ML IV SOLN
500.0000 mg | INTRAVENOUS | Status: DC
  Administered 2017-05-25 – 2017-05-26 (×2): 500 mg via INTRAVENOUS
  Filled 2017-05-23 (×2): qty 100

## 2017-05-23 MED ORDER — POLYETHYLENE GLYCOL 3350 17 G PO PACK
17.0000 g | PACK | Freq: Two times a day (BID) | ORAL | Status: DC
  Administered 2017-05-23 – 2017-05-27 (×2): 17 g via ORAL
  Filled 2017-05-23 (×6): qty 1

## 2017-05-23 MED ORDER — CHLORHEXIDINE GLUCONATE 0.12 % MT SOLN
15.0000 mL | Freq: Two times a day (BID) | OROMUCOSAL | Status: DC
  Administered 2017-05-23: 15 mL via OROMUCOSAL
  Filled 2017-05-23 (×7): qty 15

## 2017-05-23 MED ORDER — MAGIC MOUTHWASH W/LIDOCAINE
15.0000 mL | Freq: Four times a day (QID) | ORAL | Status: DC | PRN
  Filled 2017-05-23: qty 15

## 2017-05-23 MED ORDER — SODIUM CHLORIDE 0.9% FLUSH
3.0000 mL | Freq: Two times a day (BID) | INTRAVENOUS | Status: DC
  Administered 2017-05-23 – 2017-05-27 (×5): 3 mL via INTRAVENOUS

## 2017-05-23 MED ORDER — ACETAMINOPHEN 325 MG PO TABS: 650.0000 mg | ORAL_TABLET | Freq: Four times a day (QID) | ORAL | Status: DC | PRN

## 2017-05-23 MED ORDER — ONDANSETRON HCL 4 MG/2ML IJ SOLN: 4.0000 mg | Freq: Four times a day (QID) | INTRAMUSCULAR | Status: DC | PRN

## 2017-05-23 MED ORDER — LEVOFLOXACIN IN D5W 750 MG/150ML IV SOLN
750.0000 mg | Freq: Once | INTRAVENOUS | Status: AC
  Administered 2017-05-23: 750 mg via INTRAVENOUS
  Filled 2017-05-23: qty 150

## 2017-05-23 MED ORDER — SODIUM CHLORIDE 0.9 % IV BOLUS (SEPSIS)
1000.0000 mL | Freq: Once | INTRAVENOUS | Status: AC
  Administered 2017-05-23: 1000 mL via INTRAVENOUS

## 2017-05-23 MED ORDER — ENSURE ENLIVE PO LIQD
237.0000 mL | Freq: Two times a day (BID) | ORAL | Status: DC
Start: 2017-05-23 — End: 2017-05-24

## 2017-05-23 MED ORDER — AMLODIPINE BESYLATE 5 MG PO TABS
5.0000 mg | ORAL_TABLET | Freq: Every day | ORAL | Status: DC
Start: 2017-05-23 — End: 2017-05-27
  Administered 2017-05-23 – 2017-05-27 (×5): 5 mg via ORAL
  Filled 2017-05-23 (×5): qty 1

## 2017-05-23 MED ORDER — ACETAMINOPHEN 650 MG RE SUPP: 650.0000 mg | Freq: Four times a day (QID) | RECTAL | Status: DC | PRN

## 2017-05-23 MED ORDER — SENNA 8.6 MG PO TABS
2.0000 | ORAL_TABLET | Freq: Every day | ORAL | Status: DC
  Administered 2017-05-23: 17.2 mg via ORAL
  Filled 2017-05-23 (×3): qty 2

## 2017-05-23 MED ORDER — ATORVASTATIN CALCIUM 40 MG PO TABS
40.0000 mg | ORAL_TABLET | Freq: Every day | ORAL | Status: DC
  Administered 2017-05-23 – 2017-05-26 (×4): 40 mg via ORAL
  Filled 2017-05-23 (×4): qty 1

## 2017-05-23 MED ORDER — SODIUM CHLORIDE 0.9 % IV SOLN
INTRAVENOUS | Status: DC
  Administered 2017-05-23 – 2017-05-26 (×7): via INTRAVENOUS

## 2017-05-23 MED ORDER — ORAL CARE MOUTH RINSE
15.0000 mL | Freq: Two times a day (BID) | OROMUCOSAL | Status: DC
  Administered 2017-05-24 – 2017-05-26 (×3): 15 mL via OROMUCOSAL

## 2017-05-23 MED ORDER — DIPHENHYDRAMINE HCL 25 MG PO CAPS
25.0000 mg | ORAL_CAPSULE | Freq: Every day | ORAL | Status: DC
  Administered 2017-05-23 – 2017-05-26 (×4): 25 mg via ORAL
  Filled 2017-05-23 (×4): qty 1

## 2017-05-23 MED ORDER — ONDANSETRON HCL 4 MG PO TABS: 4.0000 mg | ORAL_TABLET | Freq: Four times a day (QID) | ORAL | Status: DC | PRN

## 2017-05-23 MED ORDER — SODIUM CHLORIDE 0.9 % IV SOLN
Freq: Once | INTRAVENOUS | Status: AC
  Administered 2017-05-23: 17:00:00 via INTRAVENOUS

## 2017-05-23 NOTE — ED Notes (Signed)
Bed: WA19 Expected date:  Expected time:  Means of arrival:  Comments: EMS-weakness 

## 2017-05-23 NOTE — Progress Notes (Signed)
Pharmacy Antibiotic Note  Charles Castillo. is a 83 y.o. male admitted on 05/23/2017 with intra-abdominal infection.  Pharmacy has been consulted for levofloxacin dosing.  Pt is at risk for bacteremia due to administration of enema while neutropenic. Per note, Dr. Inda Castillo who recommended empiric antibiotics due to risk of translocation until [name] > 500.    Plan: levofloxacin 750 mg IV x1, then levofloxacin 500 mg IV q48h  Normalized Crcl is 13 ml/min  Height: 6\' 3"  (190.5 cm) Weight: 269 lb 13.5 oz (122.4 kg) IBW/kg (Calculated) : 84.5  Temp (24hrs), Avg:97.7 F (36.5 C), Min:97.7 F (36.5 C), Max:97.7 F (36.5 C)   Recent Labs Lab 05/23/17 1402  WBC 0.6*  CREATININE 4.31*    Estimated Creatinine Clearance: 19.3 mL/min (A) (by C-G formula based on SCr of 4.31 mg/dL (H)).    No Known Allergies  Antimicrobials this admission: 6/5 levofloxacin >>    Dose adjustments this admission: ----  Microbiology results: ---  Thank you for allowing pharmacy to be a part of this patient's care.   Charles Castillo, PharmD, BCPS Pager [phone] 05/23/2017 8:41 PM

## 2017-05-23 NOTE — ED Notes (Signed)
Pt states he was at home on the toilet trying to have a BM and felt dizzy and weak.

## 2017-05-23 NOTE — ED Notes (Signed)
Admitting Dr. Arville Go aware of critical WBC count

## 2017-05-23 NOTE — H&P (Signed)
History and Physical    Charles Castillo. ZCH:[id number] DOB: Feb 06, 1934 DOA: 05/23/2017  Referring MD/NP/PA: Nanda Castillo PCP: Charles Olp, MD   Patient coming from: home  Chief Complaint: syncope  HPI: Charles Castillo. is a 83 y.o. 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  The patient states that he has been constipated for about 10 days. He started chemotherapy last week and after chemotherapy had some nausea and vomiting which is since resolved. He has had severe sore throat which has prevented him from eating and taking much. He has been able to take pills with difficulty. He denies fevers or chills but has felt lightheaded when he sits up or stands up for the last several days. He is not urinating as much. Today, he and his wife called [facility] because of his constipation and they recommended that he perform an enema. His wife gave him an enema while he was lying on the bed.  He walked to the bathroom feeling lightheaded and sat on the toilet. He passed a small amount of hard stool without straining and felt like he was going to pass out.  He denies chest pains or palpitations.  His wife states that his head tilted back and his eyes rolled into his head and he slumped backwards on the toilet without hitting his head or falling to the ground.  She called EMS.  He did not lose control of his bowels or bladder during his episode and did not experience a postictal somnolence after he awoke.  ED Course: Vital signs stable.  Labs:  WBC 0.6, ANC 300, platelets 67, sodium 130, CO2 17, BUN 83, creatinine 4.31 (baseline 1.7).  Troponin negative.  ECG NSR, incomplete RBBB.  Review of Systems:  General:  Denies fevers, chills HEENT:  Denies changes to hearing and vision, sinus congestion.  Positive sore throat CV:  Denies chest pain, palpitations,  lower extremity edema.  PULM:  Denies SOB, cough.   GI:  Denies nausea, vomiting, diarrhea.   GU:  Denies dysuria, frequency, urgency ENDO:  Denies polyuria, polydipsia.   HEME:  Denies blood in stools, abnormal bruising or bleeding.  LYMPH:  Denies lymphadenopathy.   MSK:  Denies arthralgias, myalgias.   DERM:  Denies skin rash or ulcer.   NEURO:  Denies new focal numbness or weakness, slurred speech, confusion.  Severe full body fatigue.   PSYCH:  Denies anxiety and depression.    Past Medical History:  Diagnosis Date  . Arthritis   . BPH (benign prostatic hypertrophy) with urinary obstruction   . Cancer ([place])   . CKD (chronic kidney disease), stage III   . Diet-controlled type 2 diabetes mellitus ([place])   . History of cellulitis    2012-- left lower extremitiy  . History of urinary tract part removal    2008--  partial urethrectomy for SCC  . Hyperlipidemia   . Hypertension   . Organic impotence   . OSA on CPAP    mild to moderate per study 04-19-2011  . Penile lesion     Past Surgical History:  Procedure Laterality Date  . CYSTO/  BILATERAL RETROGRADE PYELOGRAM/  RESECTION PROSTATIC URETHRAL TUMOR/  EXCISION BIOPSY URETHRAL MEATUS AND MEATOTOMY/  TRANSRECTAL ULTRASOUND PROSTATE BIOPSY'S  12-05-2006  . CYSTOSCOPY N/A 06/18/2015   Procedure: CYSTOSCOPY FLEXIBLE;  Surgeon: Irine Seal, MD;  Location: [place];  Service: Urology;  Laterality: N/A;  . PENILE BIOPSY N/A 06/18/2015   Procedure: PENILE BIOPSY;  Surgeon: Irine Seal, MD;  Location: [facility];  Service: Urology;  Laterality: N/A;  . URETHRECTOMY  05-01-2007   Partial  (squamous cell carcinoma )     reports that he quit smoking about 48 years ago. His smoking use included Cigarettes. He has a 25.00 pack-year smoking history. He has never used smokeless tobacco. He reports that he does not drink alcohol or use drugs.  No Known Allergies  Family History  Problem Relation Age of  Onset  . Heart failure Father        CHF, no history MI    Prior to Admission medications   Medication Sig Start Date End Date Taking? Authorizing Provider  amLODipine (NORVASC) 5 MG tablet Take 1 tablet (5 mg total) by mouth daily. 01/06/17  Yes Charles Olp, MD  atorvastatin (LIPITOR) 40 MG tablet Take 1 tablet (40 mg total) by mouth daily. 12/28/16  Yes Charles Olp, MD  DiphenhydrAMINE HCl, Sleep, (SOMINEX PO) Take 1 tablet by mouth at bedtime.    Yes [provider]  aspirin EC 81 MG tablet Take 81 mg by mouth daily.    [provider]    Physical Exam: Vitals:   05/23/17 1240 05/23/17 1415 05/23/17 1630  BP: 110/76 112/74   Pulse: 86 84   Resp: 16 20 20   Temp: 97.7 F (36.5 C)    TempSrc: Oral    SpO2: 97% 96%     Constitutional:  NAD, calm, comfortable Eyes: PERRL, lids and conjunctivae normal ENMT:  Moist mucous membranes.  Difficult to visualize tonsil and posterior OP.  Tongue with white overgrowth but no obvious fixed white plaques on palate or buccal mucosa.    Neck:  No nuchal rigidity, no masses Respiratory:  No wheezes, rales, or rhonchi Cardiovascular: Regular rate and rhythm, no murmurs / rubs / gallops.  2+ radial pulses. Abdomen:  Normal active bowel sounds, soft, mildly distended, nontender Musculoskeletal: Normal muscle tone and bulk.  No contractures.  Skin:  no rashes, abrasions, or ulcers Neurologic:  CN 2-12 grossly intact. Sensation intact to light touch, strength 5/5 throughout Psychiatric:  Alert and oriented x 3. Normal affect.   Labs on Admission: I have personally reviewed following labs and imaging studies  CBC:  Recent Labs Lab 05/23/17 1402  WBC 0.6*  NEUTROABS 0.3*  HGB 15.5  HCT 42.6  MCV 84.9  PLT 67*   Basic Metabolic Panel:  Recent Labs Lab 05/23/17 1402  NA 130*  K 3.5  CL 104  CO2 17*  GLUCOSE 155*  BUN 83*  CREATININE 4.31*  CALCIUM 8.3*   GFR: CrCl cannot be calculated (Unknown ideal  weight.). Liver Function Tests:  Recent Labs Lab 05/23/17 1402  AST 15  ALT 24  ALKPHOS 55  BILITOT 0.8  PROT 6.5  ALBUMIN 2.9*   No results for input(s): LIPASE, AMYLASE in the last 168 hours. No results for input(s): AMMONIA in the last 168 hours. Coagulation Profile: No results for input(s): INR, PROTIME in the last 168 hours. Cardiac Enzymes: No results for input(s): CKTOTAL, CKMB, CKMBINDEX, TROPONINI in the last 168 hours. BNP (last 3 results) No results for input(s): PROBNP in the last 8760 hours. HbA1C: No results for input(s): HGBA1C in the last 72 hours. CBG: No results for input(s): GLUCAP in the last 168 hours. Lipid Profile: No results  for input(s): CHOL, HDL, LDLCALC, TRIG, CHOLHDL, LDLDIRECT in the last 72 hours. Thyroid Function Tests: No results for input(s): TSH, T4TOTAL, FREET4, T3FREE, THYROIDAB in the last 72 hours. Anemia Panel: No results for input(s): VITAMINB12, FOLATE, FERRITIN, TIBC, IRON, RETICCTPCT in the last 72 hours. Urine analysis:    Component Value Date/Time   COLORURINE YELLOW 10/15/2015 1201   APPEARANCEUR HAZY (A) 10/15/2015 1201   LABSPEC 1.025 10/15/2015 1201   PHURINE 5.0 10/15/2015 1201   GLUCOSEU NEGATIVE 10/15/2015 1201   HGBUR NEGATIVE 10/15/2015 1201   HGBUR trace-lysed 11/04/2009 [place] 10/15/2015 1201   BILIRUBINUR neg 09/14/2015 [place] 10/15/2015 1201   PROTEINUR NEGATIVE 10/15/2015 1201   UROBILINOGEN 0.2 10/15/2015 1201   NITRITE NEGATIVE 10/15/2015 1201   LEUKOCYTESUR MODERATE (A) 10/15/2015 1201   Sepsis Labs: @LABRCNTIP (procalcitonin:4,lacticidven:4) )No results found for this or any previous visit (from the past 240 hour(s)).   Radiological Exams on Admission: No results found.  EKG: Independently reviewed. NSR with incomplete RBBB, no acute ischemic changes  Assessment/Plan Principal Problem:   Acute renal failure superimposed on stage 3 chronic kidney disease  (HCC) Active Problems:   Urethral carcinoma (HCC)   Hyperlipidemia   Essential hypertension   Sleep apnea   Syncope   Syncope, likely secondary to dehydration/orthostatic hypotension which is suggested by both labs and history and vagal response while having a BM.  Doubt ACS or arrhythmia, but will monitor overnight on tele.  No signs or symptoms of heart failure, so will not order ECHO.  No fevers or localizing symptoms to suggest sepsis but starting on empiric antibiotics:  See below -  Telemetry -  IVF -  Repeat orthostatics in AM  At risk for bacteremia due to administration of enema while neutropenic -  Case discussed with Dr. Inda Merlin who recommended empiric antibiotics due to risk of translocation until [place] > 500. -  Start levofloxacin  Acute on chronic kidney disease stage 3, creatinine 4.31, baseline 1.7, likely due to dehydration but may also be due to chemotherapy last week.  Denies symptoms of obstruction. -  IVF -  Repeat BMP in AM -  Check UA and RUS  Constipation -  Start bid miralax  -  Senna qhs -  NO rectal medications or temperatures  Sore throat -  Magic mouthwash with lidocaine  Urethral squamous cell carcinoma -  Followed at [facility], but given response to chemotherapy, advised that the patient and wife see if they could establish with a local Oncologist for in-between urgent needs such as prn IVF and labs at the infusion center which may be helpful after next rounds of chemo.   -  Advised them to continue receiving chemo treatments at [facility] with their regular Oncologist  Borderline diabetes, last A1c 6.3, mild hyperglycemia but not tolerating much PO.  At risk for hypoglycemia -  Monitor CBG and start SSI if persistently elevated  Essential hypertension/HLD, blood pressures stable -  Continue norvasc and atorvastatin  Neutropenia and thrombocytopenia due to chemotherapy.  Expect that after IVF, he will also have anemia which has been masked by dehydration.     -  Received neulasta on 6/2 -  Daily CBC with differential   Poor oral intake -  Regular diet with supplements -  Nutrition consultation  OSA, stable, continue cpap  DVT prophylaxis: SCDs  Code Status: full code Family Communication:  Patient and wife, present at bedside Disposition Plan: home in a few days after  AKI has resolved, rehydrated, tolerated diet Consults called: spoke on phone with Dr. Inda Merlin regarding expected degree of cytopenias and side effects from recent chemotherapy  Admission status:  Inpatient, telemetry.  At risk of decompensation due to acute kidney injury with a creatinine of 4.3, neutropenia, thrombocytopenia, recent chemotherapy with immunosuppression and at risk for bacteremia from enema from this morning.    Janece Canterbury MD [facility] Pager [phone]  If 7PM-7AM, please contact night-coverage [email or web address] Password [email or web address]  05/23/2017, 5:55 PM

## 2017-05-23 NOTE — ED Provider Notes (Signed)
Emergency Department Provider Note   I have reviewed the triage vital signs and the nursing notes.   HISTORY  Chief Complaint Near Syncope   HPI Charles Castillo. is a 83 y.o. male with PMH of HLD, HTN, squamous cell carcinoma of the urethra currently on chemotherapy presents to the emergency department for evaluation of syncope. The patient is dealing with constipation and was sitting on the toilet after taking stool softener and an enema. He had a witnessed episode of syncope while sitting on the toilet. He was not trying to stand. No obvious seizure activity. No postictal period. His wife states he is followed by oncology at work and returned home 3 days ago after chemotherapy. Since that time he has had approximately 12 ounces of fluid and drink and total and is not eating. Patient denies any chest pain, difficulty breathing, abdominal pain. He denies nausea. He states is decreased PO intake is 2/2 poor appetite.  Primary Oncology: Dr. Aline Brochure ([facility])  Chemotherapy: Cycle 1: Taxol, Cisplatin, Ifosamide (last 05/19/2017)   Past Medical History:  Diagnosis Date  . Arthritis   . BPH (benign prostatic hypertrophy) with urinary obstruction   . Cancer ([place])   . CKD (chronic kidney disease), stage III   . Diet-controlled type 2 diabetes mellitus ([place])   . History of cellulitis    2012-- left lower extremitiy  . History of urinary tract part removal    2008--  partial urethrectomy for SCC  . Hyperlipidemia   . Hypertension   . Organic impotence   . OSA on CPAP    mild to moderate per study 04-19-2011  . Penile lesion     Patient Active Problem List   Diagnosis Date Noted  . Syncope 05/23/2017  . Acute renal failure superimposed on stage 3 chronic kidney disease ([place]) 05/23/2017  . Gout 05/24/2016  . Erectile dysfunction 09/15/2014  . Diabetes mellitus ([place]) 09/15/2014  . Former smoker 09/15/2014  . Obesity 10/10/2012  . Urethral carcinoma ([place]) 11/04/2009  . CKD  (chronic kidney disease), stage III 03/24/2008  . Hyperlipidemia 08/27/2007  . Essential hypertension 07/03/2007  . Osteoarthritis 07/03/2007  . Sleep apnea 07/03/2007    Past Surgical History:  Procedure Laterality Date  . CYSTO/  BILATERAL RETROGRADE PYELOGRAM/  RESECTION PROSTATIC URETHRAL TUMOR/  EXCISION BIOPSY URETHRAL MEATUS AND MEATOTOMY/  TRANSRECTAL ULTRASOUND PROSTATE BIOPSY'S  12-05-2006  . CYSTOSCOPY N/A 06/18/2015   Procedure: CYSTOSCOPY FLEXIBLE;  Surgeon: Irine Seal, MD;  Location: [facility];  Service: Urology;  Laterality: N/A;  . PENILE BIOPSY N/A 06/18/2015   Procedure: PENILE BIOPSY;  Surgeon: Irine Seal, MD;  Location: [facility];  Service: Urology;  Laterality: N/A;  . URETHRECTOMY  05-01-2007   Partial  (squamous cell carcinoma )      Allergies Patient has no known allergies.  Family History  Problem Relation Age of Onset  . Heart failure Father        CHF, no history MI    Social History Social History  Substance Use Topics  . Smoking status: Former Smoker    Packs/day: 1.00    Years: 25.00    Types: Cigarettes    Quit date: 12/19/1968  . Smokeless tobacco: Never Used  . Alcohol use No    Review of Systems  Constitutional: No fever/chills. Positive fatigue and decreased PO intake.  Eyes: No visual changes. ENT: No sore throat. Cardiovascular: Denies chest pain. Positive syncope.  Respiratory: Denies shortness of breath. Gastrointestinal:  No abdominal pain.  No nausea, no vomiting.  No diarrhea.  No constipation. Genitourinary: Negative for dysuria. Musculoskeletal: Negative for back pain. Skin: Negative for rash. Neurological: Negative for headaches, focal weakness or numbness.  10-point ROS otherwise negative.  ____________________________________________   PHYSICAL EXAM:  VITAL SIGNS: ED Triage Vitals [05/23/17 1240]  Enc Vitals Group     BP 110/76     Pulse Rate 86     Resp 16     Temp 97.7 F  (36.5 C)     Temp Source Oral     SpO2 97 %   Constitutional: Alert and oriented. Appears generally weak and fatigued. . Eyes: Conjunctivae are normal.  Head: Atraumatic. Nose: No congestion/rhinnorhea. Mouth/Throat: Mucous membranes are dry. Oropharynx non-erythematous. Neck: No stridor.   Cardiovascular: Normal rate, regular rhythm. Good peripheral circulation. Grossly normal heart sounds.   Respiratory: Normal respiratory effort.  No retractions. Lungs CTAB. Gastrointestinal: Soft and nontender. No distention.  Musculoskeletal: No lower extremity tenderness nor edema. No gross deformities of extremities. Neurologic:  Normal speech and language. No gross focal neurologic deficits are appreciated.  Skin:  Skin is warm, dry and intact. No rash noted.  ____________________________________________   LABS (all labs ordered are listed, but only abnormal results are displayed)  Labs Reviewed  COMPREHENSIVE METABOLIC PANEL - Abnormal; Notable for the following:       Result Value   Sodium 130 (*)    CO2 17 (*)    Glucose, Bld 155 (*)    BUN 83 (*)    Creatinine, Ser 4.31 (*)    Calcium 8.3 (*)    Albumin 2.9 (*)    GFR calc non Af Amer 12 (*)    GFR calc Af Amer 14 (*)    All other components within normal limits  CBC WITH DIFFERENTIAL/PLATELET - Abnormal; Notable for the following:    WBC 0.6 (*)    MCHC 36.4 (*)    Platelets 67 (*)    Neutro Abs 0.3 (*)    Lymphs Abs 0.2 (*)    All other components within normal limits  URINALYSIS, ROUTINE W REFLEX MICROSCOPIC  BASIC METABOLIC PANEL  CBC WITH DIFFERENTIAL/PLATELET  I-STAT TROPOININ, ED   ____________________________________________  EKG   EKG Interpretation  Date/Time:  Tuesday May 23 2017 12:39:19 EDT Ventricular Rate:  86 PR Interval:    QRS Duration: 115 QT Interval:  396 QTC Calculation: 474 R Axis:   103 Text Interpretation:  Sinus rhythm Incomplete right bundle branch block No STEMI.  Confirmed by Nanda Quinton [phone]) on 05/23/2017 1:06:09 PM      ____________________________________________   PROCEDURES  Procedure(s) performed:   Procedures  None ____________________________________________   INITIAL IMPRESSION / ASSESSMENT AND PLAN / ED COURSE  Pertinent labs & imaging results that were available during my care of the patient were reviewed by me and considered in my medical decision making (see chart for details).  Patient resents to the emergency department for evaluation of near syncope was sitting on the toilet. He recently completed a cycle of chemotherapy at [facility] several days ago. Very little oral intake since returned home. No fevers or chills. No chest pain. EKG similar to prior. An for IV fluids, labs, UA, and reassessment.   Patient with acute renal failure on CMP. Will start addition IVF and plan for hospitalist admission. Updated patient and wife at bedside.   Discussed patient's case with Hospitalist, Dr. Sheran Fava. Patient and family (if present) updated with plan. Care  transferred to Hospitalist service.  I reviewed all nursing notes, vitals, pertinent old records, EKGs, labs, imaging (as available).  Paged Oncologist at [facility] for update but did not receive a call back.  ____________________________________________  FINAL CLINICAL IMPRESSION(S) / ED DIAGNOSES  Final diagnoses:  Acute renal failure, unspecified acute renal failure type ([place])  Near syncope  Primary squamous cell carcinoma of urethra (HCC)     MEDICATIONS GIVEN DURING THIS VISIT:  Medications  feeding supplement (ENSURE ENLIVE) (ENSURE ENLIVE) liquid 237 mL (237 mLs Oral Not Given 05/23/17 1700)  amLODipine (NORVASC) tablet 5 mg (5 mg Oral Given 05/23/17 1846)  atorvastatin (LIPITOR) tablet 40 mg (40 mg Oral Given 05/23/17 1846)  diphenhydrAMINE (BENADRYL) capsule 25 mg (not administered)  sodium chloride flush (NS) 0.9 % injection 3 mL (not administered)  0.9 %  sodium chloride infusion (  Intravenous New Bag/Given 05/23/17 1849)  acetaminophen (TYLENOL) tablet 650 mg (not administered)    Or  acetaminophen (TYLENOL) suppository 650 mg (not administered)  polyethylene glycol (MIRALAX / GLYCOLAX) packet 17 g (not administered)  ondansetron (ZOFRAN) tablet 4 mg (not administered)    Or  ondansetron (ZOFRAN) injection 4 mg (not administered)  senna (SENOKOT) tablet 17.2 mg (not administered)  magic mouthwash w/lidocaine (not administered)  levofloxacin (LEVAQUIN) IVPB 750 mg (750 mg Intravenous New Bag/Given 05/23/17 1852)  sodium chloride 0.9 % bolus 1,000 mL (0 mLs Intravenous Stopped 05/23/17 1637)  0.9 %  sodium chloride infusion ( Intravenous Stopped 05/23/17 1724)     NEW OUTPATIENT MEDICATIONS STARTED DURING THIS VISIT:  None   Note:  This document was prepared using Dragon voice recognition software and may include unintentional dictation errors.  Nanda Quinton, MD Emergency Medicine   [place], [facility], MD 05/23/17 [phone]

## 2017-05-23 NOTE — ED Triage Notes (Signed)
Per EMS: Pt coming from home Cancer pt has lymphoma Got back from [place] Friday Pt has not had good food or fluid intake Near syncopal episode today Pt states he has not had a real BM in 8 days  Last vitals 103/65 88 HR 97% on RA 18 RR CBG 248 "Pre-diabetic" poor skin turgor  22 in Left Wrist, 350 of fluid  No pain, some nausea, no vomitting Weakness with standing.

## 2017-05-24 DIAGNOSIS — E785 Hyperlipidemia, unspecified: Secondary | ICD-10-CM

## 2017-05-24 DIAGNOSIS — N179 Acute kidney failure, unspecified: Principal | ICD-10-CM

## 2017-05-24 DIAGNOSIS — R55 Syncope and collapse: Secondary | ICD-10-CM

## 2017-05-24 DIAGNOSIS — E871 Hypo-osmolality and hyponatremia: Secondary | ICD-10-CM

## 2017-05-24 DIAGNOSIS — E876 Hypokalemia: Secondary | ICD-10-CM

## 2017-05-24 DIAGNOSIS — G4730 Sleep apnea, unspecified: Secondary | ICD-10-CM

## 2017-05-24 DIAGNOSIS — I10 Essential (primary) hypertension: Secondary | ICD-10-CM

## 2017-05-24 DIAGNOSIS — N183 Chronic kidney disease, stage 3 (moderate): Secondary | ICD-10-CM

## 2017-05-24 DIAGNOSIS — C680 Malignant neoplasm of urethra: Secondary | ICD-10-CM

## 2017-05-24 LAB — COMPREHENSIVE METABOLIC PANEL
ALK PHOS: 51 U/L (ref 38–126)
ALT: 19 U/L (ref 17–63)
AST: 13 U/L — AB (ref 15–41)
Albumin: 2.5 g/dL — ABNORMAL LOW (ref 3.5–5.0)
Anion gap: 9 (ref 5–15)
BUN: 88 mg/dL — ABNORMAL HIGH (ref 6–20)
CALCIUM: 7.9 mg/dL — AB (ref 8.9–10.3)
CHLORIDE: 106 mmol/L (ref 101–111)
CO2: 16 mmol/L — AB (ref 22–32)
CREATININE: 4.17 mg/dL — AB (ref 0.61–1.24)
GFR calc Af Amer: 14 mL/min — ABNORMAL LOW (ref >60)
GFR calc non Af Amer: 12 mL/min — ABNORMAL LOW (ref >60)
GLUCOSE: 190 mg/dL — AB (ref 65–99)
Potassium: 3.1 mmol/L — ABNORMAL LOW (ref 3.5–5.1)
SODIUM: 131 mmol/L — AB (ref 135–145)
Total Bilirubin: 0.6 mg/dL (ref 0.3–1.2)
Total Protein: 5.7 g/dL — ABNORMAL LOW (ref 6.5–8.1)

## 2017-05-24 LAB — CBC WITH DIFFERENTIAL/PLATELET
BASOS ABS: 0 10*3/uL (ref 0.0–0.1)
Basophils Relative: 0 %
EOS ABS: 0 10*3/uL (ref 0.0–0.7)
Eosinophils Relative: 2 %
HEMATOCRIT: 37.7 % — AB (ref 39.0–52.0)
Hemoglobin: 13.4 g/dL (ref 13.0–17.0)
LYMPHS ABS: 0.3 10*3/uL — AB (ref 0.7–4.0)
LYMPHS PCT: 38 %
MCH: 30.2 pg (ref 26.0–34.0)
MCHC: 35.5 g/dL (ref 30.0–36.0)
MCV: 84.9 fL (ref 78.0–100.0)
MONOS PCT: 54 %
Monocytes Absolute: 0.4 10*3/uL (ref 0.1–1.0)
NEUTROS ABS: 0 10*3/uL — AB (ref 1.7–7.7)
Neutrophils Relative %: 6 %
Platelets: 43 10*3/uL — ABNORMAL LOW (ref 150–400)
RBC: 4.44 MIL/uL (ref 4.22–5.81)
RDW: 13.3 % (ref 11.5–15.5)
WBC: 0.7 10*3/uL — AB (ref 4.0–10.5)

## 2017-05-24 LAB — MAGNESIUM: Magnesium: 2.2 mg/dL (ref 1.7–2.4)

## 2017-05-24 LAB — PHOSPHORUS: Phosphorus: 5.2 mg/dL — ABNORMAL HIGH (ref 2.5–4.6)

## 2017-05-24 MED ORDER — POTASSIUM CHLORIDE CRYS ER 20 MEQ PO TBCR
40.0000 meq | EXTENDED_RELEASE_TABLET | Freq: Two times a day (BID) | ORAL | Status: AC
  Administered 2017-05-24 (×2): 40 meq via ORAL
  Filled 2017-05-24 (×2): qty 2

## 2017-05-24 MED ORDER — BOOST / RESOURCE BREEZE PO LIQD
1.0000 | Freq: Three times a day (TID) | ORAL | Status: DC
  Administered 2017-05-24 – 2017-05-25 (×3): 1 via ORAL

## 2017-05-24 MED ORDER — FAMOTIDINE 20 MG PO TABS: 20.0000 mg | ORAL_TABLET | Freq: Two times a day (BID) | ORAL | Status: DC

## 2017-05-24 MED ORDER — FAMOTIDINE 20 MG PO TABS
20.0000 mg | ORAL_TABLET | Freq: Every day | ORAL | Status: DC
  Administered 2017-05-24 – 2017-05-26 (×3): 20 mg via ORAL
  Filled 2017-05-24 (×3): qty 1

## 2017-05-24 MED ORDER — GI COCKTAIL ~~LOC~~
30.0000 mL | Freq: Once | ORAL | Status: AC
  Administered 2017-05-24: 30 mL via ORAL
  Filled 2017-05-24: qty 30

## 2017-05-24 MED ORDER — TBO-FILGRASTIM 480 MCG/0.8ML ~~LOC~~ SOSY
480.0000 ug | PREFILLED_SYRINGE | Freq: Every day | SUBCUTANEOUS | Status: DC
  Administered 2017-05-24 – 2017-05-25 (×2): 480 ug via SUBCUTANEOUS
  Filled 2017-05-24 (×2): qty 0.8

## 2017-05-24 NOTE — Progress Notes (Signed)
Pt found already wearing home cpap and tolerating well at this time.  No frays on cord or obvious defects noted.  Machine plugged into a red outlet.

## 2017-05-24 NOTE — Progress Notes (Signed)
Spoke with pt and wife at bedside, there are no HH needs at present time will continue to follow.

## 2017-05-24 NOTE — Progress Notes (Signed)
PROGRESS NOTE    Charles Castillo.  BJY:[id number] DOB: [birth year] DOA: 05/23/2017 PCP: Marin Olp, MD   Brief Narrative:  Charles Castillo. is a 83 y.o. 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  The patient states that he has been constipated for about 10 days. He started chemotherapy last week and after chemotherapy had some nausea and vomiting which is since resolved. He has had severe sore throat which has prevented him from eating and taking much. He has been able to take pills with difficulty. He denies fevers or chills but has felt lightheaded when he sits up or stands up for the last several days. He is not urinating as much. Today, he and his wife called [facility] because of his constipation and they recommended that he perform an enema. His wife gave him an enema while he was lying on the bed.  He walked to the bathroom feeling lightheaded and sat on the toilet. He passed a small amount of hard stool without straining and felt like he was going to pass out.  He denies chest pains or palpitations.  His wife states that his head tilted back and his eyes rolled into his head and he slumped backwards on the toilet without hitting his head or falling to the ground.  She called EMS.  He did not lose control of his bowels or bladder during his episode and did not experience a postictal somnolence after he awoke. He was brought into the ED and admitted for Syncope as well as Neutropenia and Thrombocytopenia. Patient was found to have a severe AKI on CKD so is getting fluid resuscitated. He is slowly improving.   Assessment & Plan:   Principal Problem:   Acute renal failure superimposed on stage 3 chronic kidney disease (HCC) Active Problems:   Urethral carcinoma (HCC)   Hyperlipidemia   Essential hypertension   Sleep apnea   Syncope  Hypokalemia   Hyponatremia  Syncope, likely secondary to dehydration/orthostatic hypotension  -which is suggested by both labs and history and vagal response while having a BM.   -Doubt ACS or arrhythmia, but will monitor on Telemetry.   -No signs or symptoms of heart failure, so will not order ECHO.   -No fevers or localizing symptoms to suggest sepsis but starting on empiric antibiotics as patient is at risk for bacteremia after self administration of Enema -C/w IVF with NS at 100 mL/hr (rate reduced from 125 mL/hr) -Repeat Orthostatics in AM -PT/OT Consult  At risk for bacteremia due to administration of enema while neutropenic - Case discussed with Dr. Mohammad/Dr. Marin Olp who recommended empiric antibiotics due to risk of translocation until ANC > 500. -C/w Levofloxacin 500 mg q48h  Acute on chronic kidney disease stage 3 -Creatinine on admission was 4.31, baseline 1.7, likely due to dehydration but may also be due to chemotherapy last week. (recived Cisplatin and Ifosfamide) -Denies symptoms of obstruction. -C/w IVF with NS at 100 mL/hr (rate reduced from 125 mL/hr) -Repeat CMP in AM -BUN/Cr went from 83/4.31 -> 88/4.17 -Retroperitoneal U/S showed normal size kidneys without hydronephrosis. Subtle findings whichcan be seen with medical renal disease. 2.2 cm cyst over the left upper pole. -Urinalysis still not done  Constipation, improved -C/w Miralax 17 grams po BID and with Senna tab 17.2 mg po qHS -  NO rectal medications or  temperatures  Hypokalemia -Patient's K+ was 3.1 -Replete with po KCl 40 mEq x BID -Continue to Monitor and Replete as Necessary -Repeat CMP in AM  Sore throat -C/w Magic mouthwash with lidocaine 15 mL 4 Times Daily prn  Indigestion/GERD  -Added Famotidine 20 mg po qHS -Gave 1x dose of GI Cocktail  Urethral squamous cell carcinoma now spread to the LN -Recently started Chemotherapy with Cisplatin and Ifosfamide -Followed at [facility], but given  response to chemotherapy, advised that the patient and wife see if they could establish with a local Oncologist for in-between urgent needs such as prn IVF and labs at the infusion center which may be helpful after next rounds of chemo.   -Advised them to continue receiving chemo treatments at [facility] with their regular Oncologist  Borderline Diabetes -Last A1c 6.3, mild hyperglycemia but not tolerating much PO.  At risk for hypoglycemia -Monitor CBG and start SSI if persistently elevated  Essential Hypertension -BP have been stable -C/w Amlodipine 5 mg po Daily  Hyperlipidemia -C/w Atorvastatin 40 mg po Daily  Neutropenia and thrombocytopenia due to chemotherapy.   -WBC went from 0.6 -> 0.7 and Platelet Count went from 67 -> 43 -Expect that after IVF, he will also have anemia which has been masked by dehydration.   -Received neulasta on 6/2; Discussed case with Dr. Marin Olp who recommended starting patient on Granix injections 480 mcg sq Daily -Daily CBC with Differential   Poor oral intake -Regular diet with supplements of Boost/Resource Breeze 1 Container po TID -Nutrition consultation was appreciated  OSA  -Stable  -Continue CPAP   Hyponatremia -Likely in in the setting of Dehydration -C/w IVF as aove -Repeat CMP in AM  DVT prophylaxis: SCDs Code Status: FULL CODE Family Communication: Discussed with wife at bedside Disposition Plan: Home in 1-2 Days if improving and stable  Consultants:   Discussed case with Oncology Dr. Marin Olp  Procedures: None  Antimicrobials:  Anti-infectives    Start     Dose/Rate Route Frequency Ordered Stop   05/25/17 1800  levofloxacin (LEVAQUIN) IVPB 500 mg     500 mg 100 mL/hr over 60 Minutes Intravenous Every 48 hours 05/23/17 2012     05/23/17 1800  levofloxacin (LEVAQUIN) IVPB 750 mg     750 mg 100 mL/hr over 90 Minutes Intravenous  Once 05/23/17 1757 05/23/17 2022     Subjective: Seen and examined and stated he felt better  but was sitting in a BM. No nausea or vomiting. Denied any lightheadedness today. No other concerns or complaints at this time.   Objective: Vitals:   05/23/17 2104 05/24/17 0549 05/24/17 1225 05/24/17 1449  BP: 108/73 130/62 130/68 120/66  Pulse: (!) 113 88 89 96  Resp: 20 20  20   Temp: 99 F (37.2 C) 98 F (36.7 C)  98 F (36.7 C)  TempSrc: Oral Oral  Oral  SpO2: 98% 99%  99%  Weight:      Height:        Intake/Output Summary (Last 24 hours) at 05/24/17 1900 Last data filed at 05/24/17 1300  Gross per 24 hour  Intake           1907.5 ml  Output                0 ml  Net           1907.5 ml   Filed Weights   05/23/17 1949  Weight: 122.4 kg (269 lb 13.5 oz)   Examination: Physical Exam:  Constitutional: NAD and appears calm and comfortable Eyes: Lids and conjunctivae normal, sclerae anicteric  ENMT: External Ears, Nose appear normal. Grossly normal hearing.  Neck: Appears normal, supple, no cervical masses, normal ROM, no appreciable thyromegaly, no jVD Respiratory: Diminished to auscultation bilaterally, no wheezing, rales, rhonchi or crackles. Normal respiratory effort and patient is not tachypenic. No accessory muscle use.  Cardiovascular: RRR, no murmurs / rubs / gallops. S1 and S2 auscultated. No extremity edema.  Abdomen: Soft, non-tender, non-distended. No masses palpated. No appreciable hepatosplenomegaly. Bowel sounds positive x4.  GU: Deferred. Musculoskeletal: No clubbing / cyanosis of digits/nails. No joint deformity upper and lower extremities.  Skin: No rashes, lesions, ulcers on limited skin evaluation. No induration; Warm and dry.  Neurologic: CN 2-12 grossly intact with no focal deficits. Romberg sign cerebellar reflexes not assessed.  Psychiatric: Normal judgment and insight. Alert and oriented x 3. Normal mood and appropriate affect.   Data Reviewed: I have personally reviewed following labs and imaging studies  CBC:  Recent Labs Lab 05/23/17 1402  05/24/17 0803  WBC 0.6* 0.7*  NEUTROABS 0.3* 0.0*  HGB 15.5 13.4  HCT 42.6 37.7*  MCV 84.9 84.9  PLT 67* 43*   Basic Metabolic Panel:  Recent Labs Lab 05/23/17 1402 05/24/17 0840  NA 130* 131*  K 3.5 3.1*  CL 104 106  CO2 17* 16*  GLUCOSE 155* 190*  BUN 83* 88*  CREATININE 4.31* 4.17*  CALCIUM 8.3* 7.9*  MG  --  2.2  PHOS  --  5.2*   GFR: Estimated Creatinine Clearance: 19.9 mL/min (A) (by C-G formula based on SCr of 4.17 mg/dL (H)). Liver Function Tests:  Recent Labs Lab 05/23/17 1402 05/24/17 0840  AST 15 13*  ALT 24 19  ALKPHOS 55 51  BILITOT 0.8 0.6  PROT 6.5 5.7*  ALBUMIN 2.9* 2.5*   No results for input(s): LIPASE, AMYLASE in the last 168 hours. No results for input(s): AMMONIA in the last 168 hours. Coagulation Profile: No results for input(s): INR, PROTIME in the last 168 hours. Cardiac Enzymes: No results for input(s): CKTOTAL, CKMB, CKMBINDEX, TROPONINI in the last 168 hours. BNP (last 3 results) No results for input(s): PROBNP in the last 8760 hours. HbA1C: No results for input(s): HGBA1C in the last 72 hours. CBG: No results for input(s): GLUCAP in the last 168 hours. Lipid Profile: No results for input(s): CHOL, HDL, LDLCALC, TRIG, CHOLHDL, LDLDIRECT in the last 72 hours. Thyroid Function Tests: No results for input(s): TSH, T4TOTAL, FREET4, T3FREE, THYROIDAB in the last 72 hours. Anemia Panel: No results for input(s): VITAMINB12, FOLATE, FERRITIN, TIBC, IRON, RETICCTPCT in the last 72 hours. Sepsis Labs: No results for input(s): PROCALCITON, LATICACIDVEN in the last 168 hours.  No results found for this or any previous visit (from the past 240 hour(s)).   Radiology Studies: US Renal  Result Date: 05/23/2017 CLINICAL DATA:  Acute renal insufficiency. History of urethral cancer. Chronic kidney disease. EXAM: RENAL / URINARY TRACT ULTRASOUND COMPLETE COMPARISON:  None. FINDINGS: Right Kidney: Length: 13.1 cm. Mild generalize cortical  thinning and slight increased cortical echogenicity. No mass or hydronephrosis visualized. Left Kidney:Echogenicity within normal limits Length: 12.5 cm. Minimal cortical thickening and slight increased cortical echogenicity. No mass or hydronephrosis visualized. 2.2 cm cyst over the upper pole. Bladder: Appears normal for degree of bladder distention. IMPRESSION: Normal size kidneys without hydronephrosis. Subtle findings which can be seen with medical renal disease. 2.2 cm cyst over the left upper pole. Electronically Signed  By: Marin Olp M.D.   On: 05/23/2017 21:13   Scheduled Meds: . amLODipine  5 mg Oral Daily  . atorvastatin  40 mg Oral q1800  . chlorhexidine  15 mL Mouth Rinse BID  . diphenhydrAMINE  25 mg Oral QHS  . famotidine  20 mg Oral QHS  . feeding supplement  1 Container Oral TID BM  . gi cocktail  30 mL Oral Once  . mouth rinse  15 mL Mouth Rinse q12n4p  . polyethylene glycol  17 g Oral BID  . potassium chloride  40 mEq Oral BID  . senna  2 tablet Oral QHS  . sodium chloride flush  3 mL Intravenous Q12H  . Tbo-Filgrastim  480 mcg Subcutaneous q1800   Continuous Infusions: . sodium chloride 125 mL/hr at 05/24/17 1817  . [START ON 05/25/2017] levofloxacin (LEVAQUIN) IV       LOS: 1 day   Kerney Elbe, DO [facility] Pager [phone]  If 7PM-7AM, please contact night-coverage [email or web address] Password [email or web address] 05/24/2017, 7:00 PM

## 2017-05-24 NOTE — Progress Notes (Signed)
Initial Nutrition Assessment  DOCUMENTATION CODES:   Obesity unspecified  INTERVENTION:   Provide Boost Breeze po TID, each supplement provides 250 kcal and 9 grams of protein Encourage PO intake RD to continue to monitor  NUTRITION DIAGNOSIS:   Increased nutrient needs related to cancer and cancer related treatments as evidenced by estimated needs.  GOAL:   Patient will meet greater than or equal to 90% of their needs  MONITOR:   PO intake, Supplement acceptance, Labs, Weight trends, I & O's  REASON FOR ASSESSMENT:   Consult Assessment of nutrition requirement/status  ASSESSMENT:   83 y.o. 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  The patient states that he has been constipated for about 10 days. He started chemotherapy last week and after chemotherapy had some nausea and vomiting which is since resolved. He has had severe sore throat which has prevented him from eating and taking much. He has been able to take pills with difficulty. He denies fevers or chills but has felt lightheaded when he sits up or stands up for the last several days. He is not urinating as much. Today, he and his wife called [facility] because of his constipation and they recommended that he perform an enema. His wife gave him an enema while he was lying on the bed.  He walked to the bathroom feeling lightheaded and sat on the toilet. He passed a small amount of hard stool without straining and felt like he was going to pass out.  He denies chest pains or palpitations.  His wife states that his head tilted back and his eyes rolled into his head and he slumped backwards on the toilet without hitting his head or falling to the ground.   Pt in room with wife at bedside. Pt's wife states patient has not had anything to eat since Thursday, May 31st (6 days ago). Pt had 1/2  of an Ensure "reluctantly". Pt states he does not like Ensure supplements. States he likes the "juice kind". RD to order Boost Breeze supplements for patient and d/c Ensure per preferences. Pt has had poor appetite since beginning chemotherapy at [facility]. He was also constipated until today when he has had multiple BMs. States his sore throat has improved as well.  Pt has ordered jello for lunch. Encouraged pt to focus on protein foods with meals and to try and drink all of his supplements.   Per pt, UBW is 278-179 lb. Pt has lost 13 lb since December 2017, which is insignificant for time frame. Nutrition focused physical exam shows no sign of depletion of muscle mass or body fat.  Medications: K-DUR tablet BID, Senokot tablet daily  Labs reviewed: Low Na, K Elevated Phos Mg WNL  GFR: 12  Diet Order:  Diet regular Room service appropriate? Yes; Fluid consistency: Thin  Skin:  Reviewed, no issues  Last BM:  6/5  Height:   Ht Readings from Last 1 Encounters:  05/23/17 6\' 3"  (1.905 m)    Weight:   Wt Readings from Last 1 Encounters:  05/23/17 269 lb 13.5 oz (122.4 kg)    Ideal Body Weight:  89.1 kg  BMI:  Body mass index is 33.73 kg/m.  Estimated Nutritional Needs:   Kcal:  2426-8341  Protein:  125-135g  Fluid:  2.7L/day  EDUCATION NEEDS:   Education needs addressed  Clayton Bibles,  MS, RD, LDN Pager: [phone] After Hours Pager: [phone]

## 2017-05-25 LAB — COMPREHENSIVE METABOLIC PANEL
ALT: 40 U/L (ref 17–63)
AST: 27 U/L (ref 15–41)
Albumin: 2.4 g/dL — ABNORMAL LOW (ref 3.5–5.0)
Alkaline Phosphatase: 61 U/L (ref 38–126)
Anion gap: 9 (ref 5–15)
BUN: 81 mg/dL — ABNORMAL HIGH (ref 6–20)
CALCIUM: 7.8 mg/dL — AB (ref 8.9–10.3)
CHLORIDE: 111 mmol/L (ref 101–111)
CO2: 13 mmol/L — ABNORMAL LOW (ref 22–32)
CREATININE: 4.02 mg/dL — AB (ref 0.61–1.24)
GFR, EST AFRICAN AMERICAN: 15 mL/min — AB (ref >60)
GFR, EST NON AFRICAN AMERICAN: 13 mL/min — AB (ref >60)
Glucose, Bld: 146 mg/dL — ABNORMAL HIGH (ref 65–99)
Potassium: 3.4 mmol/L — ABNORMAL LOW (ref 3.5–5.1)
Sodium: 133 mmol/L — ABNORMAL LOW (ref 135–145)
Total Bilirubin: 0.8 mg/dL (ref 0.3–1.2)
Total Protein: 5.4 g/dL — ABNORMAL LOW (ref 6.5–8.1)

## 2017-05-25 LAB — CBC WITH DIFFERENTIAL/PLATELET
BASOS PCT: 0 %
Basophils Absolute: 0 10*3/uL (ref 0.0–0.1)
Eosinophils Absolute: 0 10*3/uL (ref 0.0–0.7)
Eosinophils Relative: 1 %
HCT: 35.1 % — ABNORMAL LOW (ref 39.0–52.0)
Hemoglobin: 12.5 g/dL — ABNORMAL LOW (ref 13.0–17.0)
LYMPHS ABS: 0.4 10*3/uL — AB (ref 0.7–4.0)
Lymphocytes Relative: 28 %
MCH: 29.6 pg (ref 26.0–34.0)
MCHC: 35.6 g/dL (ref 30.0–36.0)
MCV: 83 fL (ref 78.0–100.0)
MONO ABS: 0.7 10*3/uL (ref 0.1–1.0)
Monocytes Relative: 42 %
NEUTROS ABS: 0.4 10*3/uL — AB (ref 1.7–7.7)
Neutrophils Relative %: 29 %
PLATELETS: 34 10*3/uL — AB (ref 150–400)
RBC: 4.23 MIL/uL (ref 4.22–5.81)
RDW: 13.2 % (ref 11.5–15.5)
WBC: 1.5 10*3/uL — ABNORMAL LOW (ref 4.0–10.5)

## 2017-05-25 LAB — PHOSPHORUS: PHOSPHORUS: 3.9 mg/dL (ref 2.5–4.6)

## 2017-05-25 LAB — MAGNESIUM: MAGNESIUM: 2.2 mg/dL (ref 1.7–2.4)

## 2017-05-25 MED ORDER — IPRATROPIUM-ALBUTEROL 0.5-2.5 (3) MG/3ML IN SOLN
3.0000 mL | Freq: Four times a day (QID) | RESPIRATORY_TRACT | Status: DC
  Administered 2017-05-25: 3 mL via RESPIRATORY_TRACT
  Filled 2017-05-25: qty 3

## 2017-05-25 MED ORDER — GUAIFENESIN ER 600 MG PO TB12
1200.0000 mg | ORAL_TABLET | Freq: Two times a day (BID) | ORAL | Status: DC
Start: 2017-05-25 — End: 2017-05-27
  Administered 2017-05-25 – 2017-05-27 (×5): 1200 mg via ORAL
  Filled 2017-05-25 (×5): qty 2

## 2017-05-25 MED ORDER — IPRATROPIUM-ALBUTEROL 0.5-2.5 (3) MG/3ML IN SOLN
3.0000 mL | Freq: Three times a day (TID) | RESPIRATORY_TRACT | Status: DC
  Administered 2017-05-25 – 2017-05-26 (×4): 3 mL via RESPIRATORY_TRACT
  Filled 2017-05-25 (×4): qty 3

## 2017-05-25 MED ORDER — POTASSIUM CHLORIDE CRYS ER 20 MEQ PO TBCR
40.0000 meq | EXTENDED_RELEASE_TABLET | Freq: Two times a day (BID) | ORAL | Status: AC
  Administered 2017-05-25 (×2): 40 meq via ORAL
  Filled 2017-05-25 (×2): qty 2

## 2017-05-25 MED ORDER — PANTOPRAZOLE SODIUM 40 MG PO TBEC
40.0000 mg | DELAYED_RELEASE_TABLET | Freq: Two times a day (BID) | ORAL | Status: DC
  Administered 2017-05-25 – 2017-05-27 (×4): 40 mg via ORAL
  Filled 2017-05-25 (×4): qty 1

## 2017-05-25 NOTE — Progress Notes (Signed)
RT NOTE:  Pt has home CPAP @ bedside. Pt agrees he is able to put on by himself. No RT assistance needed at this time.

## 2017-05-25 NOTE — Progress Notes (Signed)
PROGRESS NOTE    Charles Lasso.  WGN:[id number] DOB: March 12, 1934 DOA: 05/23/2017 PCP: Charles Olp, MD   Brief Narrative:  Charles Lasso. is a 83 y.o. 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  The patient states that he has been constipated for about 10 days. He started chemotherapy last week and after chemotherapy had some nausea and vomiting which is since resolved. He has had severe sore throat which has prevented him from eating and taking much. He has been able to take pills with difficulty. He denies fevers or chills but has felt lightheaded when he sits up or stands up for the last several days. He is not urinating as much. Today, he and his wife called [facility] because of his constipation and they recommended that he perform an enema. His wife gave him an enema while he was lying on the bed.  He walked to the bathroom feeling lightheaded and sat on the toilet. He passed a small amount of hard stool without straining and felt like he was going to pass out.  He denies chest pains or palpitations.  His wife states that his head tilted back and his eyes rolled into his head and he slumped backwards on the toilet without hitting his head or falling to the ground.  She called EMS.  He did not lose control of his bowels or bladder during his episode and did not experience a postictal somnolence after he awoke. He was brought into the ED and admitted for Syncope as well as Neutropenia and Thrombocytopenia. Patient was found to have a severe AKI on CKD so is getting fluid resuscitated. He is slowly improving and WBC went slightly up and AKI is slowly improving.   Assessment & Plan:   Principal Problem:   Acute renal failure superimposed on stage 3 chronic kidney disease (HCC) Active Problems:   Urethral carcinoma (HCC)   Hyperlipidemia  Essential hypertension   Sleep apnea   Syncope   Hypokalemia   Hyponatremia  Syncope, likely secondary to dehydration/orthostatic hypotension  -which is suggested by both labs and history and vagal response while having a BM.   -Doubt ACS or arrhythmia, but will monitor on Telemetry.   -No signs or symptoms of heart failure, so will not order ECHO.   -No fevers or localizing symptoms to suggest sepsis but starting on empiric antibiotics as patient is at risk for bacteremia after self administration of Enema -C/w IVF with NS at 100 mL/hr (rate reduced from 125 mL/hr) -Repeat Orthostatics in AM -PT/OT Consult to evaluate and Treat  At risk for bacteremia due to administration of enema while neutropenic - Case discussed with Dr. Mohammad/Dr. Marin Castillo who recommended empiric antibiotics due to risk of translocation until ANC > 500. -C/w Levofloxacin 500 mg q48h  Acute on chronic kidney disease stage 3 -Creatinine on admission was 4.31, baseline 1.7, likely due to dehydration but may also be due to chemotherapy last week. (recived Cisplatin and Ifosfamide) -Denies symptoms of obstruction. -C/w IVF with NS at 100 mL/hr (rate reduced from 125 mL/hr) -Repeat CMP in AM -BUN/Cr went from 83/4.31 -> 88/4.17 -> 81/4.01 -Retroperitoneal U/S showed normal size kidneys without hydronephrosis. Subtle findings whichcan be seen with medical renal disease. 2.2 cm cyst over the left upper pole. -Urinalysis still not done  Constipation, improved -C/w Miralax 17 grams  po BID and with Senna tab 17.2 mg po qHS -  NO rectal medications or temperatures  Hypokalemia -Patient's K+ was 3.4 this AM -Replete with po KCl 40 mEq x BID x 2 doses -Continue to Monitor and Replete as Necessary -Repeat CMP in AM  Sore throat -C/w Magic mouthwash with lidocaine 15 mL 4 Times Daily prn  Indigestion/GERD  -Added Famotidine 20 mg po qHS -Started Patient on Pantoprazole 40 mg po BID -Gave 1x dose of GI  Cocktail  Urethral squamous cell carcinoma now spread to the LN -Recently started Chemotherapy with Cisplatin and Ifosfamide -Followed at [facility], but given response to chemotherapy, advised that the patient and wife see if they could establish with a local Oncologist for in-between urgent needs such as prn IVF and labs at the infusion center which may be helpful after next rounds of chemo.   -Advised them to continue receiving chemo treatments at [facility] with their regular Oncologist  Borderline Diabetes -Last A1c 6.3, mild hyperglycemia but not tolerating much PO.  At risk for hypoglycemia -Monitor CBG and start SSI if persistently elevated  Essential Hypertension -BP have been stable -C/w Amlodipine 5 mg po Daily  Hyperlipidemia -C/w Atorvastatin 40 mg po Daily  Neutropenia and thrombocytopenia due to chemotherapy.   -WBC went from 0.6 -> 0.7 -> 1.5 and Platelet Count went from 67 -> 43 -> 34 -Expect that after IVF, he will also have anemia which has been masked by dehydration and Hb/Hct went from 13.4/37.7 -> 12.5/35.1 -Received neulasta on 6/2;  -ANC is 40 -Discussed case with Dr. Marin Castillo yesterday who recommended starting patient on Granix injections 480 mcg sq Daily -Daily CBC with Differential   Poor oral intake -Regular diet with supplements of Boost/Resource Breeze 1 Container po TID -Nutrition consultation was appreciated  OSA  -Stable  -Continue CPAP   Hyponatremia -Sodium slowly improving and went from 130 -> 133 -Likely in in the setting of Dehydration -C/w IVF as above -Repeat CMP in AM  DVT prophylaxis: SCDs Code Status: FULL CODE Family Communication: Discussed with wife at bedside Disposition Plan: Home in 1-2 Days if improving and stable  Consultants:   Discussed case with Oncology Dr. Marin Castillo  Procedures: None  Antimicrobials:  Anti-infectives    Start     Dose/Rate Route Frequency Ordered Stop   05/25/17 1800  levofloxacin (LEVAQUIN) IVPB 500  mg     500 mg 100 mL/hr over 60 Minutes Intravenous Every 48 hours 05/23/17 2012     05/23/17 1800  levofloxacin (LEVAQUIN) IVPB 750 mg     750 mg 100 mL/hr over 90 Minutes Intravenous  Once 05/23/17 1757 05/23/17 2022     Subjective: Seen and examined and stated he felt good but was weak. Denied nausea or vomiting. Had no CP or SOB.    Objective: Vitals:   05/24/17 1449 05/24/17 2019 05/24/17 2213 05/25/17 0527  BP: 120/66 (!) 150/64  127/67  Pulse: 96 (!) 105 (!) 102 91  Resp: 20 20 20 19   Temp: 98 F (36.7 C) 100.2 F (37.9 C)  98.7 F (37.1 C)  TempSrc: Oral Oral  Oral  SpO2: 99% 100% 96% 98%  Weight:      Height:        Intake/Output Summary (Last 24 hours) at 05/25/17 0820 Last data filed at 05/25/17 0027  Gross per 24 hour  Intake          1838.33 ml  Output  0 ml  Net          1838.33 ml   Filed Weights   05/23/17 1949  Weight: 122.4 kg (269 lb 13.5 oz)   Examination: Physical Exam:  Constitutional: Pleasant 83 yo Caucasian Male in NAD who appears comfortable but weak Eyes:  Sclerae anicteric. Conjunctive non-injected ENMT: Grossly normal Hearing. External Ears and nose appear normal Neck: Supple with no visible JVD or visible cervical lymphadenopathy Respiratory: Diminished with a Cough. Mild wheezing but not rales or rhonchi Cardiovascular: Slightly tachycardic but normal rhythm; no m/r/g. Mild LE edema Abdomen: Soft, NT, ND. Bowel Sounds present GU: Deferred Musculoskeletal: No cyanosis and no contratures Skin: Warm and dry. No rashes or lesions noted on limited skin evaluation Neurologic: CN 2-12 grossly intact; No focal deficits appreciated Psychiatric: Normal mood and affect. Intact Judgment and insight. A and O x3  Data Reviewed: I have personally reviewed following labs and imaging studies  CBC:  Recent Labs Lab 05/23/17 1402 05/24/17 0803 05/25/17 0528  WBC 0.6* 0.7* 1.5*  NEUTROABS 0.3* 0.0* 0.4*  HGB 15.5 13.4 12.5*   HCT 42.6 37.7* 35.1*  MCV 84.9 84.9 83.0  PLT 67* 43* 34*   Basic Metabolic Panel:  Recent Labs Lab 05/23/17 1402 05/24/17 0840 05/25/17 0528  NA 130* 131* 133*  K 3.5 3.1* 3.4*  CL 104 106 111  CO2 17* 16* 13*  GLUCOSE 155* 190* 146*  BUN 83* 88* 81*  CREATININE 4.31* 4.17* 4.02*  CALCIUM 8.3* 7.9* 7.8*  MG  --  2.2 2.2  PHOS  --  5.2* 3.9   GFR: Estimated Creatinine Clearance: 20.7 mL/min (A) (by C-G formula based on SCr of 4.02 mg/dL (H)). Liver Function Tests:  Recent Labs Lab 05/23/17 1402 05/24/17 0840 05/25/17 0528  AST 15 13* 27  ALT 24 19 40  ALKPHOS 55 51 61  BILITOT 0.8 0.6 0.8  PROT 6.5 5.7* 5.4*  ALBUMIN 2.9* 2.5* 2.4*   No results for input(s): LIPASE, AMYLASE in the last 168 hours. No results for input(s): AMMONIA in the last 168 hours. Coagulation Profile: No results for input(s): INR, PROTIME in the last 168 hours. Cardiac Enzymes: No results for input(s): CKTOTAL, CKMB, CKMBINDEX, TROPONINI in the last 168 hours. BNP (last 3 results) No results for input(s): PROBNP in the last 8760 hours. HbA1C: No results for input(s): HGBA1C in the last 72 hours. CBG: No results for input(s): GLUCAP in the last 168 hours. Lipid Profile: No results for input(s): CHOL, HDL, LDLCALC, TRIG, CHOLHDL, LDLDIRECT in the last 72 hours. Thyroid Function Tests: No results for input(s): TSH, T4TOTAL, FREET4, T3FREE, THYROIDAB in the last 72 hours. Anemia Panel: No results for input(s): VITAMINB12, FOLATE, FERRITIN, TIBC, IRON, RETICCTPCT in the last 72 hours. Sepsis Labs: No results for input(s): PROCALCITON, LATICACIDVEN in the last 168 hours.  No results found for this or any previous visit (from the past 240 hour(s)).   Radiology Studies: US Renal  Result Date: 05/23/2017 CLINICAL DATA:  Acute renal insufficiency. History of urethral cancer. Chronic kidney disease. EXAM: RENAL / URINARY TRACT ULTRASOUND COMPLETE COMPARISON:  None. FINDINGS: Right Kidney:  Length: 13.1 cm. Mild generalize cortical thinning and slight increased cortical echogenicity. No mass or hydronephrosis visualized. Left Kidney:Echogenicity within normal limits Length: 12.5 cm. Minimal cortical thickening and slight increased cortical echogenicity. No mass or hydronephrosis visualized. 2.2 cm cyst over the upper pole. Bladder: Appears normal for degree of bladder distention. IMPRESSION: Normal size kidneys without hydronephrosis. Subtle findings which  can be seen with medical renal disease. 2.2 cm cyst over the left upper pole. Electronically Signed   By: Charles Castillo M.D.   On: 05/23/2017 21:13   Scheduled Meds: . amLODipine  5 mg Oral Daily  . atorvastatin  40 mg Oral q1800  . chlorhexidine  15 mL Mouth Rinse BID  . diphenhydrAMINE  25 mg Oral QHS  . famotidine  20 mg Oral QHS  . feeding supplement  1 Container Oral TID BM  . mouth rinse  15 mL Mouth Rinse q12n4p  . polyethylene glycol  17 g Oral BID  . potassium chloride  40 mEq Oral BID  . senna  2 tablet Oral QHS  . sodium chloride flush  3 mL Intravenous Q12H  . Tbo-Filgrastim  480 mcg Subcutaneous q1800   Continuous Infusions: . sodium chloride 100 mL/hr at 05/25/17 0254  . levofloxacin (LEVAQUIN) IV      LOS: 2 days   Kerney Elbe, DO [facility] Pager [phone]  If 7PM-7AM, please contact night-coverage [email or web address] Password [email or web address] 05/25/2017, 8:20 AM

## 2017-05-25 NOTE — Progress Notes (Signed)
RT NOTE:  Pt given Flutter per order. RT educated patient on Flutter therapy. Pt gave good effort with therapy x10.

## 2017-05-25 NOTE — Progress Notes (Signed)
Unable to obtain UA due to patient being incontinent

## 2017-05-26 ENCOUNTER — Inpatient Hospital Stay (HOSPITAL_COMMUNITY): Payer: Medicare Other

## 2017-05-26 DIAGNOSIS — R945 Abnormal results of liver function studies: Secondary | ICD-10-CM

## 2017-05-26 LAB — CBC WITH DIFFERENTIAL/PLATELET
BASOS ABS: 0 10*3/uL (ref 0.0–0.1)
Basophils Relative: 0 %
Eosinophils Absolute: 0 10*3/uL (ref 0.0–0.7)
Eosinophils Relative: 1 %
HCT: 33.2 % — ABNORMAL LOW (ref 39.0–52.0)
HEMOGLOBIN: 11.7 g/dL — AB (ref 13.0–17.0)
Lymphocytes Relative: 15 %
Lymphs Abs: 0.5 10*3/uL — ABNORMAL LOW (ref 0.7–4.0)
MCH: 30.3 pg (ref 26.0–34.0)
MCHC: 35.2 g/dL (ref 30.0–36.0)
MCV: 86 fL (ref 78.0–100.0)
MONO ABS: 1.1 10*3/uL — AB (ref 0.1–1.0)
Monocytes Relative: 34 %
NEUTROS ABS: 1.6 10*3/uL — AB (ref 1.7–7.7)
NEUTROS PCT: 50 %
Platelets: 31 10*3/uL — ABNORMAL LOW (ref 150–400)
RBC: 3.86 MIL/uL — ABNORMAL LOW (ref 4.22–5.81)
RDW: 13.8 % (ref 11.5–15.5)
WBC: 3.2 10*3/uL — ABNORMAL LOW (ref 4.0–10.5)

## 2017-05-26 LAB — COMPREHENSIVE METABOLIC PANEL
ALBUMIN: 2 g/dL — AB (ref 3.5–5.0)
ALT: 106 U/L — AB (ref 17–63)
AST: 83 U/L — AB (ref 15–41)
Alkaline Phosphatase: 73 U/L (ref 38–126)
Anion gap: 7 (ref 5–15)
BUN: 71 mg/dL — AB (ref 6–20)
CHLORIDE: 116 mmol/L — AB (ref 101–111)
CO2: 14 mmol/L — AB (ref 22–32)
CREATININE: 3.59 mg/dL — AB (ref 0.61–1.24)
Calcium: 8.1 mg/dL — ABNORMAL LOW (ref 8.9–10.3)
GFR calc non Af Amer: 15 mL/min — ABNORMAL LOW (ref >60)
GFR, EST AFRICAN AMERICAN: 17 mL/min — AB (ref >60)
GLUCOSE: 106 mg/dL — AB (ref 65–99)
Potassium: 3.7 mmol/L (ref 3.5–5.1)
SODIUM: 137 mmol/L (ref 135–145)
Total Bilirubin: 0.4 mg/dL (ref 0.3–1.2)
Total Protein: 5 g/dL — ABNORMAL LOW (ref 6.5–8.1)

## 2017-05-26 LAB — MAGNESIUM: Magnesium: 1.9 mg/dL (ref 1.7–2.4)

## 2017-05-26 LAB — PHOSPHORUS: PHOSPHORUS: 2.4 mg/dL — AB (ref 2.5–4.6)

## 2017-05-26 MED ORDER — DEXTROSE-NACL 5-0.45 % IV SOLN
INTRAVENOUS | Status: DC
  Administered 2017-05-26 – 2017-05-27 (×2): via INTRAVENOUS

## 2017-05-26 MED ORDER — POTASSIUM PHOSPHATE MONOBASIC 500 MG PO TABS
500.0000 mg | ORAL_TABLET | Freq: Three times a day (TID) | ORAL | Status: AC
  Administered 2017-05-26 – 2017-05-27 (×3): 500 mg via ORAL
  Filled 2017-05-26 (×3): qty 1

## 2017-05-26 MED ORDER — IPRATROPIUM-ALBUTEROL 0.5-2.5 (3) MG/3ML IN SOLN: 3.0000 mL | RESPIRATORY_TRACT | Status: DC | PRN

## 2017-05-26 NOTE — Progress Notes (Signed)
Pharmacy Antibiotic Note  Charles Castillo. is a 83 y.o. male admitted on 05/23/2017 with intra-abdominal infection.  Pharmacy has been consulted for levofloxacin dosing.  Pt is at risk for bacteremia due to administration of enema while neutropenic. Per note, Dr. Inda Castillo who recommended empiric antibiotics due to risk of translocation until [name] > 500.  05/26/2017  WBC 3.2, improving Afebrile Scr 3.6, CrCl ~ 46mls/min, improving ANC 1.6  Plan: Continue levaquin 500mg  IV q48h  F/u LOT   Height: 6\' 3"  (190.5 cm) Weight: 269 lb 13.5 oz (122.4 kg) IBW/kg (Calculated) : 84.5  Temp (24hrs), Avg:98.2 F (36.8 C), Min:97.7 F (36.5 C), Max:98.4 F (36.9 C)   Recent Labs Lab 05/23/17 1402 05/24/17 0803 05/24/17 0840 05/25/17 0528 05/26/17 0527  WBC 0.6* 0.7*  --  1.5* 3.2*  CREATININE 4.31*  --  4.17* 4.02* 3.59*    Estimated Creatinine Clearance: 23.1 mL/min (A) (by C-G formula based on SCr of 3.59 mg/dL (H)).    No Known Allergies  Antimicrobials this admission: 6/5 levofloxacin >>    Dose adjustments this admission: ----  Microbiology results: ---  Thank you for allowing pharmacy to be a part of this patient's care.   Charles Castillo RPh 05/26/2017, 11:40 AM Pager [phone]

## 2017-05-26 NOTE — Progress Notes (Signed)
Pt refuses mouthwash because of recent chemo. Pt performed mouth care, brushed teeth, with wife

## 2017-05-26 NOTE — Evaluation (Signed)
Occupational Therapy Evaluation Patient Details Name: Charles Castillo. MRN: [id number] DOB: Jan 01, 1934 Today's Date: 05/26/2017    History of Present Illness 83 y.o. 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 At baseline, he is very active and independent.  At time of evaluation, pt required min A to walk to the bathroom and he was fatiqued after this. Will follow in acute setting with supervision to min guard level goals    Follow Up Recommendations  Supervision/Assistance - 24 hour;Home health OT (depending upon progress)    Equipment Recommendations    none by OT   Recommendations for Other Services       Precautions / Restrictions Precautions Precautions: Fall Restrictions Weight Bearing Restrictions: No      Mobility Bed Mobility Overal bed mobility: Needs Assistance Bed Mobility: Supine to Sit;Sit to Supine     Supine to sit: Supervision Sit to supine: Supervision   General bed mobility comments: for lines  Transfers Overall transfer level: Needs assistance Equipment used: Rolling walker (2 wheeled) Transfers: Sit to/from Stand Sit to Stand: Min assist         General transfer comment: vcs for UE placement    Balance Overall balance assessment: Needs assistance         Standing balance support: Bilateral upper extremity supported Standing balance-Leahy Scale: Poor Standing balance comment: requires UE support                           ADL either performed or assessed with clinical judgement   ADL Overall ADL's : Needs assistance/impaired     Grooming: Oral care;Set up;Bed level (shaving with electric razor)   Upper Body Bathing: Set up;Bed level   Lower Body Bathing:  Moderate assistance;Sit to/from stand   Upper Body Dressing : Minimal assistance;Bed level   Lower Body Dressing: Moderate assistance;Sit to/from stand   Toilet Transfer: Minimal assistance;Ambulation;BSC;RW   Toileting- Clothing Manipulation and Hygiene: Minimal assistance;Sit to/from stand         General ADL Comments: ambulated to bathroom and used commode.  Used gel when sitting EOB.  Wanted to lie back in bed to perform rest of grooming tasks. Pt fatiques quickly.  Wife reports he didn't eat for 12 days and he is NPO today for a scan.  Encouraged small bursts of activity to increase endurance     Vision         Perception     Praxis      Pertinent Vitals/Pain Pain Assessment: No/denies pain     Hand Dominance     Extremity/Trunk Assessment Upper Extremity Assessment Upper Extremity Assessment: Generalized weakness      Cervical / Trunk Assessment Cervical / Trunk Assessment: Normal   Communication Communication Communication: No difficulties   Cognition Arousal/Alertness: Awake/alert Behavior During Therapy: WFL for tasks assessed/performed Overall Cognitive Status: Within Functional Limits for tasks assessed                                     General Comments       Exercises     Shoulder Instructions      Home Living  Family/patient expects to be discharged to:: Private residence Living Arrangements: Spouse/significant other Available Help at Discharge: Family Type of Home: House       Home Layout: One level     Bathroom Shower/Tub: Occupational psychologist: [place]: Bedside commode;Shower seat          Prior Functioning/Environment Level of Independence: Independent                 OT Problem List: Decreased strength;Decreased activity tolerance;Impaired balance (sitting and/or standing);Decreased knowledge of use of DME or AE      OT Treatment/Interventions: Self-care/ADL  training;Energy conservation;DME and/or AE instruction;Patient/family education;Balance training    OT Goals(Current goals can be found in the care plan section) Acute Rehab OT Goals Patient Stated Goal: get back to being independent and active OT Goal Formulation: With patient Time For Goal Achievement: 06/02/17 Potential to Achieve Goals: Good ADL Goals Pt Will Perform Grooming: with set-up;sitting (x 3 tasks) Pt Will Transfer to Toilet: with supervision;ambulating;bedside commode Pt Will Perform Toileting - Clothing Manipulation and hygiene: with supervision;sit to/from stand Pt Will Perform Tub/Shower Transfer: Shower transfer;with min guard assist;ambulating;shower seat Additional ADL Goal #1: pt will initiate at least one rest break for energy conservation  OT Frequency: Min 2X/week   Barriers to D/C:            Co-evaluation              AM-PAC PT "6 Clicks" Daily Activity     Outcome Measure Help from another person eating meals?: None Help from another person taking care of personal grooming?: A Little Help from another person toileting, which includes using toliet, bedpan, or urinal?: A Little Help from another person bathing (including washing, rinsing, drying)?: A Lot Help from another person to put on and taking off regular upper body clothing?: A Little Help from another person to put on and taking off regular lower body clothing?: A Lot 6 Click Score: 17   End of Session    Activity Tolerance: Patient limited by fatigue Patient left: in bed;with call bell/phone within reach;with family/visitor present;with bed alarm set  OT Visit Diagnosis: Unsteadiness on feet (R26.81);Muscle weakness (generalized) (M62.81)                Time: 6256-3893 OT Time Calculation (min): 22 min Charges:  OT General Charges $OT Visit: 1 Procedure OT Evaluation $OT Eval Low Complexity: 1 Procedure G-Codes:     [place],  OTR/L [phone] 05/26/2017  Jovanie Verge 05/26/2017, 3:32 PM

## 2017-05-26 NOTE — Progress Notes (Signed)
Pt selected [facility] for HHPT. Referral given to in house rep. Will need face to face for HHPT.

## 2017-05-26 NOTE — Progress Notes (Signed)
PROGRESS NOTE    Charles Lasso.  BWL:[id number] DOB: 1934/03/17 DOA: 05/23/2017 PCP: Charles Olp, Charles Castillo   Brief Narrative:  Charles Lasso. is a 83 y.o. 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  The patient states that he has been constipated for about 10 days. He started chemotherapy last week and after chemotherapy had some nausea and vomiting which is since resolved. He has had severe sore throat which has prevented him from eating and taking much. He has been able to take pills with difficulty. He denies fevers or chills but has felt lightheaded when he sits up or stands up for the last several days. He is not urinating as much. Today, he and his wife called [facility] because of his constipation and they recommended that he perform an enema. His wife gave him an enema while he was lying on the bed.  He walked to the bathroom feeling lightheaded and sat on the toilet. He passed a small amount of hard stool without straining and felt like he was going to pass out.  He denies chest pains or palpitations.  His wife states that his head tilted back and his eyes rolled into his head and he slumped backwards on the toilet without hitting his head or falling to the ground.  She called EMS.  He did not lose control of his bowels or bladder during his episode and did not experience a postictal somnolence after he awoke. He was brought into the ED and admitted for Syncope as well as Neutropenia and Thrombocytopenia. Patient was found to have a severe AKI on CKD so is getting fluid resuscitated. He is slowly improving and WBC improving and AKI is slowly improving. Patient was found to have abnormal LFT's so will get RUQ U/S and Hepatitis Panel. PT/OT evaluated and recommended patient going home with Home Health PT/OT.   Assessment & Plan:   Principal  Problem:   Acute renal failure superimposed on stage 3 chronic kidney disease (HCC) Active Problems:   Urethral carcinoma (HCC)   Hyperlipidemia   Essential hypertension   Sleep apnea   Syncope   Hypokalemia   Hyponatremia  Syncope, likely secondary to dehydration/orthostatic hypotension  -which was suggested by both labs and history and vagal response while having a BM.   -Doubt ACS or arrhythmia, but will monitor on Telemetry.   -No signs or symptoms of heart failure, so will not order ECHO.   -No fevers or localizing symptoms to suggest sepsis but starting on empiric antibiotics as patient is at risk for bacteremia after self administration of Enema -C/w IVF D5W 1/2 NS at 75 mL/hr  -Repeat Orthostatics pending -PT/OT Consulted and recommended Home Health PT and Home Health OT  At risk for bacteremia due to administration of enema while being neutropenic - Case discussed with Dr. Mohammad/Dr. Marin Castillo who recommended empiric antibiotics due to risk of translocation until ANC > 500. -Will continue Levofloxacin 500 mg q48h as patient's ANC just became greater than 500.   Acute on chronic kidney disease stage 3 -Creatinine on admission was 4.31, baseline 1.7, likely due to dehydration but may also be due to chemotherapy last week. (recived Cisplatin and Ifosfamide) -Denies symptoms of obstruction. -Changed IVF with NS at 100 mL/hr to D5W 1/2 NS at 75 mL/hr -Repeat CMP in AM -BUN/Cr went from 83/4.31 ->  88/4.17 -> 81/4.01 -> 71/3.59 -Retroperitoneal U/S showed normal size kidneys without hydronephrosis. Subtle findings whichcan be seen with medical renal disease. 2.2 cm cyst over the left upper pole. -Urinalysis still not done as patient is incontinent from his Cancer   Constipation, improved -C/w Miralax 17 grams po BID and with Senna tab 17.2 mg po qHS -  NO rectal medications or temperatures  Hypokalemia -Patient's K+ was 3.7 this AM -Continue to Monitor and Replete as  Necessary -Repeat CMP in AM  Hypophosphatemia -Patient's Phos Level was 2.4 this AM -Replete with po KPhos 50 mg po TID x 3 doses -Continue to Monitor and Replete Phos as Necessary -Repeat Phos in AM  Sore throat -C/w Magic mouthwash with lidocaine 15 mL 4 Times Daily prn  Indigestion/GERD  -Added Famotidine 20 mg po qHS -C/w Pantoprazole 40 mg po BID -Gave 1x dose of GI Cocktail  Urethral squamous cell carcinoma now spread to the LN -Was Recently started Chemotherapy with Cisplatin and Ifosfamide -Followed at [facility], but given response to chemotherapy, advised that the patient and wife to see if they could establish with a local Oncologist for in-between urgent needs such as prn IVF and labs at the infusion center which may be helpful after next rounds of chemo.   -Advised them to continue receiving chemo treatments at [facility] with their regular Oncologist  Borderline Diabetes -Last A1c 6.3, mild hyperglycemia but not tolerating much PO. Was at risk for hypoglycemia -C/w Regular Diet  -Monitor CBG and start SSI if persistently elevated  Essential Hypertension -BP have been stable -C/w Amlodipine 5 mg po Daily  Hyperlipidemia -C/w Atorvastatin 40 mg po Daily  Neutropenia and thrombocytopenia due to chemotherapy.   -WBC went from 0.6 -> 0.7 -> 1.5 -> 3.2 and Platelet Count went from 67 -> 43 -> 34 -> 31 -Expect that after IVF, he will also have anemia which has been masked by dehydration and Hb/Hct went from 13.4/37.7 -> 12.5/35.1 -> [id number] -Received neulasta on 6/2;  -ANC is 1600 now -Discussed case with Dr. Marin Castillo of Oncology who had recommended starting patient on Granix injections 480 mcg sq Daily; Will now D/C the Granix Injections as patient's ANC is > 1500 -C/w Daily CBC with Differential   Poor oral intake -C/w Regular diet with supplements of Boost/Resource Breeze 1 Container po TID -Nutrition consultation was appreciated  OSA  -Stable  -Continue CPAP    Hyponatremia, improved -Sodium went from 130 -> 137 -Likely in in the setting of Dehydration -C/w IVF as above -Repeat CMP in AM  Abnormal LFT's/Transaminitis -Patient's AST went from 27 -> 83 and ALT went from 40 -> 106 -No clear Explanation as to why this happened as patient was not hypotensive and does not appear to be on any hepatotoxic medications -Will obtain U/S of RUQ and obtain Acute Hepatitis Panel -Continue to Monitor and repeat CMP in AM  DVT prophylaxis: SCDs Code Status: FULL CODE Family Communication: Discussed with wife at bedside Disposition Plan: Home with [place] in the next 24-48 hours   Consultants:   Discussed case with Oncology Dr. Marin Castillo  Procedures: None  Antimicrobials:  Anti-infectives    Start     Dose/Rate Route Frequency Ordered Stop   05/25/17 1800  levofloxacin (LEVAQUIN) IVPB 500 mg     500 mg 100 mL/hr over 60 Minutes Intravenous Every 48 hours 05/23/17 2012     05/23/17 1800  levofloxacin (LEVAQUIN) IVPB 750 mg     750 mg 100 mL/hr  over 90 Minutes Intravenous  Once 05/23/17 1757 05/23/17 2022     Subjective: Seen and examined and stated felt great and was able to eat breakfast. No nausea or vomiting. Denied any SOB or CP. No other complaints or concerns at this time.   Objective: Vitals:   05/26/17 0553 05/26/17 0851 05/26/17 1320 05/26/17 1431  BP: 129/70  125/65   Pulse: 87  89   Resp: 18  18   Temp: 98.4 F (36.9 C)  98.7 F (37.1 C)   TempSrc: Oral  Oral   SpO2: 95% 94% 96% 96%  Weight:      Height:        Intake/Output Summary (Last 24 hours) at 05/26/17 1550 Last data filed at 05/26/17 1400  Gross per 24 hour  Intake           2707.5 ml  Output              500 ml  Net           2207.5 ml   Filed Weights   05/23/17 1949  Weight: 122.4 kg (269 lb 13.5 oz)   Examination: Physical Exam:  Constitutional: Pleasant 70 Caucasian Male in NAD who appears calm and comfortable Eyes: Sclerae Anicteric,  Conjunctivae Non-injected ENMT: Grossly normal hearing. External Ears and Nose appear Normal Neck: Supple with No JVD or visible Thyromegaly Respiratory: Slightly Diminished at the bases; No appreciable wheezing/rales/rhonchi. Patient was not tachypenic or using any accessory muscles to breathe Cardiovascular: RRR S1 S2; No m/r/g. No appreciable LE Edema today. Abdomen: Soft, NT, ND. Bowel Sounds present GU: Deferred Musculoskeletal: No cyanosis or contractures Skin: Warm and Dry. No rashes or lesions noted on a limited skin evaluation Neurologic: CN 2-12 grossly intact. No focal deficits appreciated Psychiatric: Pleasant mood and affect. A and O x3. Intact judgement and insight.   Data Reviewed: I have personally reviewed following labs and imaging studies  CBC:  Recent Labs Lab 05/23/17 1402 05/24/17 0803 05/25/17 0528 05/26/17 0527  WBC 0.6* 0.7* 1.5* 3.2*  NEUTROABS 0.3* 0.0* 0.4* 1.6*  HGB 15.5 13.4 12.5* 11.7*  HCT 42.6 37.7* 35.1* 33.2*  MCV 84.9 84.9 83.0 86.0  PLT 67* 43* 34* 31*   Basic Metabolic Panel:  Recent Labs Lab 05/23/17 1402 05/24/17 0840 05/25/17 0528 05/26/17 0527  NA 130* 131* 133* 137  K 3.5 3.1* 3.4* 3.7  CL 104 106 111 116*  CO2 17* 16* 13* 14*  GLUCOSE 155* 190* 146* 106*  BUN 83* 88* 81* 71*  CREATININE 4.31* 4.17* 4.02* 3.59*  CALCIUM 8.3* 7.9* 7.8* 8.1*  MG  --  2.2 2.2 1.9  PHOS  --  5.2* 3.9 2.4*   GFR: Estimated Creatinine Clearance: 23.1 mL/min (A) (by C-G formula based on SCr of 3.59 mg/dL (H)). Liver Function Tests:  Recent Labs Lab 05/23/17 1402 05/24/17 0840 05/25/17 0528 05/26/17 0527  AST 15 13* 27 83*  ALT 24 19 40 106*  ALKPHOS 55 51 61 73  BILITOT 0.8 0.6 0.8 0.4  PROT 6.5 5.7* 5.4* 5.0*  ALBUMIN 2.9* 2.5* 2.4* 2.0*   No results for input(s): LIPASE, AMYLASE in the last 168 hours. No results for input(s): AMMONIA in the last 168 hours. Coagulation Profile: No results for input(s): INR, PROTIME in the last  168 hours. Cardiac Enzymes: No results for input(s): CKTOTAL, CKMB, CKMBINDEX, TROPONINI in the last 168 hours. BNP (last 3 results) No results for input(s): PROBNP in the last 8760 hours. HbA1C:  No results for input(s): HGBA1C in the last 72 hours. CBG: No results for input(s): GLUCAP in the last 168 hours. Lipid Profile: No results for input(s): CHOL, HDL, LDLCALC, TRIG, CHOLHDL, LDLDIRECT in the last 72 hours. Thyroid Function Tests: No results for input(s): TSH, T4TOTAL, FREET4, T3FREE, THYROIDAB in the last 72 hours. Anemia Panel: No results for input(s): VITAMINB12, FOLATE, FERRITIN, TIBC, IRON, RETICCTPCT in the last 72 hours. Sepsis Labs: No results for input(s): PROCALCITON, LATICACIDVEN in the last 168 hours.  No results found for this or any previous visit (from the past 240 hour(s)).   Radiology Studies: No results found. Scheduled Meds: . amLODipine  5 mg Oral Daily  . atorvastatin  40 mg Oral q1800  . chlorhexidine  15 mL Mouth Rinse BID  . diphenhydrAMINE  25 mg Oral QHS  . famotidine  20 mg Oral QHS  . feeding supplement  1 Container Oral TID BM  . guaiFENesin  1,200 mg Oral BID  . ipratropium-albuterol  3 mL Nebulization TID  . mouth rinse  15 mL Mouth Rinse q12n4p  . pantoprazole  40 mg Oral BID AC  . polyethylene glycol  17 g Oral BID  . potassium phosphate (monobasic)  500 mg Oral TID WC  . senna  2 tablet Oral QHS  . sodium chloride flush  3 mL Intravenous Q12H   Continuous Infusions: . dextrose 5 % and 0.45% NaCl 75 mL/hr at 05/26/17 1122  . levofloxacin (LEVAQUIN) IV Stopped (05/25/17 1933)    LOS: 3 days   Kerney Elbe, DO [facility] Pager [phone]  If 7PM-7AM, please contact night-coverage [email or web address] Password [email or web address] 05/26/2017, 3:50 PM

## 2017-05-26 NOTE — Progress Notes (Signed)
Orthostatic BP completed. Unable to assess 3 minute standing BP, pt fatigued

## 2017-05-26 NOTE — Evaluation (Signed)
Physical Therapy Evaluation Patient Details Name: Charles Castillo. MRN: [id number] DOB: [birth year] Today's Date: 05/26/2017   History of Present Illness  83 y.o. 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 Pt currently with functional limitations due to the deficits listed below (see PT Problem List).  Pt will benefit from skilled PT to increase their independence and safety with mobility to allow discharge to the venue listed below.   Pt reports not feeling well but agreeable to mobilize as tolerated.  Pt ambulated short distance requiring min assist at times for unsteadiness however denies dizziness.  Spouse feels she can care for pt at home as long as he continues ambulating and remains mobile.     Follow Up Recommendations Home health PT;Supervision for mobility/OOB    Equipment Recommendations  None recommended by PT    Recommendations for Other Services       Precautions / Restrictions Precautions Precautions: Fall      Mobility  Bed Mobility Overal bed mobility: Needs Assistance Bed Mobility: Supine to Sit;Sit to Supine     Supine to sit: Supervision Sit to supine: Supervision      Transfers Overall transfer level: Needs assistance Equipment used: Rolling walker (2 wheeled) Transfers: Sit to/from Stand Sit to Stand: Min assist         General transfer comment: verbal cues for safe technique, assist to rise and steady  Ambulation/Gait Ambulation/Gait assistance: Min assist Ambulation Distance (Feet): 60 Feet Assistive device: Rolling walker (2 wheeled) Gait Pattern/deviations: Step-through pattern;Decreased stride length;Narrow base of support;Scissoring     General Gait Details: pt with occasional  unsteadiness (a few instances of scissoring type gait) requiring assist, distance to tolerance, pt fatigues quickly  Stairs            Wheelchair Mobility    Modified Rankin (Stroke Patients Only)       Balance Overall balance assessment: Needs assistance         Standing balance support: Bilateral upper extremity supported Standing balance-Leahy Scale: Poor Standing balance comment: requires UE support                             Pertinent Vitals/Pain Pain Assessment: No/denies pain    Home Living Family/patient expects to be discharged to:: Private residence Living Arrangements: Spouse/significant other   Type of Home: [place]: One level Home Equipment: Environmental consultant - 2 wheels      Prior Function Level of Independence: Independent               Hand Dominance        Extremity/Trunk Assessment        Lower Extremity Assessment Lower Extremity Assessment: Generalized weakness    Cervical / Trunk Assessment Cervical / Trunk Assessment: Normal  Communication   Communication: No difficulties  Cognition Arousal/Alertness: Awake/alert Behavior During Therapy: WFL for tasks assessed/performed Overall Cognitive Status: Within Functional Limits for tasks assessed                                        General Comments  Exercises     Assessment/Plan    PT Assessment Patient needs continued PT services  PT Problem List Decreased strength;Decreased activity tolerance;Decreased knowledge of use of DME;Decreased balance;Decreased mobility       PT Treatment Interventions DME instruction;Gait training;Therapeutic exercise;Therapeutic activities;Patient/family education;Functional mobility training    PT Goals (Current goals can be found in the Care Plan section)  Acute Rehab PT Goals PT Goal Formulation: With patient/family Time For Goal Achievement: 06/02/17 Potential to Achieve Goals: Good     Frequency Min 3X/week   Barriers to discharge        Co-evaluation               AM-PAC PT "6 Clicks" Daily Activity  Outcome Measure Difficulty turning over in bed (including adjusting bedclothes, sheets and blankets)?: None Difficulty moving from lying on back to sitting on the side of the bed? : None Difficulty sitting down on and standing up from a chair with arms (e.g., wheelchair, bedside commode, etc,.)?: A Little Help needed moving to and from a bed to chair (including a wheelchair)?: A Little Help needed walking in hospital room?: A Little Help needed climbing 3-5 steps with a railing? : A Little 6 Click Score: 20    End of Session Equipment Utilized During Treatment: Gait belt Activity Tolerance: Patient limited by fatigue Patient left: in bed;with call bell/phone within reach;with family/visitor present;with bed alarm set Nurse Communication: Mobility status PT Visit Diagnosis: Difficulty in walking, not elsewhere classified (R26.2)    Time: 1314-3888 PT Time Calculation (min) (ACUTE ONLY): 25 min   Charges:   PT Evaluation $PT Eval Low Complexity: 1 Procedure     PT G CodesCarmelia Bake, PT, DPT 05/26/2017 Pager: [phone]   York Ram E 05/26/2017, 1:15 PM

## 2017-05-27 LAB — CBC WITH DIFFERENTIAL/PLATELET
BASOS PCT: 0 %
Basophils Absolute: 0 10*3/uL (ref 0.0–0.1)
EOS PCT: 0 %
Eosinophils Absolute: 0 10*3/uL (ref 0.0–0.7)
HCT: 34.1 % — ABNORMAL LOW (ref 39.0–52.0)
Hemoglobin: 12.4 g/dL — ABNORMAL LOW (ref 13.0–17.0)
Lymphocytes Relative: 12 %
Lymphs Abs: 0.7 10*3/uL (ref 0.7–4.0)
MCH: 31 pg (ref 26.0–34.0)
MCHC: 36.4 g/dL — AB (ref 30.0–36.0)
MCV: 85.3 fL (ref 78.0–100.0)
MONO ABS: 0.9 10*3/uL (ref 0.1–1.0)
Monocytes Relative: 14 %
Neutro Abs: 4.5 10*3/uL (ref 1.7–7.7)
Neutrophils Relative %: 74 %
PLATELETS: 36 10*3/uL — AB (ref 150–400)
RBC: 4 MIL/uL — AB (ref 4.22–5.81)
RDW: 13.9 % (ref 11.5–15.5)
WBC: 6.1 10*3/uL (ref 4.0–10.5)

## 2017-05-27 LAB — COMPREHENSIVE METABOLIC PANEL
ALT: 135 U/L — ABNORMAL HIGH (ref 17–63)
ANION GAP: 7 (ref 5–15)
AST: 67 U/L — ABNORMAL HIGH (ref 15–41)
Albumin: 2.3 g/dL — ABNORMAL LOW (ref 3.5–5.0)
Alkaline Phosphatase: 84 U/L (ref 38–126)
BILIRUBIN TOTAL: 0.8 mg/dL (ref 0.3–1.2)
BUN: 58 mg/dL — ABNORMAL HIGH (ref 6–20)
CHLORIDE: 116 mmol/L — AB (ref 101–111)
CO2: 14 mmol/L — ABNORMAL LOW (ref 22–32)
Calcium: 8.2 mg/dL — ABNORMAL LOW (ref 8.9–10.3)
Creatinine, Ser: 3.38 mg/dL — ABNORMAL HIGH (ref 0.61–1.24)
GFR, EST AFRICAN AMERICAN: 18 mL/min — AB (ref >60)
GFR, EST NON AFRICAN AMERICAN: 16 mL/min — AB (ref >60)
Glucose, Bld: 122 mg/dL — ABNORMAL HIGH (ref 65–99)
POTASSIUM: 3.6 mmol/L (ref 3.5–5.1)
Sodium: 137 mmol/L (ref 135–145)
Total Protein: 5.2 g/dL — ABNORMAL LOW (ref 6.5–8.1)

## 2017-05-27 LAB — HEPATITIS PANEL, ACUTE
HEP B S AG: NEGATIVE
Hep A IgM: NEGATIVE
Hep B C IgM: NEGATIVE

## 2017-05-27 LAB — PHOSPHORUS: PHOSPHORUS: 2.9 mg/dL (ref 2.5–4.6)

## 2017-05-27 LAB — MAGNESIUM: MAGNESIUM: 1.8 mg/dL (ref 1.7–2.4)

## 2017-05-27 MED ORDER — FAMOTIDINE 20 MG PO TABS: 20.0000 mg | ORAL_TABLET | Freq: Every day | ORAL | 0 refills | Status: DC

## 2017-05-27 MED ORDER — SENNA 8.6 MG PO TABS: 2.0000 | ORAL_TABLET | Freq: Every day | ORAL | 0 refills | Status: DC

## 2017-05-27 MED ORDER — POLYETHYLENE GLYCOL 3350 17 G PO PACK: 17.0000 g | PACK | Freq: Every day | ORAL | 0 refills | Status: DC | PRN

## 2017-05-27 MED ORDER — BOOST / RESOURCE BREEZE PO LIQD: 1.0000 | Freq: Three times a day (TID) | ORAL | 0 refills | Status: DC

## 2017-05-27 MED ORDER — PANTOPRAZOLE SODIUM 40 MG PO TBEC: 40.0000 mg | DELAYED_RELEASE_TABLET | Freq: Two times a day (BID) | ORAL | 0 refills | Status: DC

## 2017-05-27 MED ORDER — GUAIFENESIN ER 600 MG PO TB12: 1200.0000 mg | ORAL_TABLET | Freq: Two times a day (BID) | ORAL | 0 refills | Status: DC

## 2017-05-27 NOTE — Progress Notes (Signed)
Patient requested that the IV infusion be stopped as he is possibly going home today.  PCP was notified.

## 2017-05-27 NOTE — Discharge Summary (Signed)
Physician Discharge Summary  Charles Castillo. TFT:[id number] DOB: May 27, 1934 DOA: 05/23/2017  PCP: Marin Olp, MD  Admit date: 05/23/2017 Discharge date: 05/27/2017  Admitted From: Home Disposition:  Home with [place] [facility]  Recommendations for Outpatient Follow-up:  1. Follow up with PCP in 1-2 weeks 2. Follow up with [facility] as an outpatient 3. Establish with local Oncologist for interm visits between [facility] 4. Please obtain CMP/CBC/Mag/Phos in one week 5.  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 The patient states that he has been constipated for about 10 days. He started chemotherapy last week and after chemotherapy had some nausea and vomiting which is since resolved. He has had severe sore throat which has prevented him from eating and taking much. He has been able to take pills with difficulty. He denies fevers or chills but has felt lightheaded when he sits up or stands up for the last several days. He is not urinating as much. Today, he and his wife called [facility] because of his constipation and they recommended that he perform an enema. His wife gave him an enema while he was lying on the bed. He walked to the bathroom feeling lightheaded and sat on the toilet. He passed a small amount of hard stool without straining and felt like he was going to pass out. He denies chest pains or palpitations. His wife states that his head tilted back and his eyes rolled into his head and he slumped backwards on the toilet without hitting his head or falling to the ground. She called EMS. He did not lose control of his bowels or bladder during his  episode and did not experience a postictal somnolence after he awoke.   He was brought into the ED and admitted for Syncope as well as Neutropenia and Thrombocytopenia. Patient was found to have a severe AKI on CKD so was getting fluid resuscitated. His WBC improved significantly after initiating Granix and AKI is slowly improving. Patient was found to have abnormal LFT's obtained a RUQ U/S which showed fatty liver disease and a Hepatitis Panel which was negative. PT/OT evaluated and recommended patient going home with Home Health PT/OT. Patient felt better than he did and was deemed stable to D/C Home and will follow up with Dr. Yong Channel to recheck his Kidney and Liver Function. He will also go back to [facility] to re-evaluate Chemotherapy he received.     Discharge Diagnoses:  Principal Problem:   Acute renal failure superimposed on stage 3 chronic kidney disease (HCC) Active Problems:   Urethral carcinoma (HCC)   Hyperlipidemia   Essential hypertension   Sleep apnea   Syncope   Hypokalemia   Hyponatremia  Syncope, likely secondary to dehydration/orthostatic hypotension  -which was suggested by both labs and history and vagal response while having a BM.  -Doubt ACS or arrhythmia, but will monitor on Telemetry.  -No signs or symptoms of heart failure, so will not order ECHO.  -No fevers or localizing symptoms to suggest sepsis but starting on empiric antibiotics as patient is at risk for bacteremia after self administration of Enema -C/w IVF D5W 1/2 NS at 75 mL/hr  -Repeat Orthostatics showed that patient was not  Orthostatic -PT/OT Consulted and recommended Home Health PT and [place] OT -Will need to follow up with PCP  At risk for bacteremia due to administration of enema while being neutropenic -Case discussed with Dr. Mohammad/Dr. Marin Olp who recommended empiric antibiotics due to risk of translocation until [place] >500. -Was continued continue Levofloxacin 500 mg q48h as patient's  ANC just became greater than 500 yesterday. -ANC this AM was 4500 so Abx were stopped prior to D/C  Acute on chronic kidney disease stage 3, improving -Creatinine on admission was 4.31, baseline 1.7, likely due to dehydration but may also be due to chemotherapy last week. (recived Cisplatin and Ifosfamide) -Denies symptoms of obstruction. -Changed IVF with NS at 100 mL/hr to D5W 1/2 NS at 75 mL/hr yesterday  -Repeat CMP as an outpatient with PCP -BUN/Cr went from 83/4.31 -> 88/4.17 -> 81/4.01 -> 71/3.59 -> 58/3.38 -Retroperitoneal U/S showed normal size kidneys without hydronephrosis. Subtle findings whichcan be seen with medical renal disease. 2.2 cm cyst over the left upper pole. -Urinalysis still not done as patient is incontinent from his Cancer  -Will need to follow up with PCP to recheck Kidney Function. Encouraged patient to remain hydrated by increasing po intake  Constipation, improved -C/w Miralax 17 grams po BID and with Senna tab 17.2 mg po qHS -NO rectal medications or temperatures  Hypokalemia, improbed -Patient's K+ was 3.6 this AM -Continue to Monitor and Replete as Necessary -Repeat CMP in AM  Hypophosphatemia -Patient's Phos Level was 2.9 this AM -Replete with po KPhos 50 mg po TID x 3 doses -Continue to Monitor and Replete Phos as Necessary -Repeat Phos as an outpatient   Sore throat -C/w Magic mouthwash with lidocaine 15 mL 4 Times Daily prn  Indigestion/GERD  -Added Famotidine 20 mg po qHS -C/w Pantoprazole 40 mg po BID -Gave 1x dose of GI Cocktail  Urethral squamous cell carcinoma now spread to the LN -Was Recently started Chemotherapy with Cisplatin and Ifosfamide -Followed at [facility], but given response to chemotherapy, advised that the patient and wife to see if they could establish with a local Oncologist for in-between urgent needs such as prn IVF and labs at the infusion center which may be helpful after next rounds of chemo.  -Advised them to  continue receiving chemo treatments at [facility] with their regular Oncologist -Follow up with Primary Oncologist  Borderline Diabetes -Last A1c 6.3, mild hyperglycemia but not tolerating much PO. Was at risk for hypoglycemia -C/w Regular Diet  -Monitor CBG and start SSI if persistently elevated  Essential Hypertension -BP have been stable -C/w Amlodipine 5 mg po Daily  Hyperlipidemia -C/w Atorvastatin 40 mg po Daily  Neutropenia and thrombocytopenia due to chemotherapy.  -WBC went from 0.6 -> 0.7 -> 1.5 -> 3.2 -> 6.1; and Platelet Count went from 67 -> 43 -> 34 -> 31 -> 36 -Expect that after IVF, he will also have anemia which has been masked by dehydration and Hb/Hct went from 13.4/37.7 -> 12.5/35.1 -> [id number] -> 12.4/34.1 -Received neulasta on 6/2;  -ANC is 4500 now -Discussed case with Dr. Marin Olp of Oncology who had recommended starting patient on Granix injections 480 mcg sq Daily; D/C'd the Granix Injections as patient's ANC is > 1500 -CBC with Differential as an outpatient  Poor oral intake, improved -C/w Regular diet with supplements of Boost/Resource Breeze 1 Container po TID -Nutrition consultation was appreciated  OSA  -Stable  -Continue CPAP   Hyponatremia, improved -Sodium went from 130 -> 137 -Likely in in  the setting of Dehydration -Repeat CMP in as an outpatient   Abnormal LFT's/Transaminitis -Patient's AST went from 27 -> 83 -> 67 and ALT went from 40 -> 106 -> 135 -No clear Explanation as to why this happened as patient was not hypotensive and does not appear to be on any hepatotoxic medications -Obtain U/S of RUQ and showed Echogenic, likely fatty infiltration, though this can be seen with cirrhosis and certain infiltrative disorders. No definite focal hepatic mass or nodularity identified though assessment of intrahepatic detail is limited by sound attenuation. Hepatopetal portal venous flow no intrahepatic biliary dilatation. No RIGHT upper quadrant  free fluid. -Obtained Acute Hepatitis Panel and was negative -Continue to Monitor and repeat CMP as an outpatient with PCP to continue monitor LFT's  -If remain elevated consider GI consultation as an outpatient.   Discharge Instructions  Discharge Instructions    Call MD for:  difficulty breathing, headache or visual disturbances    Complete by:  As directed    Call MD for:  extreme fatigue    Complete by:  As directed    Call MD for:  persistant dizziness or light-headedness    Complete by:  As directed    Call MD for:  persistant nausea and vomiting    Complete by:  As directed    Call MD for:  redness, tenderness, or signs of infection (pain, swelling, redness, odor or green/yellow discharge around incision site)    Complete by:  As directed    Call MD for:  severe uncontrolled pain    Complete by:  As directed    Call MD for:  temperature >100.4    Complete by:  As directed    Diet - low sodium heart healthy    Complete by:  As directed    Discharge instructions    Complete by:  As directed    Follow up with PCP and [facility]. Take all medications as prescribed. If symptoms change or worsen please return to the ED for evaluation.   Increase activity slowly    Complete by:  As directed      Allergies as of 05/27/2017   No Known Allergies     Medication List    TAKE these medications   amLODipine 5 MG tablet Commonly known as:  NORVASC Take 1 tablet (5 mg total) by mouth daily.   aspirin EC 81 MG tablet Take 81 mg by mouth daily.   atorvastatin 40 MG tablet Commonly known as:  LIPITOR Take 1 tablet (40 mg total) by mouth daily.   famotidine 20 MG tablet Commonly known as:  PEPCID Take 1 tablet (20 mg total) by mouth at bedtime.   feeding supplement Liqd Take 1 Container by mouth 3 (three) times daily between meals.   guaiFENesin 600 MG 12 hr tablet Commonly known as:  MUCINEX Take 2 tablets (1,200 mg total) by mouth 2 (two) times daily.   pantoprazole  40 MG tablet Commonly known as:  PROTONIX Take 1 tablet (40 mg total) by mouth 2 (two) times daily before a meal.   polyethylene glycol packet Commonly known as:  MIRALAX / GLYCOLAX Take 17 g by mouth daily as needed.   senna 8.6 MG Tabs tablet Commonly known as:  SENOKOT Take 2 tablets (17.2 mg total) by mouth at bedtime.   SOMINEX PO Take 1 tablet by mouth at bedtime.      Follow-up Information    [facility] Follow up.   Why:  Home Health Physical Therapy Contact information: [address] [place] [address] [phone]          No Known Allergies  Consultations:  Discussed Case with Oncology Dr. Marin Olp  Procedures/Studies: US Renal  Result Date: 05/23/2017 CLINICAL DATA:  Acute renal insufficiency. History of urethral cancer. Chronic kidney disease. EXAM: RENAL / URINARY TRACT ULTRASOUND COMPLETE COMPARISON:  None. FINDINGS: Right Kidney: Length: 13.1 cm. Mild generalize cortical thinning and slight increased cortical echogenicity. No mass or hydronephrosis visualized. Left Kidney:Echogenicity within normal limits Length: 12.5 cm. Minimal cortical thickening and slight increased cortical echogenicity. No mass or hydronephrosis visualized. 2.2 cm cyst over the upper pole. Bladder: Appears normal for degree of bladder distention. IMPRESSION: Normal size kidneys without hydronephrosis. Subtle findings which can be seen with medical renal disease. 2.2 cm cyst over the left upper pole. Electronically Signed   By: Marin Olp M.D.   On: 05/23/2017 21:13   US Abdomen Limited Ruq  Result Date: 05/26/2017 CLINICAL DATA:  Elevated LFTs EXAM: ULTRASOUND ABDOMEN LIMITED RIGHT UPPER QUADRANT COMPARISON:  None FINDINGS: Gallbladder: Normally distended. Upper normal wall thickness. No gallstones, pericholecystic fluid or sonographic Murphy sign. Common bile duct: Not adequately visualized question obscured secondary to body habitus and attenuation of the  ultrasound beam by increased hepatic echogenicity. Liver: Echogenic, likely fatty infiltration, though this can be seen with cirrhosis and certain infiltrative disorders. No definite focal hepatic mass or nodularity identified though assessment of intrahepatic detail is limited by sound attenuation. Hepatopetal portal venous flow no intrahepatic biliary dilatation. No RIGHT upper quadrant free fluid. IMPRESSION: Probable fatty infiltration of liver as above. Nonvisualization of CBD. Electronically Signed   By: Lavonia Dana M.D.   On: 05/26/2017 16:59     Subjective: Seen and examined at bedside and felt great. Wanted to go home. Denied any complaints and felt almost 100% better.   Discharge Exam: Vitals:   05/27/17 0421 05/27/17 0900  BP: 134/63 (!) 122/51  Pulse: 85 83  Resp: 18   Temp: 98.2 F (36.8 C) 98.2 F (36.8 C)   Vitals:   05/26/17 1958 05/26/17 2140 05/27/17 0421 05/27/17 0900  BP:  139/60 134/63 (!) 122/51  Pulse: 90 88 85 83  Resp: 18 18 18    Temp:  98.3 F (36.8 C) 98.2 F (36.8 C) 98.2 F (36.8 C)  TempSrc:  Oral Oral Oral  SpO2: 97% 99% 100% 99%  Weight:      Height:       General: Pt is alert, awake, not in acute distress Cardiovascular: RRR, S1/S2 +, no rubs, no gallops Respiratory: CTA bilaterally, no wheezing, no rhonchi Abdominal: Soft, NT, ND, bowel sounds + Extremities: no edema, no cyanosis  The results of significant diagnostics from this hospitalization (including imaging, microbiology, ancillary and laboratory) are listed below for reference.    Microbiology: No results found for this or any previous visit (from the past 240 hour(s)).   Labs: BNP (last 3 results) No results for input(s): BNP in the last 8760 hours. Basic Metabolic Panel:  Recent Labs Lab 05/23/17 1402 05/24/17 0840 05/25/17 0528 05/26/17 0527 05/27/17 0517  NA 130* 131* 133* 137 137  K 3.5 3.1* 3.4* 3.7 3.6  CL 104 106 111 116* 116*  CO2 17* 16* 13* 14* 14*  GLUCOSE  155* 190* 146* 106* 122*  BUN 83* 88* 81* 71* 58*  CREATININE 4.31* 4.17* 4.02* 3.59* 3.38*  CALCIUM 8.3* 7.9* 7.8* 8.1* 8.2*  MG  --  2.2  2.2 1.9 1.8  PHOS  --  5.2* 3.9 2.4* 2.9   Liver Function Tests:  Recent Labs Lab 05/23/17 1402 05/24/17 0840 05/25/17 0528 05/26/17 0527 05/27/17 0517  AST 15 13* 27 83* 67*  ALT 24 19 40 106* 135*  ALKPHOS 55 51 61 73 84  BILITOT 0.8 0.6 0.8 0.4 0.8  PROT 6.5 5.7* 5.4* 5.0* 5.2*  ALBUMIN 2.9* 2.5* 2.4* 2.0* 2.3*   No results for input(s): LIPASE, AMYLASE in the last 168 hours. No results for input(s): AMMONIA in the last 168 hours. CBC:  Recent Labs Lab 05/23/17 1402 05/24/17 0803 05/25/17 0528 05/26/17 0527 05/27/17 0517  WBC 0.6* 0.7* 1.5* 3.2* 6.1  NEUTROABS 0.3* 0.0* 0.4* 1.6* 4.5  HGB 15.5 13.4 12.5* 11.7* 12.4*  HCT 42.6 37.7* 35.1* 33.2* 34.1*  MCV 84.9 84.9 83.0 86.0 85.3  PLT 67* 43* 34* 31* 36*   Cardiac Enzymes: No results for input(s): CKTOTAL, CKMB, CKMBINDEX, TROPONINI in the last 168 hours. BNP: Invalid input(s): POCBNP CBG: No results for input(s): GLUCAP in the last 168 hours. D-Dimer No results for input(s): DDIMER in the last 72 hours. Hgb A1c No results for input(s): HGBA1C in the last 72 hours. Lipid Profile No results for input(s): CHOL, HDL, LDLCALC, TRIG, CHOLHDL, LDLDIRECT in the last 72 hours. Thyroid function studies No results for input(s): TSH, T4TOTAL, T3FREE, THYROIDAB in the last 72 hours.  Invalid input(s): FREET3 Anemia work up No results for input(s): VITAMINB12, FOLATE, FERRITIN, TIBC, IRON, RETICCTPCT in the last 72 hours. Urinalysis    Component Value Date/Time   COLORURINE YELLOW 10/15/2015 1201   APPEARANCEUR HAZY (A) 10/15/2015 1201   LABSPEC 1.025 10/15/2015 1201   PHURINE 5.0 10/15/2015 1201   GLUCOSEU NEGATIVE 10/15/2015 1201   HGBUR NEGATIVE 10/15/2015 1201   HGBUR trace-lysed 11/04/2009 [place] 10/15/2015 1201   BILIRUBINUR neg 09/14/2015 1716    KETONESUR NEGATIVE 10/15/2015 1201   PROTEINUR NEGATIVE 10/15/2015 1201   UROBILINOGEN 0.2 10/15/2015 1201   NITRITE NEGATIVE 10/15/2015 1201   LEUKOCYTESUR MODERATE (A) 10/15/2015 1201   Sepsis Labs Invalid input(s): PROCALCITONIN,  WBC,  LACTICIDVEN Microbiology No results found for this or any previous visit (from the past 240 hour(s)).  Time coordinating discharge: 35 minutes  SIGNED:  Kerney Elbe, DO [facility] 05/27/2017, 2:01 PM Pager [phone]  If 7PM-7AM, please contact night-coverage [email or web address] Password [email or web address]

## 2017-05-30 ENCOUNTER — Telehealth: Payer: Self-pay

## 2017-05-30 NOTE — Telephone Encounter (Signed)
LMTCB

## 2017-05-31 ENCOUNTER — Ambulatory Visit: Payer: Medicare Other | Admitting: Family Medicine

## 2017-05-31 NOTE — Telephone Encounter (Signed)
LMTCB

## 2017-06-02 ENCOUNTER — Ambulatory Visit: Payer: Medicare Other | Admitting: Family Medicine

## 2017-06-02 NOTE — Telephone Encounter (Signed)
Noted thanks. Glad he has oncology follow up

## 2017-06-02 NOTE — Telephone Encounter (Signed)
D/C 05/27/17 To: home  Spoke with pt and he states that he is doing well. He does still have a sore throat, which he has had for several weeks. He has had visits from home health for PT. He has no questions or concerns at this time. He does not want to schedule with Dr. Yong Channel as he is following up with [facility] this coming Monday. He reports that they will be completing his lab work.  No TCM appt scheduled.    Transition Care Management Follow-up Telephone Call  How have you been since you were released from the hospital? Good, improving well   Do you understand why you were in the hospital? yes   Do you understand the discharge instrcutions? yes  Items Reviewed:  Medications reviewed: yes  Allergies reviewed: yes  Dietary changes reviewed: yes  Referrals reviewed: yes   Functional Questionnaire:   Activities of Daily Living (ADLs):   He states they are independent in the following: ambulation, bathing and hygiene, feeding, continence, grooming, toileting and dressing States they require assistance with the following: none   Any transportation issues/concerns?: no   Any patient concerns? no   Confirmed importance and date/time of follow-up visits scheduled: yes   Confirmed with patient if condition begins to worsen call PCP or go to the ER.  Patient was given the Call-a-Nurse line [phone]: yes

## 2017-06-05 DIAGNOSIS — Z09 Encounter for follow-up examination after completed treatment for conditions other than malignant neoplasm: Secondary | ICD-10-CM | POA: Diagnosis not present

## 2017-06-05 DIAGNOSIS — N289 Disorder of kidney and ureter, unspecified: Secondary | ICD-10-CM | POA: Diagnosis not present

## 2017-06-05 DIAGNOSIS — R590 Localized enlarged lymph nodes: Secondary | ICD-10-CM | POA: Diagnosis not present

## 2017-06-05 DIAGNOSIS — R740 Nonspecific elevation of levels of transaminase and lactic acid dehydrogenase [LDH]: Secondary | ICD-10-CM | POA: Diagnosis not present

## 2017-06-05 DIAGNOSIS — R5383 Other fatigue: Secondary | ICD-10-CM | POA: Diagnosis not present

## 2017-06-05 DIAGNOSIS — C680 Malignant neoplasm of urethra: Secondary | ICD-10-CM | POA: Diagnosis not present

## 2017-06-12 DIAGNOSIS — R590 Localized enlarged lymph nodes: Secondary | ICD-10-CM | POA: Diagnosis not present

## 2017-06-12 DIAGNOSIS — C680 Malignant neoplasm of urethra: Secondary | ICD-10-CM | POA: Diagnosis not present

## 2017-06-12 DIAGNOSIS — Z09 Encounter for follow-up examination after completed treatment for conditions other than malignant neoplasm: Secondary | ICD-10-CM | POA: Diagnosis not present

## 2017-06-12 DIAGNOSIS — N289 Disorder of kidney and ureter, unspecified: Secondary | ICD-10-CM | POA: Diagnosis not present

## 2017-06-28 DIAGNOSIS — Z7189 Other specified counseling: Secondary | ICD-10-CM | POA: Diagnosis not present

## 2017-06-28 DIAGNOSIS — R590 Localized enlarged lymph nodes: Secondary | ICD-10-CM | POA: Diagnosis not present

## 2017-06-28 DIAGNOSIS — C680 Malignant neoplasm of urethra: Secondary | ICD-10-CM | POA: Diagnosis not present

## 2017-06-28 DIAGNOSIS — C679 Malignant neoplasm of bladder, unspecified: Secondary | ICD-10-CM | POA: Diagnosis not present

## 2017-06-28 DIAGNOSIS — N289 Disorder of kidney and ureter, unspecified: Secondary | ICD-10-CM | POA: Diagnosis not present

## 2017-06-28 DIAGNOSIS — Z09 Encounter for follow-up examination after completed treatment for conditions other than malignant neoplasm: Secondary | ICD-10-CM | POA: Diagnosis not present

## 2017-07-03 DIAGNOSIS — N184 Chronic kidney disease, stage 4 (severe): Secondary | ICD-10-CM | POA: Diagnosis not present

## 2017-07-03 DIAGNOSIS — Z713 Dietary counseling and surveillance: Secondary | ICD-10-CM | POA: Diagnosis not present

## 2017-07-03 DIAGNOSIS — K219 Gastro-esophageal reflux disease without esophagitis: Secondary | ICD-10-CM | POA: Diagnosis not present

## 2017-07-03 DIAGNOSIS — Z01818 Encounter for other preprocedural examination: Secondary | ICD-10-CM | POA: Diagnosis not present

## 2017-07-03 DIAGNOSIS — Z1389 Encounter for screening for other disorder: Secondary | ICD-10-CM | POA: Diagnosis not present

## 2017-07-03 DIAGNOSIS — C669 Malignant neoplasm of unspecified ureter: Secondary | ICD-10-CM | POA: Diagnosis not present

## 2017-07-03 DIAGNOSIS — G4733 Obstructive sleep apnea (adult) (pediatric): Secondary | ICD-10-CM | POA: Diagnosis not present

## 2017-07-03 DIAGNOSIS — Z9989 Dependence on other enabling machines and devices: Secondary | ICD-10-CM | POA: Diagnosis not present

## 2017-07-03 DIAGNOSIS — R94120 Abnormal auditory function study: Secondary | ICD-10-CM | POA: Diagnosis not present

## 2017-07-03 DIAGNOSIS — E669 Obesity, unspecified: Secondary | ICD-10-CM | POA: Diagnosis not present

## 2017-07-03 DIAGNOSIS — Z9189 Other specified personal risk factors, not elsewhere classified: Secondary | ICD-10-CM | POA: Diagnosis not present

## 2017-07-03 DIAGNOSIS — I10 Essential (primary) hypertension: Secondary | ICD-10-CM | POA: Diagnosis not present

## 2017-07-06 DIAGNOSIS — C774 Secondary and unspecified malignant neoplasm of inguinal and lower limb lymph nodes: Secondary | ICD-10-CM | POA: Diagnosis present

## 2017-07-06 DIAGNOSIS — G8918 Other acute postprocedural pain: Secondary | ICD-10-CM | POA: Diagnosis not present

## 2017-07-06 DIAGNOSIS — G4733 Obstructive sleep apnea (adult) (pediatric): Secondary | ICD-10-CM | POA: Diagnosis present

## 2017-07-06 DIAGNOSIS — Z8559 Personal history of malignant neoplasm of other urinary tract organ: Secondary | ICD-10-CM | POA: Diagnosis not present

## 2017-07-06 DIAGNOSIS — C609 Malignant neoplasm of penis, unspecified: Secondary | ICD-10-CM | POA: Diagnosis not present

## 2017-07-06 DIAGNOSIS — E7800 Pure hypercholesterolemia, unspecified: Secondary | ICD-10-CM | POA: Diagnosis present

## 2017-07-06 DIAGNOSIS — I10 Essential (primary) hypertension: Secondary | ICD-10-CM | POA: Diagnosis present

## 2017-07-06 DIAGNOSIS — E669 Obesity, unspecified: Secondary | ICD-10-CM | POA: Diagnosis present

## 2017-07-06 DIAGNOSIS — I9589 Other hypotension: Secondary | ICD-10-CM | POA: Diagnosis not present

## 2017-07-06 DIAGNOSIS — C680 Malignant neoplasm of urethra: Secondary | ICD-10-CM | POA: Diagnosis not present

## 2017-07-06 DIAGNOSIS — N179 Acute kidney failure, unspecified: Secondary | ICD-10-CM | POA: Diagnosis not present

## 2017-07-06 DIAGNOSIS — C775 Secondary and unspecified malignant neoplasm of intrapelvic lymph nodes: Secondary | ICD-10-CM | POA: Diagnosis not present

## 2017-07-06 DIAGNOSIS — Z6831 Body mass index (BMI) 31.0-31.9, adult: Secondary | ICD-10-CM | POA: Diagnosis not present

## 2017-07-06 DIAGNOSIS — I951 Orthostatic hypotension: Secondary | ICD-10-CM | POA: Diagnosis not present

## 2017-07-06 DIAGNOSIS — K5900 Constipation, unspecified: Secondary | ICD-10-CM | POA: Diagnosis not present

## 2017-07-06 DIAGNOSIS — Z87891 Personal history of nicotine dependence: Secondary | ICD-10-CM | POA: Diagnosis not present

## 2017-07-06 HISTORY — PX: PELVIC LYMPH NODE DISSECTION: SHX6543

## 2017-07-14 DIAGNOSIS — C609 Malignant neoplasm of penis, unspecified: Secondary | ICD-10-CM | POA: Diagnosis not present

## 2017-07-20 DIAGNOSIS — C680 Malignant neoplasm of urethra: Secondary | ICD-10-CM | POA: Diagnosis not present

## 2017-07-20 DIAGNOSIS — C609 Malignant neoplasm of penis, unspecified: Secondary | ICD-10-CM | POA: Diagnosis not present

## 2017-07-28 DIAGNOSIS — C680 Malignant neoplasm of urethra: Secondary | ICD-10-CM | POA: Diagnosis not present

## 2017-09-12 DIAGNOSIS — Z23 Encounter for immunization: Secondary | ICD-10-CM | POA: Diagnosis not present

## 2017-10-13 DIAGNOSIS — C609 Malignant neoplasm of penis, unspecified: Secondary | ICD-10-CM | POA: Diagnosis not present

## 2017-10-13 DIAGNOSIS — Z08 Encounter for follow-up examination after completed treatment for malignant neoplasm: Secondary | ICD-10-CM | POA: Diagnosis not present

## 2017-10-13 DIAGNOSIS — Z87891 Personal history of nicotine dependence: Secondary | ICD-10-CM | POA: Diagnosis not present

## 2017-10-13 DIAGNOSIS — Z8554 Personal history of malignant neoplasm of ureter: Secondary | ICD-10-CM | POA: Diagnosis not present

## 2017-10-13 DIAGNOSIS — J984 Other disorders of lung: Secondary | ICD-10-CM | POA: Diagnosis not present

## 2017-10-30 ENCOUNTER — Ambulatory Visit (INDEPENDENT_AMBULATORY_CARE_PROVIDER_SITE_OTHER): Payer: Medicare Other | Admitting: Family Medicine

## 2017-10-30 ENCOUNTER — Encounter: Payer: Self-pay | Admitting: Family Medicine

## 2017-10-30 VITALS — BP 110/68 | HR 78 | Temp 97.6°F | Ht 75.0 in | Wt 255.8 lb

## 2017-10-30 DIAGNOSIS — E119 Type 2 diabetes mellitus without complications: Secondary | ICD-10-CM

## 2017-10-30 DIAGNOSIS — E785 Hyperlipidemia, unspecified: Secondary | ICD-10-CM | POA: Diagnosis not present

## 2017-10-30 DIAGNOSIS — I10 Essential (primary) hypertension: Secondary | ICD-10-CM

## 2017-10-30 DIAGNOSIS — R1310 Dysphagia, unspecified: Secondary | ICD-10-CM

## 2017-10-30 LAB — CBC
HEMATOCRIT: 42.7 % (ref 39.0–52.0)
HEMOGLOBIN: 14 g/dL (ref 13.0–17.0)
MCHC: 32.8 g/dL (ref 30.0–36.0)
MCV: 93.9 fl (ref 78.0–100.0)
Platelets: 230 10*3/uL (ref 150.0–400.0)
RBC: 4.54 Mil/uL (ref 4.22–5.81)
RDW: 13.9 % (ref 11.5–15.5)
WBC: 5.4 10*3/uL (ref 4.0–10.5)

## 2017-10-30 LAB — COMPREHENSIVE METABOLIC PANEL
ALBUMIN: 3.8 g/dL (ref 3.5–5.2)
ALK PHOS: 75 U/L (ref 39–117)
ALT: 18 U/L (ref 0–53)
AST: 17 U/L (ref 0–37)
BILIRUBIN TOTAL: 0.5 mg/dL (ref 0.2–1.2)
BUN: 26 mg/dL — ABNORMAL HIGH (ref 6–23)
CO2: 25 mEq/L (ref 19–32)
Calcium: 9.7 mg/dL (ref 8.4–10.5)
Chloride: 108 mEq/L (ref 96–112)
Creatinine, Ser: 1.94 mg/dL — ABNORMAL HIGH (ref 0.40–1.50)
GFR: 35.48 mL/min — AB (ref >60.00)
GLUCOSE: 109 mg/dL — AB (ref 70–99)
POTASSIUM: 5.3 meq/L — AB (ref 3.5–5.1)
Sodium: 140 mEq/L (ref 135–145)
TOTAL PROTEIN: 6.7 g/dL (ref 6.0–8.3)

## 2017-10-30 LAB — LIPID PANEL
CHOLESTEROL: 180 mg/dL (ref 0–200)
HDL: 50.9 mg/dL (ref >39.00)
LDL Cholesterol: 111 mg/dL — ABNORMAL HIGH (ref 0–99)
NONHDL: 129.36
TRIGLYCERIDES: 91 mg/dL (ref 0.0–149.0)
Total CHOL/HDL Ratio: 4
VLDL: 18.2 mg/dL (ref 0.0–40.0)

## 2017-10-30 LAB — HEMOGLOBIN A1C: Hgb A1c MFr Bld: 6.1 % (ref 4.6–6.5)

## 2017-10-30 NOTE — Assessment & Plan Note (Signed)
S: reasonable controlled on atorvastatin 40mg  given age. No myalgias.  Lab Results  Component Value Date   CHOL 181 05/05/2014   HDL 49.90 05/05/2014   LDLCALC 119 (H) 05/05/2014   LDLDIRECT 105.0 11/28/2016   TRIG 60.0 05/05/2014   CHOLHDL 4 05/05/2014   A/P: update lipids- likely no changes as long as LDL <130

## 2017-10-30 NOTE — Assessment & Plan Note (Signed)
S: controlled on amlodipine 5mg .has not been on ace-I. GFR stable in 30s or 40s. Knows to avoid nsaids. .  BP Readings from Last 3 Encounters:  10/30/17 110/68  05/27/17 (!) 122/51  11/28/16 118/60  A/P: We discussed blood pressure goal of <140/90. Continue current meds. Update bmet as well

## 2017-10-30 NOTE — Progress Notes (Signed)
Subjective:  Charles Castillo. is a 83 y.o. year old very pleasant male patient who presents for/with See problem oriented charting ROS- some fatigue. No significant edema, some numbness upper thighs. No chest pain. No recent gout flare   Past Medical History-  Patient Active Problem List   Diagnosis Date Noted  . Diabetes mellitus ([place]) 09/15/2014    Priority: High  . Dysphagia 10/30/2017    Priority: Medium  . Erectile dysfunction 09/15/2014    Priority: Medium  . Urethral carcinoma ([place]) 11/04/2009    Priority: Medium  . CKD (chronic kidney disease), stage III ([place]) 03/24/2008    Priority: Medium  . Hyperlipidemia 08/27/2007    Priority: Medium  . Essential hypertension 07/03/2007    Priority: Medium  . Sleep apnea 07/03/2007    Priority: Medium  . Former smoker 09/15/2014    Priority: Low  . Obesity 10/10/2012    Priority: Low  . Osteoarthritis 07/03/2007    Priority: Low  . Hypokalemia 05/24/2017  . Hyponatremia 05/24/2017  . Syncope 05/23/2017  . Acute renal failure superimposed on stage 3 chronic kidney disease ([place]) 05/23/2017  . Gout 05/24/2016    Medications- reviewed and updated Current Outpatient Medications  Medication Sig Dispense Refill  . amLODipine (NORVASC) 5 MG tablet Take 1 tablet (5 mg total) by mouth daily. 90 tablet 2  . aspirin EC 81 MG tablet Take 81 mg by mouth daily.    Marland Kitchen atorvastatin (LIPITOR) 40 MG tablet Take 1 tablet (40 mg total) by mouth daily. 90 tablet 3  . DiphenhydrAMINE HCl, Sleep, (SOMINEX PO) Take 1 tablet by mouth at bedtime.     . senna (SENOKOT) 8.6 MG TABS tablet Take 2 tablets (17.2 mg total) by mouth at bedtime. 120 each 0   No current facility-administered medications for this visit.     Objective: BP 110/68 (BP Location: Left Arm, Patient Position: Sitting, Cuff Size: Large)   Pulse 78   Temp 97.6 F (36.4 C) (Oral)   Ht 6\' 3"  (1.905 m)   Wt 255 lb 12.8 oz (116 kg)   SpO2 97%   BMI 31.97 kg/m  Gen: NAD,  resting comfortably CV: RRR no murmurs rubs or gallops Lungs: CTAB no crackles, wheeze, rhonchi Abdomen: soft/nontender/nondistended/normal bowel sounds.obese Ext: trace bilateral edema Skin: warm, dry  Assessment/Plan:  No gout flares in last 4-5 months- has prn treatment- colchicine. Not on uric acid lowering agent.   Dysphagia S: some trouble swallowing with thick/dense foods. Feels like things get stuck- cant drink liquids after food gets stuck. Lifts arms above head and eventually gets better A/P: Refer to GI for potential endoscopy. Given this happened in past outside of cancer and has had reassuring scans through [facility]- doubt malignancy related  Hyperlipidemia S: reasonable controlled on atorvastatin 40mg  given age. No myalgias.  Lab Results  Component Value Date   CHOL 181 05/05/2014   HDL 49.90 05/05/2014   LDLCALC 119 (H) 05/05/2014   LDLDIRECT 105.0 11/28/2016   TRIG 60.0 05/05/2014   CHOLHDL 4 05/05/2014   A/P: update lipids- likely no changes as long as LDL <130  Essential hypertension S: controlled on amlodipine 5mg .has not been on ace-I. GFR stable in 30s or 40s. Knows to avoid nsaids. .  BP Readings from Last 3 Encounters:  10/30/17 110/68  05/27/17 (!) 122/51  11/28/16 118/60  A/P: We discussed blood pressure goal of <140/90. Continue current meds. Update bmet as well  Diabetes mellitus ([place]) S: previously controlled. On no  medication. Does not check CBGs. Has gotten weight down 17 lbs from last year!  Wt Readings from Last 3 Encounters:  10/30/17 255 lb 12.8 oz (116 kg)  05/23/17 269 lb 13.5 oz (122.4 kg)  11/28/16 282 lb 3.2 oz (128 kg)   Lab Results  Component Value Date   HGBA1C 6.3 11/28/2016   HGBA1C 6.1 05/24/2016   HGBA1C 6.4 11/18/2015   A/P: update a1c today.  -has eye appointment tomorrow- will have them send [place] records -very challenging to give urine and with cancer treatment hold off on urine microalbumin/cr ratio  Back to 6 month  scheduling now as long as doing better from cancer perspective  Orders Placed This Encounter  Procedures  . CBC    [place]  . Comprehensive metabolic panel    [place]    Order Specific Question:   Has the patient fasted?    Answer:   No  . Lipid panel    [place]    Order Specific Question:   Has the patient fasted?    Answer:   No  . Hemoglobin A1c    [place]  . Ambulatory referral to Gastroenterology    Referral Priority:   Routine    Referral Type:   Consultation    Referral Reason:   Specialty Services Required    Number of Visits Requested:   1   Return precautions advised.  Garret Reddish, MD

## 2017-10-30 NOTE — Assessment & Plan Note (Signed)
S: some trouble swallowing with thick/dense foods. Feels like things get stuck- cant drink liquids after food gets stuck. Lifts arms above head and eventually gets better A/P: Refer to GI for potential endoscopy. Given this happened in past outside of cancer and has had reassuring scans through [facility]- doubt malignancy related

## 2017-10-30 NOTE — Assessment & Plan Note (Signed)
S: previously controlled. On no medication. Does not check CBGs. Has gotten weight down 17 lbs from last year!  Wt Readings from Last 3 Encounters:  10/30/17 255 lb 12.8 oz (116 kg)  05/23/17 269 lb 13.5 oz (122.4 kg)  11/28/16 282 lb 3.2 oz (128 kg)   Lab Results  Component Value Date   HGBA1C 6.3 11/28/2016   HGBA1C 6.1 05/24/2016   HGBA1C 6.4 11/18/2015   A/P: update a1c today.  -has eye appointment tomorrow- will have them send [place] records -very challenging to give urine and with cancer treatment hold off on urine microalbumin/cr ratio

## 2017-10-30 NOTE — Patient Instructions (Signed)
No changes today  We will call you within a week or two about your referral to GI for potential scope of esophagus due to swallowing issues. If you do not hear within 3 weeks, give [place] a call.   Thanks for having eye doctor send [place] records  Please stop by lab before you go

## 2017-10-31 DIAGNOSIS — E119 Type 2 diabetes mellitus without complications: Secondary | ICD-10-CM | POA: Diagnosis not present

## 2017-10-31 LAB — HM DIABETES EYE EXAM

## 2017-11-01 ENCOUNTER — Encounter: Payer: Self-pay | Admitting: Family Medicine

## 2017-11-16 ENCOUNTER — Telehealth: Payer: Self-pay | Admitting: Family Medicine

## 2017-11-16 ENCOUNTER — Encounter: Payer: Self-pay | Admitting: Physician Assistant

## 2017-11-16 NOTE — Telephone Encounter (Signed)
Called patient and he already had the telephone number that we had provided so I encouraged him to call the office and schedule an appointment. He was calling them.

## 2017-11-16 NOTE — Telephone Encounter (Unsigned)
Copied from [place]. Topic: General - Other >> Nov 16, 2017 11:00 AM Neva Seat wrote: Pt called to say [facility] [address] hasn't called pt.  He called to let you know he hasn't heard from them.

## 2017-11-16 NOTE — Telephone Encounter (Signed)
Per referral note:  Type Date User Summary Attachment  General 10/30/2017 10:47 AM Irven [place] - -  Note   Left message for patient to return my call. Former Dr. Sharlett Iles patient         I will call patient and provide with telephone number [phone] for patient to call and schedule an appointment.

## 2017-11-27 ENCOUNTER — Other Ambulatory Visit: Payer: Self-pay | Admitting: Family Medicine

## 2017-12-06 DIAGNOSIS — Z8549 Personal history of malignant neoplasm of other male genital organs: Secondary | ICD-10-CM | POA: Diagnosis not present

## 2017-12-06 DIAGNOSIS — C775 Secondary and unspecified malignant neoplasm of intrapelvic lymph nodes: Secondary | ICD-10-CM | POA: Diagnosis not present

## 2017-12-06 DIAGNOSIS — R590 Localized enlarged lymph nodes: Secondary | ICD-10-CM | POA: Diagnosis not present

## 2017-12-06 DIAGNOSIS — Z87891 Personal history of nicotine dependence: Secondary | ICD-10-CM | POA: Diagnosis not present

## 2017-12-06 DIAGNOSIS — I898 Other specified noninfective disorders of lymphatic vessels and lymph nodes: Secondary | ICD-10-CM | POA: Diagnosis not present

## 2017-12-06 DIAGNOSIS — Z807 Family history of other malignant neoplasms of lymphoid, hematopoietic and related tissues: Secondary | ICD-10-CM | POA: Diagnosis not present

## 2017-12-06 DIAGNOSIS — Z8042 Family history of malignant neoplasm of prostate: Secondary | ICD-10-CM | POA: Diagnosis not present

## 2017-12-06 DIAGNOSIS — Z79899 Other long term (current) drug therapy: Secondary | ICD-10-CM | POA: Diagnosis not present

## 2017-12-06 DIAGNOSIS — R1909 Other intra-abdominal and pelvic swelling, mass and lump: Secondary | ICD-10-CM | POA: Diagnosis not present

## 2017-12-06 DIAGNOSIS — C774 Secondary and unspecified malignant neoplasm of inguinal and lower limb lymph nodes: Secondary | ICD-10-CM | POA: Diagnosis not present

## 2017-12-06 DIAGNOSIS — C680 Malignant neoplasm of urethra: Secondary | ICD-10-CM | POA: Diagnosis not present

## 2017-12-08 ENCOUNTER — Ambulatory Visit: Payer: Medicare Other | Admitting: Physician Assistant

## 2017-12-27 ENCOUNTER — Ambulatory Visit (INDEPENDENT_AMBULATORY_CARE_PROVIDER_SITE_OTHER): Payer: Medicare Other | Admitting: Physician Assistant

## 2017-12-27 ENCOUNTER — Encounter: Payer: Self-pay | Admitting: Physician Assistant

## 2017-12-27 VITALS — BP 112/62 | HR 74 | Ht 74.0 in | Wt 259.0 lb

## 2017-12-27 DIAGNOSIS — K222 Esophageal obstruction: Secondary | ICD-10-CM | POA: Diagnosis not present

## 2017-12-27 DIAGNOSIS — R1319 Other dysphagia: Secondary | ICD-10-CM

## 2017-12-27 NOTE — Progress Notes (Addendum)
Subjective:    Patient ID: Charles Castillo., male    DOB: 1933/12/26, 84 y.o.   MRN: [id number]  HPI Charles Castillo is a pleasant 84 year old white male, referred today by Charles Castillo for evaluation of recurrent solid food dysphagia. Patient is known remotely to Charles Castillo, and underwent upper endoscopy in 2007 with finding of a distal esophageal ring with a 15 mm lumen. This was Charles Castillo dilated to #60 [place], also had a small hiatal hernia. Patient says he had good relief of his dysphagia after that procedure and did not have any problems until over the past year. He says he started having problems again with very dense meats and has had to alter his diet. Over the past few months symptoms have progressed to the point that he is having symptoms on a regular basis. He says his premeds are voiding beef and pork but is able to eat hamburger without difficulty. He will have episodes of food lodging which require him to stop eating, sometimes raise his arms above his head and stretches neck back and then eventually food will go on down. He is not regurgitating. Generally not having any difficulty with liquids or soft foods. He has no complaints of heartburn or indigestion. Appetite is been fine and weight has been stable. No changes with his bowels melena or hematochezia. He would like to have another endoscopy with dilation. Other medical problems includes hypertension, obstructive sleep apnea with CPAP use, no 02, adult-onset diabetes mellitus, chronic kidney disease and history of urethral cancer for which he has had surgery and most recently had a lymphadenectomy bilateral groins about 6 months ago at [facility].  Review of Systems Pertinent positive and negative review of systems were noted in the above HPI section.  All other review of systems was otherwise negative.  Outpatient Encounter Medications as of 12/27/2017  Medication Sig  . amLODipine (NORVASC) 5 MG tablet Take 1 tablet by mouth every day    . aspirin EC 81 MG tablet Take 81 mg by mouth daily.  Marland Kitchen atorvastatin (LIPITOR) 40 MG tablet Take 1 tablet (40 mg total) by mouth daily.  . DiphenhydrAMINE HCl, Sleep, (SOMINEX PO) Take 1 tablet by mouth at bedtime.   . senna (SENOKOT) 8.6 MG TABS tablet Take 2 tablets (17.2 mg total) by mouth at bedtime.   No facility-administered encounter medications on file as of 12/27/2017.    No Known Allergies Patient Active Problem List   Diagnosis Date Noted  . Dysphagia 10/30/2017  . Hypokalemia 05/24/2017  . Hyponatremia 05/24/2017  . Syncope 05/23/2017  . Acute renal failure superimposed on stage 3 chronic kidney disease ([place]) 05/23/2017  . Gout 05/24/2016  . Erectile dysfunction 09/15/2014  . Diabetes mellitus ([place]) 09/15/2014  . Former smoker 09/15/2014  . Obesity 10/10/2012  . Urethral carcinoma ([place]) 11/04/2009  . CKD (chronic kidney disease), stage III ([place]) 03/24/2008  . Hyperlipidemia 08/27/2007  . Essential hypertension 07/03/2007  . Osteoarthritis 07/03/2007  . Sleep apnea 07/03/2007   Social History   Socioeconomic History  . Marital status: Married    Spouse name: Not on file  . Number of children: Not on file  . Years of education: Not on file  . Highest education level: Not on file  Social Needs  . Financial resource strain: Not on file  . Food insecurity - worry: Not on file  . Food insecurity - inability: Not on file  . Transportation needs - medical: Not on file  . Transportation  needs - non-medical: Not on file  Occupational History  . Not on file  Tobacco Use  . Smoking status: Former Smoker    Packs/day: 1.00    Years: 25.00    Pack years: 25.00    Types: Cigarettes    Last attempt to quit: 12/19/1968    Years since quitting: 49.0  . Smokeless tobacco: Never Used  Substance and Sexual Activity  . Alcohol use: No    Alcohol/week: 0.0 oz  . Drug use: No  . Sexual activity: Not Currently  Other Topics Concern  . Not on file  Social History Narrative    Married 1999. 5 kids from previous marriage. 2 step kids. 19 grandkids. 9 greatgrandchildren.   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       Wife-hcpoa and this document details wishes. Patient does not want long term life preserving measures but would want resuscitation and intubation if needed.     Charles Castillo family history includes Heart failure in his father.      Objective:    Vitals:   12/27/17 1029  BP: 112/62  Pulse: 74    Physical Exam; well-developed elderly white male in no acute distress, very pleasant accompanied by his wife blood pressure 112/62 pulse 74, height 6 foot 2, BMI 33.2. HEENT; nontraumatic normocephalic EOMI PERRLA sclera anicteric, Cardiovascular; regular rate and rhythm with S1-S2 no murmur or gallop, Pulmonary ;clear bilaterally, Abdomen ;soft nontender nondistended bowel sounds are active there is no palpable mass or hepatosplenomegaly bowel sounds present, Rectal ;exam not done, Ext;  no clubbing cyanosis or edema skin warm and dry, Neuropsych ;mood and affect appropriate       Assessment & Plan:   #24 84 year old white male with previous history of distal esophageal ring requiring esophageal dilation 2007 with recurrent dysphagia primarily to meats gradually progressive over the past several months. Patient has no heartburn or reflux symptoms. Suspect may have recurrent distal esophageal ring or possible distal esophageal stricture. #2 Obstructive sleep apnea-no 02 #3 adult-onset diabetes mellitus #4 chronic kidney disease #5 urethral cancer  Plan; Patient will be scheduled for upper endoscopy with probable esophageal dilation with Charles Castillo. Procedure was discussed in detail with patient including indications risks and benefits and he is agreeable to proceed. Further plans pending results of  above.  Charles Castillo Charles Harold PA-C 12/27/2017   Cc: Charles Olp, MD  Addendum: Reviewed and agree with initial management. Charles Castillo, Charles Lines, MD

## 2017-12-27 NOTE — Patient Instructions (Signed)
If you are age 84 or older, your body mass index should be between 23-30. Your Body mass index is 33.25 kg/m. If this is out of the aforementioned range listed, please consider follow up with your Primary Care Provider.  You have been scheduled for an endoscopy. Please follow written instructions given to you at your visit today. If you use inhalers (even only as needed), please bring them with you on the day of your procedure. Your physician has requested that you go to www.startemmi.com and enter the access code given to you at your visit today. This web site gives a general overview about your procedure. However, you should still follow specific instructions given to you by our office regarding your preparation for the procedure.

## 2017-12-28 ENCOUNTER — Ambulatory Visit (AMBULATORY_SURGERY_CENTER): Payer: Medicare Other | Admitting: Internal Medicine

## 2017-12-28 ENCOUNTER — Encounter: Payer: Self-pay | Admitting: Internal Medicine

## 2017-12-28 VITALS — BP 121/79 | HR 60 | Temp 97.3°F | Resp 11 | Ht 74.0 in | Wt 259.0 lb

## 2017-12-28 DIAGNOSIS — K317 Polyp of stomach and duodenum: Secondary | ICD-10-CM | POA: Diagnosis not present

## 2017-12-28 DIAGNOSIS — R1319 Other dysphagia: Secondary | ICD-10-CM | POA: Diagnosis not present

## 2017-12-28 DIAGNOSIS — K2980 Duodenitis without bleeding: Secondary | ICD-10-CM | POA: Diagnosis not present

## 2017-12-28 DIAGNOSIS — K208 Other esophagitis: Secondary | ICD-10-CM | POA: Diagnosis not present

## 2017-12-28 DIAGNOSIS — K2210 Ulcer of esophagus without bleeding: Secondary | ICD-10-CM | POA: Diagnosis not present

## 2017-12-28 DIAGNOSIS — G4733 Obstructive sleep apnea (adult) (pediatric): Secondary | ICD-10-CM | POA: Diagnosis not present

## 2017-12-28 DIAGNOSIS — I10 Essential (primary) hypertension: Secondary | ICD-10-CM | POA: Diagnosis not present

## 2017-12-28 DIAGNOSIS — R1310 Dysphagia, unspecified: Secondary | ICD-10-CM | POA: Diagnosis not present

## 2017-12-28 MED ORDER — PANTOPRAZOLE SODIUM 40 MG PO TBEC
40.0000 mg | DELAYED_RELEASE_TABLET | Freq: Every day | ORAL | 1 refills | Status: DC
Start: 2017-12-28 — End: 2017-12-28

## 2017-12-28 MED ORDER — SODIUM CHLORIDE 0.9 % IV SOLN: 500.0000 mL | Freq: Once | INTRAVENOUS | Status: DC

## 2017-12-28 MED ORDER — PANTOPRAZOLE SODIUM 40 MG PO TBEC
40.0000 mg | DELAYED_RELEASE_TABLET | Freq: Two times a day (BID) | ORAL | 1 refills | Status: DC
Start: 2017-12-28 — End: 2018-02-21

## 2017-12-28 NOTE — Progress Notes (Signed)
Pt's states no medical or surgical changes since previsit or office visit. 

## 2017-12-28 NOTE — Progress Notes (Signed)
Called to room to assist during endoscopic procedure.  Patient ID and intended procedure confirmed with present staff. Received instructions for my participation in the procedure from the performing physician.  

## 2017-12-28 NOTE — Addendum Note (Signed)
Addended by: Ernestine Conrad D on: 12/28/2017 05:09 PM   Modules accepted: Orders

## 2017-12-28 NOTE — Op Note (Signed)
[place] Patient Name: Charles Castillo Procedure Date: 12/28/2017 2:15 PM MRN: [id number] Endoscopist: Jerene Bears , MD Age: 84 Referring MD:  Date of Birth: [birth year] Gender: Male Account #: [id number] Procedure:                Upper GI endoscopy Indications:              Dysphagia Medicines:                Monitored Anesthesia Care Procedure:                Pre-Anesthesia Assessment:                           - Prior to the procedure, a History and Physical                            was performed, and patient medications and                            allergies were reviewed. The patient's tolerance of                            previous anesthesia was also reviewed. The risks                            and benefits of the procedure and the sedation                            options and risks were discussed with the patient.                            All questions were answered, and informed consent                            was obtained. Prior Anticoagulants: The patient has                            taken no previous anticoagulant or antiplatelet                            agents. ASA Grade Assessment: III - A patient with                            severe systemic disease. After reviewing the risks                            and benefits, the patient was deemed in                            satisfactory condition to undergo the procedure.                           After obtaining informed consent, the endoscope was  passed under direct vision. Throughout the                            procedure, the patient's blood pressure, pulse, and                            oxygen saturations were monitored continuously. The                            Model GIF-HQ190 [id number]) scope was introduced                            through the mouth, and advanced to the second part                            of duodenum. The upper GI endoscopy was                          accomplished without difficulty. The patient                            tolerated the procedure well. Scope In: Scope Out: Findings:                 One superficial esophageal ulcer with no bleeding                            was found in the lower third of the esophagus. The                            lesion was 8 mm in largest dimension. Biopsies were                            taken with a cold forceps for histology.                           LA Grade D (one or more mucosal breaks involving at                            least 75% of esophageal circumference) ulcerative                            esophagitis was found in the lower third of the                            esophagus. Biopsies were taken with a cold forceps                            for histology.                           A 4 cm hiatal hernia was present.  Nodular mucosa was found in the cardia. Biopsies                            were taken with a cold forceps for histology.                           A few small sessile polyps were found in the                            gastric fundus and in the gastric body. Benign in                            appearance.                           Diffuse severe inflammation characterized by                            congestion (edema), erythema and granularity was                            found in the duodenal bulb.                           The second portion of the duodenum was normal. Complications:            No immediate complications. Estimated Blood Loss:     Estimated blood loss was minimal. Impression:               - Non-bleeding esophageal ulcer (likely related to                            reflux). Biopsied.                           - LA Grade D reflux esophagitis. Biopsied.                           - 4 cm hiatal hernia.                           - Nodular mucosa in the cardia. Biopsied.                           -  Duodenitis.                           - Normal second portion of the duodenum. Recommendation:           - Patient has a contact number available for                            emergencies. The signs and symptoms of potential                            delayed complications were discussed with the  patient. Return to normal activities tomorrow.                            Written discharge instructions were provided to the                            patient.                           - Soft diet.                           - Continue present medications.                           - Add pantoprazole 40 mg twice daily before meals.                           - Await pathology results.                           - Repeat upper endoscopy in 8 weeks to evaluate the                            response to therapy. Jerene Bears, MD 12/28/2017 2:48:54 PM This report has been signed electronically.

## 2017-12-28 NOTE — Progress Notes (Signed)
Report to PACU, RN, vss, BBS= Clear.  

## 2017-12-28 NOTE — Patient Instructions (Addendum)
YOU HAD AN ENDOSCOPIC PROCEDURE TODAY AT [facility]:   Refer to the procedure report that was given to you for any specific questions about what was found during the examination.  If the procedure report does not answer your questions, please call your gastroenterologist to clarify.  If you requested that your care partner not be given the details of your procedure findings, then the procedure report has been included in a sealed envelope for you to review at your convenience later.  YOU SHOULD EXPECT: Some feelings of bloating in the abdomen. Passage of more gas than usual.  Walking can help get rid of the air that was put into your GI tract during the procedure and reduce the bloating.  Please Note:  You might notice some irritation and congestion in your nose or some drainage.  This is from the oxygen used during your procedure.  There is no need for concern and it should clear up in a day or so.  SYMPTOMS TO REPORT IMMEDIATELY:    Following upper endoscopy (EGD)  Vomiting of blood or coffee ground material  New chest pain or pain under the shoulder blades  Painful or persistently difficult swallowing  New shortness of breath  Fever of 100F or higher  Black, tarry-looking stools  For urgent or emergent issues, a gastroenterologist can be reached at any hour by calling [phone].   DIET:  We do recommend a small meal at first, but then you may proceed to your regular diet.  Drink plenty of fluids but you should avoid alcoholic beverages for 24 hours. Stay on soft diet until able to eat regular diet.   Try to decrease the acidic foods and drinks to decrease the acid in your stomach and esophagus. Take your medication as ordered.   ACTIVITY:  You should plan to take it easy for the rest of today and you should NOT DRIVE or use heavy machinery until tomorrow (because of the sedation medicines used during the test).    FOLLOW UP: Our staff will call the number listed  on your records the next business day following your procedure to check on you and address any questions or concerns that you may have regarding the information given to you following your procedure. If we do not reach you, we will leave a message.  However, if you are feeling well and you are not experiencing any problems, there is no need to return our call.  We will assume that you have returned to your regular daily activities without incident.  If any biopsies were taken you will be contacted by phone or by letter within the next 1-3 weeks.  Please call us at [phone] if you have not heard about the biopsies in 3 weeks.    SIGNATURES/CONFIDENTIALITY: You and/or your care partner have signed paperwork which will be entered into your electronic medical record.  These signatures attest to the fact that that the information above on your After Visit Summary has been reviewed and is understood.  Full responsibility of the confidentiality of this discharge information lies with you and/or your care-partner.  Read all of the handouts given to you by your recovery room nurse.  Take your new medication as ordered. Take 1/2 hour before breakfast and dinner.

## 2017-12-29 ENCOUNTER — Telehealth: Payer: Self-pay

## 2017-12-29 NOTE — Telephone Encounter (Signed)
  Follow up Call-  Call back number 12/28/2017  Post procedure Call Back phone  # [phone]  Permission to leave phone message Yes  Some recent data might be hidden     Patient questions:  Do you have a fever, pain , or abdominal swelling? No. Pain Score  0 *  Have you tolerated food without any problems? Yes.    Have you been able to return to your normal activities? Yes.    Do you have any questions about your discharge instructions: Diet   No. Medications  No. Follow up visit  No.  Do you have questions or concerns about your Care? No.  Actions: * If pain score is 4 or above: No action needed, pain <4.

## 2018-01-03 ENCOUNTER — Encounter: Payer: Self-pay | Admitting: Internal Medicine

## 2018-01-10 DIAGNOSIS — I898 Other specified noninfective disorders of lymphatic vessels and lymph nodes: Secondary | ICD-10-CM | POA: Diagnosis not present

## 2018-01-10 DIAGNOSIS — R590 Localized enlarged lymph nodes: Secondary | ICD-10-CM | POA: Diagnosis not present

## 2018-01-10 DIAGNOSIS — C680 Malignant neoplasm of urethra: Secondary | ICD-10-CM | POA: Diagnosis not present

## 2018-01-12 ENCOUNTER — Other Ambulatory Visit: Payer: Self-pay | Admitting: Family Medicine

## 2018-02-14 ENCOUNTER — Ambulatory Visit (AMBULATORY_SURGERY_CENTER): Payer: Self-pay | Admitting: *Deleted

## 2018-02-14 ENCOUNTER — Other Ambulatory Visit: Payer: Self-pay

## 2018-02-14 VITALS — Ht 74.0 in | Wt 270.0 lb

## 2018-02-14 DIAGNOSIS — K2210 Ulcer of esophagus without bleeding: Secondary | ICD-10-CM

## 2018-02-14 DIAGNOSIS — R1319 Other dysphagia: Secondary | ICD-10-CM

## 2018-02-14 NOTE — Progress Notes (Signed)
Patient denies any allergies to eggs or soy. Patient denies any problems with anesthesia/sedation. Patient denies any oxygen use at home. Patient denies taking any diet/weight loss medications or blood thinners. EMMI education declined by pt.  

## 2018-02-21 ENCOUNTER — Encounter: Payer: Self-pay | Admitting: Internal Medicine

## 2018-02-21 ENCOUNTER — Ambulatory Visit (AMBULATORY_SURGERY_CENTER): Payer: Medicare Other | Admitting: Internal Medicine

## 2018-02-21 VITALS — BP 120/66 | HR 66 | Temp 96.6°F | Resp 19 | Ht 74.0 in | Wt 270.0 lb

## 2018-02-21 DIAGNOSIS — K209 Esophagitis, unspecified without bleeding: Secondary | ICD-10-CM

## 2018-02-21 DIAGNOSIS — K2980 Duodenitis without bleeding: Secondary | ICD-10-CM

## 2018-02-21 DIAGNOSIS — R1310 Dysphagia, unspecified: Secondary | ICD-10-CM | POA: Diagnosis not present

## 2018-02-21 MED ORDER — SODIUM CHLORIDE 0.9 % IV SOLN: 500.0000 mL | Freq: Once | INTRAVENOUS | Status: DC

## 2018-02-21 MED ORDER — PANTOPRAZOLE SODIUM 40 MG PO TBEC
40.0000 mg | DELAYED_RELEASE_TABLET | Freq: Two times a day (BID) | ORAL | 3 refills | Status: DC
Start: 2018-02-21 — End: 2018-12-13

## 2018-02-21 NOTE — Patient Instructions (Signed)
YOU HAD AN ENDOSCOPIC PROCEDURE TODAY AT THE [facility]:   Refer to the procedure report that was given to you for any specific questions about what was found during the examination.  If the procedure report does not answer your questions, please call your gastroenterologist to clarify.  If you requested that your care partner not be given the details of your procedure findings, then the procedure report has been included in a sealed envelope for you to review at your convenience later.  YOU SHOULD EXPECT: Some feelings of bloating in the abdomen. Passage of more gas than usual.  Walking can help get rid of the air that was put into your GI tract during the procedure and reduce the bloating. If you had a lower endoscopy (such as a colonoscopy or flexible sigmoidoscopy) you may notice spotting of blood in your stool or on the toilet paper. If you underwent a bowel prep for your procedure, you may not have a normal bowel movement for a few days.  Please Note:  You might notice some irritation and congestion in your nose or some drainage.  This is from the oxygen used during your procedure.  There is no need for concern and it should clear up in a day or so.  SYMPTOMS TO REPORT IMMEDIATELY:   Following upper endoscopy (EGD)  Vomiting of blood or coffee ground material  New chest pain or pain under the shoulder blades  Painful or persistently difficult swallowing  New shortness of breath  Fever of 100F or higher  Black, tarry-looking stools  For urgent or emergent issues, a gastroenterologist can be reached at any hour by calling [phone].   DIET:  We do recommend a small meal at first, but then you may proceed to your regular diet.  Drink plenty of fluids but you should avoid alcoholic beverages for 24 hours.  ACTIVITY:  You should plan to take it easy for the rest of today and you should NOT DRIVE or use heavy machinery until tomorrow (because of the sedation medicines used  during the test).    FOLLOW UP: Our staff will call the number listed on your records the next business day following your procedure to check on you and address any questions or concerns that you may have regarding the information given to you following your procedure. If we do not reach you, we will leave a message.  However, if you are feeling well and you are not experiencing any problems, there is no need to return our call.  We will assume that you have returned to your regular daily activities without incident.  If any biopsies were taken you will be contacted by phone or by letter within the next 1-3 weeks.  Please call us at [phone] if you have not heard about the biopsies in 3 weeks.    SIGNATURES/CONFIDENTIALITY: You and/or your care partner have signed paperwork which will be entered into your electronic medical record.  These signatures attest to the fact that that the information above on your After Visit Summary has been reviewed and is understood.  Full responsibility of the confidentiality of this discharge information lies with you and/or your care-partner. 

## 2018-02-21 NOTE — Progress Notes (Signed)
Called to room to assist during endoscopic procedure.  Patient ID and intended procedure confirmed with present staff. Received instructions for my participation in the procedure from the performing physician.  

## 2018-02-21 NOTE — Progress Notes (Signed)
Pt's states no medical or surgical changes since previsit or office visit. 

## 2018-02-21 NOTE — Op Note (Signed)
[facility] Patient Name: Charles Castillo Procedure Date: 02/21/2018 10:09 AM MRN: [id number] Endoscopist: Jerene Bears , MD Age: 84 Referring MD:  Date of Birth: 1934-10-22 Gender: Male Account #: [id number] Procedure:                Upper GI endoscopy Indications:              Follow-up of esophagitis (severe ulcerative                            esophagitis seen by EGD Jan 2019), previous                            dysphagia resolved completely with PPI (patient                            reports no dysphagia/odynophagia recently) Medicines:                Monitored Anesthesia Care Procedure:                Pre-Anesthesia Assessment:                           - Prior to the procedure, a History and Physical                            was performed, and patient medications and                            allergies were reviewed. The patient's tolerance of                            previous anesthesia was also reviewed. The risks                            and benefits of the procedure and the sedation                            options and risks were discussed with the patient.                            All questions were answered, and informed consent                            was obtained. Prior Anticoagulants: The patient has                            taken no previous anticoagulant or antiplatelet                            agents. ASA Grade Assessment: III - A patient with                            severe systemic disease. After reviewing the risks  and benefits, the patient was deemed in                            satisfactory condition to undergo the procedure.                           After obtaining informed consent, the endoscope was                            passed under direct vision. Throughout the                            procedure, the patient's blood pressure, pulse, and                            oxygen saturations were  monitored continuously. The                            Model GIF-HQ190 [id number]) scope was introduced                            through the mouth, and advanced to the second part                            of duodenum. The upper GI endoscopy was                            accomplished without difficulty. The patient                            tolerated the procedure well. Scope In: Scope Out: Findings:                 In the proximal esophagus there was a small inlet                            patch without nodularity or visible abnormality.                           Normal mucosa was found in the entire esophagus.                            Previously seen esophagitis has healed. Z-line is                            regular at 40 cm.                           A 4 cm hiatal hernia was present (40 to 44 cm from                            the incisors).                           Localized mildly erythematous mucosa without  bleeding was found in the cardia (at hiatus                            hernia). This is also improved compared to Jan 2019.                           The exam of the stomach was otherwise normal.                           Diffuse moderate inflammation characterized by                            erythema and granularity was found in the duodenal                            bulb and in the second portion of the duodenum.                            This is likely peptic related change/inflammation.                            Multiple biopsies were obtained with cold forceps                            for histology in a targeted manner. Complications:            No immediate complications. Estimated Blood Loss:     Estimated blood loss was minimal. Impression:               - Normal mucosa was found in the entire esophagus.                            Esophagitis has healed.                           - 4 cm hiatal hernia with mildly inflamed  mucosa.                           - Stomach otherwise normal.                           - Duodenitis. Multiple biopsies were obtained. Recommendation:           - Patient has a contact number available for                            emergencies. The signs and symptoms of potential                            delayed complications were discussed with the                            patient. Return to normal activities tomorrow.  Written discharge instructions were provided to the                            patient.                           - Resume previous diet.                           - Continue present medications. Can reduce                            pantoprazole to 40 mg once daily (before                            breakfast). If any recurrent issues (heartburn,                            nausea, trouble swallowing, or upper abdominal                            pain) then increase pantoprazole back to 40 mg                            twice daily.                           - Await pathology results. Jerene Bears, MD 02/21/2018 10:31:32 AM This report has been signed electronically.

## 2018-02-22 ENCOUNTER — Telehealth: Payer: Self-pay | Admitting: *Deleted

## 2018-02-22 ENCOUNTER — Telehealth: Payer: Self-pay

## 2018-02-22 NOTE — Telephone Encounter (Signed)
  Follow up Call-  Call Janel Beane number 02/21/2018 12/28/2017  Post procedure Call Marquita Lias phone  # [phone]  Permission to leave phone message Yes Yes  Some recent data might be hidden     Patient questions:  Do you have a fever, pain , or abdominal swelling? No. Pain Score  0 *  Have you tolerated food without any problems? Yes.    Have you been able to return to your normal activities? Yes.    Do you have any questions about your discharge instructions: Diet   No. Medications  No. Follow up visit  No.  Do you have questions or concerns about your Care? No.  Actions: * If pain score is 4 or above: No action needed, pain <4.

## 2018-02-22 NOTE — Telephone Encounter (Signed)
No answer. No identifier. Message left to call if questions or concerns and that we would make an attempt to call later in the day.

## 2018-02-27 ENCOUNTER — Encounter: Payer: Self-pay | Admitting: Internal Medicine

## 2018-04-04 DIAGNOSIS — B351 Tinea unguium: Secondary | ICD-10-CM | POA: Diagnosis not present

## 2018-04-04 DIAGNOSIS — D1801 Hemangioma of skin and subcutaneous tissue: Secondary | ICD-10-CM | POA: Diagnosis not present

## 2018-04-04 DIAGNOSIS — L309 Dermatitis, unspecified: Secondary | ICD-10-CM | POA: Diagnosis not present

## 2018-04-04 DIAGNOSIS — L821 Other seborrheic keratosis: Secondary | ICD-10-CM | POA: Diagnosis not present

## 2018-04-12 DIAGNOSIS — R918 Other nonspecific abnormal finding of lung field: Secondary | ICD-10-CM | POA: Diagnosis not present

## 2018-04-12 DIAGNOSIS — Z08 Encounter for follow-up examination after completed treatment for malignant neoplasm: Secondary | ICD-10-CM | POA: Diagnosis not present

## 2018-04-12 DIAGNOSIS — Z87891 Personal history of nicotine dependence: Secondary | ICD-10-CM | POA: Diagnosis not present

## 2018-04-12 DIAGNOSIS — C609 Malignant neoplasm of penis, unspecified: Secondary | ICD-10-CM | POA: Diagnosis not present

## 2018-04-12 DIAGNOSIS — Z8549 Personal history of malignant neoplasm of other male genital organs: Secondary | ICD-10-CM | POA: Diagnosis not present

## 2018-04-12 DIAGNOSIS — M47814 Spondylosis without myelopathy or radiculopathy, thoracic region: Secondary | ICD-10-CM | POA: Diagnosis not present

## 2018-04-12 DIAGNOSIS — Z79899 Other long term (current) drug therapy: Secondary | ICD-10-CM | POA: Diagnosis not present

## 2018-04-12 DIAGNOSIS — C680 Malignant neoplasm of urethra: Secondary | ICD-10-CM | POA: Diagnosis not present

## 2018-04-12 DIAGNOSIS — N289 Disorder of kidney and ureter, unspecified: Secondary | ICD-10-CM | POA: Diagnosis not present

## 2018-04-30 ENCOUNTER — Encounter: Payer: Self-pay | Admitting: Family Medicine

## 2018-04-30 ENCOUNTER — Ambulatory Visit (INDEPENDENT_AMBULATORY_CARE_PROVIDER_SITE_OTHER): Payer: Medicare Other | Admitting: Family Medicine

## 2018-04-30 VITALS — BP 120/64 | HR 68 | Temp 98.4°F | Ht 74.0 in | Wt 275.6 lb

## 2018-04-30 DIAGNOSIS — N183 Chronic kidney disease, stage 3 unspecified: Secondary | ICD-10-CM

## 2018-04-30 DIAGNOSIS — Z794 Long term (current) use of insulin: Secondary | ICD-10-CM

## 2018-04-30 DIAGNOSIS — E0800 Diabetes mellitus due to underlying condition with hyperosmolarity without nonketotic hyperglycemic-hyperosmolar coma (NKHHC): Secondary | ICD-10-CM | POA: Diagnosis not present

## 2018-04-30 DIAGNOSIS — E785 Hyperlipidemia, unspecified: Secondary | ICD-10-CM | POA: Diagnosis not present

## 2018-04-30 DIAGNOSIS — I10 Essential (primary) hypertension: Secondary | ICD-10-CM

## 2018-04-30 LAB — CBC
HCT: 42.1 % (ref 39.0–52.0)
Hemoglobin: 14.1 g/dL (ref 13.0–17.0)
MCHC: 33.6 g/dL (ref 30.0–36.0)
MCV: 91.1 fl (ref 78.0–100.0)
PLATELETS: 183 10*3/uL (ref 150.0–400.0)
RBC: 4.62 Mil/uL (ref 4.22–5.81)
RDW: 14.2 % (ref 11.5–15.5)
WBC: 5.9 10*3/uL (ref 4.0–10.5)

## 2018-04-30 LAB — COMPREHENSIVE METABOLIC PANEL
ALBUMIN: 3.9 g/dL (ref 3.5–5.2)
ALT: 18 U/L (ref 0–53)
AST: 15 U/L (ref 0–37)
Alkaline Phosphatase: 77 U/L (ref 39–117)
BUN: 36 mg/dL — AB (ref 6–23)
CHLORIDE: 109 meq/L (ref 96–112)
CO2: 24 mEq/L (ref 19–32)
CREATININE: 2.34 mg/dL — AB (ref 0.40–1.50)
Calcium: 9.7 mg/dL (ref 8.4–10.5)
GFR: 28.54 mL/min — ABNORMAL LOW (ref >60.00)
Glucose, Bld: 131 mg/dL — ABNORMAL HIGH (ref 70–99)
POTASSIUM: 4.6 meq/L (ref 3.5–5.1)
Sodium: 139 mEq/L (ref 135–145)
Total Bilirubin: 0.4 mg/dL (ref 0.2–1.2)
Total Protein: 7.5 g/dL (ref 6.0–8.3)

## 2018-04-30 LAB — MICROALBUMIN / CREATININE URINE RATIO
Creatinine,U: 62.8 mg/dL
MICROALB/CREAT RATIO: 9.7 mg/g (ref 0.0–30.0)
Microalb, Ur: 6.1 mg/dL — ABNORMAL HIGH (ref 0.0–1.9)

## 2018-04-30 LAB — HEMOGLOBIN A1C: HEMOGLOBIN A1C: 6.1 % (ref 4.6–6.5)

## 2018-04-30 NOTE — Assessment & Plan Note (Signed)
Update bmet. If GFR under 30 would refer to renal. [facility] said creatinine up some to 2.5 - had been 1.9 last time.  Nervous about adding ace-I though could have diabetic nephropathy- proteinuria noted on UAs on past- more recently not able to urinate as easily for [place] after his penile cancer surgery

## 2018-04-30 NOTE — Patient Instructions (Addendum)
Health Maintenance Due  Topic Date Due  . URINE MICROALBUMIN -ordered today 05/24/2017  . FOOT EXAM - today 11/28/2017  . HEMOGLOBIN A1C -ordered today 04/29/2018   Please stop by lab before you go  Ask lab for hat for the urine

## 2018-04-30 NOTE — Assessment & Plan Note (Signed)
S: controlled on amlodipine 5 mg.  GFR stable and 30s for the most part- had trended up at [place] so we will repeat BP Readings from Last 3 Encounters:  04/30/18 120/64  02/21/18 120/66  12/28/17 121/79  A/P: We discussed blood pressure goal of <140/90. Continue current meds:   Update bmet

## 2018-04-30 NOTE — Assessment & Plan Note (Signed)
S: Mild poorly controlled on atorvastatin 40 mg  A/P: LDL slightly above goal at 111.  Given his age and use for primary prevention only-I am okay with LDL as long as under 130

## 2018-04-30 NOTE — Progress Notes (Signed)
Subjective:  Charles Castillo. is a 84 y.o. year old very pleasant male patient who presents for/with See problem oriented charting ROS- No chest pain or shortness of breath. No headache or blurry vision. No hypoglycemia   Past Medical History-  Patient Active Problem List   Diagnosis Date Noted  . Diabetes mellitus ([place]) 09/15/2014    Priority: High  . Dysphagia 10/30/2017    Priority: Medium  . Erectile dysfunction 09/15/2014    Priority: Medium  . 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 CKD (chronic kidney disease), stage III ([place]) 03/24/2008    Priority: Medium  . Hyperlipidemia 08/27/2007    Priority: Medium  . Essential hypertension 07/03/2007    Priority: Medium  . Sleep apnea 07/03/2007    Priority: Medium  . Former smoker 09/15/2014    Priority: Low  . Obesity 10/10/2012    Priority: Low  . Osteoarthritis 07/03/2007    Priority: Low  . Hypokalemia 05/24/2017  . Hyponatremia 05/24/2017  . Syncope 05/23/2017  . Acute renal failure superimposed on stage 3 chronic kidney disease ([place]) 05/23/2017  . Gout 05/24/2016    Medications- reviewed and updated Current Outpatient Medications  Medication Sig Dispense Refill  . amLODipine (NORVASC) 5 MG tablet Take 1 tablet by mouth every day 90 tablet 1  . atorvastatin (LIPITOR) 40 MG tablet Take 1 tablet by mouth daily 90 tablet 2  . DiphenhydrAMINE HCl, Sleep, (SOMINEX PO) Take 1 tablet by mouth at bedtime.     . pantoprazole (PROTONIX) 40 MG tablet Take 1 tablet (40 mg total) by mouth 2 (two) times daily. 90 tablet 3  . senna (SENOKOT) 8.6 MG TABS tablet Take 2 tablets (17.2 mg total) by mouth at bedtime. 120 each 0   No current facility-administered medications for this visit.     Objective: BP 120/64 (BP Location: Left Arm, Patient Position: Sitting, Cuff Size: Large)   Pulse 68   Temp 98.4 F (36.9 C) (Oral)   Ht 6' 2"  (1.88 m)   Wt 275 lb 9.6 oz (125 kg)   SpO2 98%   BMI 35.38 kg/m  Gen:  NAD, resting comfortably CV: RRR no murmurs rubs or gallops Lungs: CTAB no crackles, wheeze, rhonchi Abdomen: soft/nontender/nondistended/normal bowel sounds. No rebound or guarding.  Ext: trace edema Skin: warm, dry Neuro: normal gait and speech   Diabetic Foot Exam - Simple   Simple Foot Form Diabetic Foot exam was performed with the following findings:  Yes 04/30/2018 10:42 AM  Visual Inspection No deformities, no ulcerations, no other skin breakdown bilaterally:  Yes Sensation Testing Intact to touch and monofilament testing bilaterally:  Yes Pulse Check Posterior Tibialis and Dorsalis pulse intact bilaterally:  Yes Comments    Assessment/Plan:  Other notes: 1.Currently under surveillance at [facility] for urethral and penile cancer- had already had his treatments. If his next check up is clear- he will be moved to q6 month visits and he is excited about that 2.  Dysphasia noted last visit with thick/dense foods-referred to GI for endoscopy consideration-noted to have acid related esophagitis.  He should remain on Protonix- he is actually only on one a day and tolerating this. That has been better lately  Diabetes mellitus ([place]) S: Diet and exercise controlled on no medication. CBGs-does not check  Wt Readings from Last 3 Encounters:  04/30/18 275 lb 9.6 oz (125 kg)  02/21/18 270 lb (122.5 kg)  02/14/18 270 lb (122.5 kg)   Exercise and diet-  last visit his weight was down 17 pounds to 255- he has swung up 20 lbs Lab Results  Component Value Date   HGBA1C 6.1 10/30/2017   HGBA1C 6.3 11/28/2016   HGBA1C 6.1 05/24/2016   A/P: update a1c today     Essential hypertension S: controlled on amlodipine 5 mg.  GFR stable and 30s for the most part- had trended up at [place] so we will repeat BP Readings from Last 3 Encounters:  04/30/18 120/64  02/21/18 120/66  12/28/17 121/79  A/P: We discussed blood pressure goal of <140/90. Continue current meds:   Update  bmet  Hyperlipidemia S: Mild poorly controlled on atorvastatin 40 mg  A/P: LDL slightly above goal at 111.  Given his age and use for primary prevention only-I am okay with LDL as long as under 130  CKD (chronic kidney disease), stage III Update bmet. If GFR under 30 would refer to renal. [facility] said creatinine up some to 2.5 - had been 1.9 last time.  Nervous about adding ace-I though could have diabetic nephropathy- proteinuria noted on UAs on past- more recently not able to urinate as easily for [place] after his penile cancer surgery- he agrees to try today  Return in about 6 months (around 10/31/2018) for follow up- or sooner if needed.  Lab/Order associations: Diabetes mellitus due to underlying condition with hyperosmolarity without coma, with long-term current use of insulin (HCC) - Plan: CBC, Comp Met (CMET), HgB A1c, Microalbumin / creatinine urine ratio  Return precautions advised.  Garret Reddish, MD

## 2018-04-30 NOTE — Assessment & Plan Note (Signed)
S: Diet and exercise controlled on no medication. CBGs-does not check  Wt Readings from Last 3 Encounters:  04/30/18 275 lb 9.6 oz (125 kg)  02/21/18 270 lb (122.5 kg)  02/14/18 270 lb (122.5 kg)   Exercise and diet- last visit his weight was down 17 pounds to 255- he has swung up 20 lbs Lab Results  Component Value Date   HGBA1C 6.1 10/30/2017   HGBA1C 6.3 11/28/2016   HGBA1C 6.1 05/24/2016   A/P: update a1c today

## 2018-04-30 NOTE — Progress Notes (Signed)
Normal urine diabetes test-No leakage of protein into urine related to diabetes Diabetes remains well controlled at 6.1 below goal of 7 and even more ideal goal of 6.5 which is great news.  Your CBC was normal (blood counts, infection fighting cells, platelets). Your CMET was somewhat stable (kidney, liver, and electrolytes, blood sugar). Kidney function slightly worse than last check here but slightly better than when [facility] checked it. Team lets set him up for another bmp/bmet under elevated creatinine in 3-4 weeks- if filtration rate stays below 30 lets refer him to renal/kidney doctors at that time.

## 2018-05-01 ENCOUNTER — Other Ambulatory Visit: Payer: Self-pay

## 2018-05-01 DIAGNOSIS — R7989 Other specified abnormal findings of blood chemistry: Secondary | ICD-10-CM

## 2018-05-30 ENCOUNTER — Other Ambulatory Visit (INDEPENDENT_AMBULATORY_CARE_PROVIDER_SITE_OTHER): Payer: Medicare Other

## 2018-05-30 DIAGNOSIS — R7989 Other specified abnormal findings of blood chemistry: Secondary | ICD-10-CM

## 2018-05-30 LAB — BASIC METABOLIC PANEL
BUN: 35 mg/dL — AB (ref 6–23)
CALCIUM: 9.6 mg/dL (ref 8.4–10.5)
CO2: 24 mEq/L (ref 19–32)
Chloride: 109 mEq/L (ref 96–112)
Creatinine, Ser: 2.42 mg/dL — ABNORMAL HIGH (ref 0.40–1.50)
GFR: 27.45 mL/min — AB (ref >60.00)
GLUCOSE: 143 mg/dL — AB (ref 70–99)
Potassium: 4.8 mEq/L (ref 3.5–5.1)
Sodium: 141 mEq/L (ref 135–145)

## 2018-05-31 ENCOUNTER — Other Ambulatory Visit: Payer: Self-pay

## 2018-05-31 ENCOUNTER — Telehealth: Payer: Self-pay | Admitting: Family Medicine

## 2018-05-31 ENCOUNTER — Other Ambulatory Visit: Payer: Medicare Other

## 2018-05-31 DIAGNOSIS — N184 Chronic kidney disease, stage 4 (severe): Secondary | ICD-10-CM

## 2018-05-31 NOTE — Telephone Encounter (Signed)
Duplicate

## 2018-05-31 NOTE — Telephone Encounter (Signed)
Copied from [place] [id number]. Topic: General - Other >> May 31, 2018  9:41 AM Yvette Rack wrote: Reason for CRM: Pt returned call for lab results. Cb# [phone]

## 2018-05-31 NOTE — Telephone Encounter (Signed)
Charted in result notes. 

## 2018-05-31 NOTE — Telephone Encounter (Signed)
Copied from [place] [id number]. Topic: General - Other >> May 31, 2018  9:48 AM Yvette Rack wrote: Reason for CRM: Pt returned call for lab results. Cb# [phone]

## 2018-06-05 ENCOUNTER — Encounter: Payer: Self-pay | Admitting: Family Medicine

## 2018-06-14 DIAGNOSIS — N184 Chronic kidney disease, stage 4 (severe): Secondary | ICD-10-CM | POA: Diagnosis not present

## 2018-06-14 DIAGNOSIS — N2581 Secondary hyperparathyroidism of renal origin: Secondary | ICD-10-CM | POA: Diagnosis not present

## 2018-06-14 DIAGNOSIS — E1129 Type 2 diabetes mellitus with other diabetic kidney complication: Secondary | ICD-10-CM | POA: Diagnosis not present

## 2018-06-14 DIAGNOSIS — C680 Malignant neoplasm of urethra: Secondary | ICD-10-CM | POA: Diagnosis not present

## 2018-06-14 DIAGNOSIS — I129 Hypertensive chronic kidney disease with stage 1 through stage 4 chronic kidney disease, or unspecified chronic kidney disease: Secondary | ICD-10-CM | POA: Diagnosis not present

## 2018-06-14 LAB — BASIC METABOLIC PANEL
BUN: 37 — AB (ref 4–21)
CREATININE: 2.7 — AB (ref 0.6–1.3)
Glucose: 108
Potassium: 4.6 (ref 3.4–5.3)
Sodium: 143 (ref 137–147)

## 2018-06-14 LAB — CBC AND DIFFERENTIAL
HCT: 43 (ref 41–53)
Hemoglobin: 14.4 (ref 13.5–17.5)
PLATELETS: 187 (ref 150–399)
WBC: 7

## 2018-06-25 ENCOUNTER — Other Ambulatory Visit: Payer: Self-pay | Admitting: Nephrology

## 2018-06-25 DIAGNOSIS — E1029 Type 1 diabetes mellitus with other diabetic kidney complication: Secondary | ICD-10-CM

## 2018-06-25 DIAGNOSIS — C680 Malignant neoplasm of urethra: Secondary | ICD-10-CM

## 2018-06-25 DIAGNOSIS — I129 Hypertensive chronic kidney disease with stage 1 through stage 4 chronic kidney disease, or unspecified chronic kidney disease: Secondary | ICD-10-CM

## 2018-06-25 DIAGNOSIS — N2581 Secondary hyperparathyroidism of renal origin: Secondary | ICD-10-CM

## 2018-06-25 DIAGNOSIS — N184 Chronic kidney disease, stage 4 (severe): Secondary | ICD-10-CM

## 2018-06-27 ENCOUNTER — Ambulatory Visit
Admission: RE | Admit: 2018-06-27 | Discharge: 2018-06-27 | Disposition: A | Discharge status: Home / Self Care | Payer: Medicare Other | Source: Ambulatory Visit | Attending: Nephrology | Admitting: Nephrology

## 2018-06-27 DIAGNOSIS — C680 Malignant neoplasm of urethra: Secondary | ICD-10-CM

## 2018-06-27 DIAGNOSIS — I129 Hypertensive chronic kidney disease with stage 1 through stage 4 chronic kidney disease, or unspecified chronic kidney disease: Secondary | ICD-10-CM

## 2018-06-27 DIAGNOSIS — E1029 Type 1 diabetes mellitus with other diabetic kidney complication: Secondary | ICD-10-CM

## 2018-06-27 DIAGNOSIS — N2581 Secondary hyperparathyroidism of renal origin: Secondary | ICD-10-CM

## 2018-06-27 DIAGNOSIS — N184 Chronic kidney disease, stage 4 (severe): Secondary | ICD-10-CM | POA: Diagnosis not present

## 2018-07-05 ENCOUNTER — Other Ambulatory Visit: Payer: Self-pay | Admitting: Family Medicine

## 2018-07-05 ENCOUNTER — Encounter: Payer: Self-pay | Admitting: Family Medicine

## 2018-08-16 DIAGNOSIS — Z8559 Personal history of malignant neoplasm of other urinary tract organ: Secondary | ICD-10-CM | POA: Diagnosis not present

## 2018-08-16 DIAGNOSIS — Z87891 Personal history of nicotine dependence: Secondary | ICD-10-CM | POA: Diagnosis not present

## 2018-08-16 DIAGNOSIS — C680 Malignant neoplasm of urethra: Secondary | ICD-10-CM | POA: Diagnosis not present

## 2018-08-16 DIAGNOSIS — Z08 Encounter for follow-up examination after completed treatment for malignant neoplasm: Secondary | ICD-10-CM | POA: Diagnosis not present

## 2018-08-16 DIAGNOSIS — Z9889 Other specified postprocedural states: Secondary | ICD-10-CM | POA: Diagnosis not present

## 2018-08-16 DIAGNOSIS — Z79899 Other long term (current) drug therapy: Secondary | ICD-10-CM | POA: Diagnosis not present

## 2018-08-23 ENCOUNTER — Ambulatory Visit (INDEPENDENT_AMBULATORY_CARE_PROVIDER_SITE_OTHER): Payer: Medicare Other | Admitting: Family Medicine

## 2018-08-23 ENCOUNTER — Encounter: Payer: Self-pay | Admitting: Family Medicine

## 2018-08-23 VITALS — BP 118/68 | HR 72 | Temp 97.9°F | Resp 16 | Ht 74.0 in | Wt 284.4 lb

## 2018-08-23 DIAGNOSIS — E119 Type 2 diabetes mellitus without complications: Secondary | ICD-10-CM | POA: Diagnosis not present

## 2018-08-23 DIAGNOSIS — L03032 Cellulitis of left toe: Secondary | ICD-10-CM | POA: Diagnosis not present

## 2018-08-23 DIAGNOSIS — N184 Chronic kidney disease, stage 4 (severe): Secondary | ICD-10-CM | POA: Diagnosis not present

## 2018-08-23 MED ORDER — CEPHALEXIN 250 MG PO CAPS: 250.0000 mg | ORAL_CAPSULE | Freq: Three times a day (TID) | ORAL | 0 refills | Status: DC

## 2018-08-23 NOTE — Progress Notes (Signed)
Subjective:  Tamas Suen. is a 84 y.o. year old very pleasant male patient who presents for/with See problem oriented charting ROS- no fever, chills, nausea, vomiting. No chest pain or shortness of breath. Diabetes has been well controlled.    Past Medical History-  Patient Active Problem List   Diagnosis Date Noted  . Diabetes mellitus ([place]) 09/15/2014    Priority: High  . Dysphagia 10/30/2017    Priority: Medium  . Erectile dysfunction 09/15/2014    Priority: Medium  . Urethral carcinoma ([place]) 11/04/2009    Priority: Medium  . CKD (chronic kidney disease), stage III ([place]) 03/24/2008    Priority: Medium  . Hyperlipidemia 08/27/2007    Priority: Medium  . Essential hypertension 07/03/2007    Priority: Medium  . Sleep apnea 07/03/2007    Priority: Medium  . Former smoker 09/15/2014    Priority: Low  . Obesity 10/10/2012    Priority: Low  . Osteoarthritis 07/03/2007    Priority: Low  . Hypokalemia 05/24/2017  . Hyponatremia 05/24/2017  . Syncope 05/23/2017  . Acute renal failure superimposed on stage 3 chronic kidney disease ([place]) 05/23/2017  . Gout 05/24/2016    Medications- reviewed and updated Current Outpatient Medications  Medication Sig Dispense Refill  . amLODipine (NORVASC) 5 MG tablet Take 1 tablet by mouth every day 90 tablet 1  . atorvastatin (LIPITOR) 40 MG tablet Take 1 tablet by mouth daily 90 tablet 2  . DiphenhydrAMINE HCl, Sleep, (SOMINEX PO) Take 1 tablet by mouth at bedtime.     . pantoprazole (PROTONIX) 40 MG tablet Take 1 tablet (40 mg total) by mouth 2 (two) times daily. (Patient taking differently: Take 40 mg by mouth daily. ) 90 tablet 3  . cephALEXin (KEFLEX) 250 MG capsule Take 1 capsule (250 mg total) by mouth every 8 (eight) hours. 21 capsule 0  . senna (SENOKOT) 8.6 MG TABS tablet Take 2 tablets (17.2 mg total) by mouth at bedtime. (Patient not taking: Reported on 08/23/2018) 120 each 0   No current facility-administered medications for  this visit.     Objective: BP 118/68   Pulse 72   Temp 97.9 F (36.6 C) (Oral)   Resp 16   Ht 6\' 2"  (1.88 m)   Wt 284 lb 6.4 oz (129 kg)   SpO2 97%   BMI 36.51 kg/m  Gen: NAD, resting comfortably CV: RRR no murmurs rubs or gallops Lungs: CTAB no crackles, wheeze, rhonchi Abdomen: soft/nontender/nondistended Ext: no edema Skin: warm, dry, on left great toe on lateral portion there is inflammation and mild weeping of purulence- some edem/hypertrophy of tissue over nail. I+D local performed without digital block after patient consent (due to 1 incision vs. Needing 2 points of entry for nerve block). Able to express some purulence and blood. After drained- hypertrophied tissue still overlaid corner of nail suggesting ingrown nail  Assessment/Plan:   Paronychia of great toe of left foot Cellulitis of toe of left foot Controlled Type 2 diabetes mellitus without complication, without long-term current use of insulin (HCC)  S: Patient states he noted sore, inflamed tender toe on left great toe about 10 days ago after trimming nails. Used neosporin and hydrogen peroxide without relief. He denies drainage. Not a lot of pain with walking but if he pushes on lateral edge of nail notes significant achy pain.  A/P: initially looked like primarily paronychia- after I+D appears to have ingrown nail. Also with extension throughout toe worried about cellulitis- will cover with keflex  for 7 days at reduced dose due to CKD IV. Advised ways to avoid in future- discussed warm soaks a few times a day then lifting nail up to help with ingrown nail. Also has onychomycosis which makes infection more likely so discussed using lamisil every other day for 3 months to adjust for CKD IV  From [facility] " Infected edge of nail (paronychia) with some pus found today and drained. Skin may also be infected with how it wraps around toe. Antibiotic keflex for 7 days. I want to see you back Monday or Tuesday if not improving or  sooner if worsens.   If improves- update me in 10 days and I will send in medicine to help treat fungus in your toenail (likely every other day for 3 months given your kidney function) "   Future Appointments  Date Time Provider [place]  10/31/2018  9:45 AM Marin Olp, MD [facility]    Lab/Order associations: Paronychia of great toe of left foot  Cellulitis of toe of left foot  Type 2 diabetes mellitus without complication, without long-term current use of insulin (HCC)  CKD (chronic kidney disease), stage IV ([place])  Meds ordered this encounter  Medications  . cephALEXin (KEFLEX) 250 MG capsule    Sig: Take 1 capsule (250 mg total) by mouth every 8 (eight) hours.    Dispense:  21 capsule    Refill:  0    Return precautions advised.  Garret Reddish, MD

## 2018-08-23 NOTE — Patient Instructions (Addendum)
Infected edge of nail (paronychia) with some pus found today and drained. Skin may also be infected with how it wraps around toe. Antibiotic keflex for 7 days. I want to see you back Monday or Tuesday if not improving or sooner if worsens.   If doesn't improve may need to consider referral to podiatry to remove the nail or at least a portion of it as this may have started with ingrown toenail. Do not trim nails to the base of them   If improves- update me in 10 days and I will send in medicine to help treat fungus in your toenail (likely every other day for 3 months given your kidney function)

## 2018-08-28 ENCOUNTER — Other Ambulatory Visit: Payer: Self-pay

## 2018-08-28 ENCOUNTER — Telehealth: Payer: Self-pay | Admitting: *Deleted

## 2018-08-28 DIAGNOSIS — M79675 Pain in left toe(s): Secondary | ICD-10-CM

## 2018-08-28 NOTE — Telephone Encounter (Signed)
Copied from [place] [id number]. Topic: Referral - Request >> Aug 28, 2018  9:07 AM Lennox Solders wrote: Reason for CRM: pt saw dr hunter on 9-5 and per pt is he is still having foot pain dr hunter will refer to podiatrist. Pt has medicare aarp

## 2018-08-28 NOTE — Telephone Encounter (Signed)
Referral placed as requested.

## 2018-09-15 ENCOUNTER — Encounter: Payer: Self-pay | Admitting: Family Medicine

## 2018-09-23 ENCOUNTER — Other Ambulatory Visit: Payer: Self-pay | Admitting: Family Medicine

## 2018-09-24 DIAGNOSIS — Z23 Encounter for immunization: Secondary | ICD-10-CM | POA: Diagnosis not present

## 2018-09-27 ENCOUNTER — Ambulatory Visit: Payer: Self-pay | Admitting: Podiatry

## 2018-10-01 ENCOUNTER — Encounter: Payer: Self-pay | Admitting: Family Medicine

## 2018-10-02 ENCOUNTER — Ambulatory Visit (INDEPENDENT_AMBULATORY_CARE_PROVIDER_SITE_OTHER): Payer: Medicare Other | Admitting: Family Medicine

## 2018-10-02 ENCOUNTER — Encounter: Payer: Self-pay | Admitting: Family Medicine

## 2018-10-02 VITALS — BP 100/56 | HR 85 | Temp 98.0°F | Ht 74.0 in | Wt 284.6 lb

## 2018-10-02 DIAGNOSIS — L03031 Cellulitis of right toe: Secondary | ICD-10-CM

## 2018-10-02 MED ORDER — CEPHALEXIN 250 MG PO CAPS: 250.0000 mg | ORAL_CAPSULE | Freq: Three times a day (TID) | ORAL | 0 refills | Status: DC

## 2018-10-02 NOTE — Progress Notes (Signed)
Subjective:  Charles Batz. is a 84 y.o. year old very pleasant male patient who presents for/with See problem oriented charting ROS-no fever, chills, nausea, vomiting.  Past Medical History-  Patient Active Problem List   Diagnosis Date Noted  . Diabetes mellitus ([place]) 09/15/2014    Priority: High  . Dysphagia 10/30/2017    Priority: Medium  . Erectile dysfunction 09/15/2014    Priority: Medium  . Urethral carcinoma ([place]) 11/04/2009    Priority: Medium  . CKD (chronic kidney disease), stage III ([place]) 03/24/2008    Priority: Medium  . Hyperlipidemia 08/27/2007    Priority: Medium  . Essential hypertension 07/03/2007    Priority: Medium  . Sleep apnea 07/03/2007    Priority: Medium  . Former smoker 09/15/2014    Priority: Low  . Obesity 10/10/2012    Priority: Low  . Osteoarthritis 07/03/2007    Priority: Low  . Hypokalemia 05/24/2017  . Hyponatremia 05/24/2017  . Syncope 05/23/2017  . Acute renal failure superimposed on stage 3 chronic kidney disease ([place]) 05/23/2017  . Gout 05/24/2016    Medications- reviewed and updated Current Outpatient Medications  Medication Sig Dispense Refill  . amLODipine (NORVASC) 5 MG tablet Take 1 tablet by mouth every day 90 tablet 1  . atorvastatin (LIPITOR) 40 MG tablet Take 1 tablet by mouth daily 90 tablet 1  . DiphenhydrAMINE HCl, Sleep, (SOMINEX PO) Take 1 tablet by mouth at bedtime.     . pantoprazole (PROTONIX) 40 MG tablet Take 1 tablet (40 mg total) by mouth 2 (two) times daily. (Patient taking differently: Take 40 mg by mouth daily. ) 90 tablet 3  . cephALEXin (KEFLEX) 250 MG capsule Take 1 capsule (250 mg total) by mouth every 8 (eight) hours. 21 capsule 0   No current facility-administered medications for this visit.     Objective: BP (!) 100/56 (BP Location: Left Arm, Patient Position: Sitting, Cuff Size: Large)   Pulse 85   Temp 98 F (36.7 C) (Oral)   Ht 6\' 2"  (1.88 m)   Wt 284 lb 9.6 oz (129.1 kg)   SpO2 97%    BMI 36.54 kg/m  Gen: NAD, resting comfortably CV: RRR no murmurs rubs or gallops Lungs: CTAB no crackles, wheeze, rhonchi Abdomen: soft/nontender/nondistended/normal bowel sounds. Ext: no edema, right great toe medial edge of nail - nail is ingrown- very tender to touch in surrounding tissues- erythema noted prominently in this area and also extends around the nail.  He is not tender to palpation in the other areas though.  Some dried drainage noted next to medial nailbed-not able to express further discharge.  Thickened and yellow toenail on right great toe. Skin: warm, dry  Assessment/Plan:  Paronychia of great toe of right foot  Cellulitis of great toe of right foot S: Patient was seen in early September for paronychia of great toe and cellulitis associated with this.  We attempted incision and drainage with little relief.  We placed him on Keflex and he had improvement in his symptoms.  Referred to podiatry due to ingrown nail for consideration of medial third nail removal.  [facility]-but later informed they were no longer taking new patients.  Then we [facility] new [facility] and patient is scheduled in 2 weeks.  He actually thought about canceling this visit as toe had gone back almost completely to normal on right great toe.  When he woke up this morning and noted intense  pain and redness at medial nailbed-he has noted expanding erythema around toenail since that time. A/P: 84 year old male with recurrent paronychia and associated cellulitis of right great toe.  No pain at the joint itself so doubt gout.  Patient likely needs at least medial third nail removal-likely set up with podiatry.  Since he had good success on Keflex in the past we will send in a new prescription for this.  If he has new or worsening symptoms before his podiatry appointment-he will follow-up with [place].  Future Appointments  Date Time Provider [place]   10/11/2018  2:30 PM Trula Slade, [place] [facility]  10/31/2018  9:45 AM Yong Channel, Brayton Mars, MD [facility]   Meds ordered this encounter  Medications  . cephALEXin (KEFLEX) 250 MG capsule    Sig: Take 1 capsule (250 mg total) by mouth every 8 (eight) hours.    Dispense:  21 capsule    Refill:  0    Return precautions advised.  Garret Reddish, MD

## 2018-10-02 NOTE — Patient Instructions (Signed)
Toe nail is ingrown and is causing infection again. We need to remove the toenail- glad you have an appointment with Dr. Jacqualyn Posey with [facility]. Lets try to calm down infection with antibiotic while we await the procedure.   See [place] back sooner than that if redness continues to spread or if pain continues to worsen.

## 2018-10-09 ENCOUNTER — Encounter: Payer: Self-pay | Admitting: Family Medicine

## 2018-10-11 ENCOUNTER — Ambulatory Visit (INDEPENDENT_AMBULATORY_CARE_PROVIDER_SITE_OTHER): Payer: Medicare Other | Admitting: Podiatry

## 2018-10-11 ENCOUNTER — Encounter: Payer: Self-pay | Admitting: Podiatry

## 2018-10-11 DIAGNOSIS — L600 Ingrowing nail: Secondary | ICD-10-CM | POA: Diagnosis not present

## 2018-10-11 NOTE — Patient Instructions (Signed)

## 2018-10-12 ENCOUNTER — Encounter: Payer: Self-pay | Admitting: Family Medicine

## 2018-10-12 ENCOUNTER — Ambulatory Visit (INDEPENDENT_AMBULATORY_CARE_PROVIDER_SITE_OTHER): Payer: Medicare Other | Admitting: Family Medicine

## 2018-10-12 VITALS — BP 122/78 | HR 81 | Temp 98.6°F | Ht 74.0 in | Wt 286.6 lb

## 2018-10-12 DIAGNOSIS — N184 Chronic kidney disease, stage 4 (severe): Secondary | ICD-10-CM

## 2018-10-12 DIAGNOSIS — G4730 Sleep apnea, unspecified: Secondary | ICD-10-CM

## 2018-10-12 DIAGNOSIS — R1310 Dysphagia, unspecified: Secondary | ICD-10-CM

## 2018-10-12 DIAGNOSIS — I10 Essential (primary) hypertension: Secondary | ICD-10-CM | POA: Diagnosis not present

## 2018-10-12 DIAGNOSIS — E1122 Type 2 diabetes mellitus with diabetic chronic kidney disease: Secondary | ICD-10-CM | POA: Diagnosis not present

## 2018-10-12 DIAGNOSIS — E785 Hyperlipidemia, unspecified: Secondary | ICD-10-CM

## 2018-10-12 LAB — COMPREHENSIVE METABOLIC PANEL
ALBUMIN: 3.9 g/dL (ref 3.5–5.2)
ALT: 23 U/L (ref 0–53)
AST: 17 U/L (ref 0–37)
Alkaline Phosphatase: 89 U/L (ref 39–117)
BUN: 33 mg/dL — AB (ref 6–23)
CHLORIDE: 107 meq/L (ref 96–112)
CO2: 25 mEq/L (ref 19–32)
CREATININE: 2.76 mg/dL — AB (ref 0.40–1.50)
Calcium: 9.3 mg/dL (ref 8.4–10.5)
GFR: 23.56 mL/min — ABNORMAL LOW (ref >60.00)
GLUCOSE: 151 mg/dL — AB (ref 70–99)
Potassium: 5.2 mEq/L — ABNORMAL HIGH (ref 3.5–5.1)
SODIUM: 141 meq/L (ref 135–145)
TOTAL PROTEIN: 6.8 g/dL (ref 6.0–8.3)
Total Bilirubin: 0.6 mg/dL (ref 0.2–1.2)

## 2018-10-12 LAB — CBC
HCT: 43.3 % (ref 39.0–52.0)
Hemoglobin: 14.7 g/dL (ref 13.0–17.0)
MCHC: 34 g/dL (ref 30.0–36.0)
MCV: 89.8 fl (ref 78.0–100.0)
Platelets: 174 10*3/uL (ref 150.0–400.0)
RBC: 4.82 Mil/uL (ref 4.22–5.81)
RDW: 14.7 % (ref 11.5–15.5)
WBC: 6.3 10*3/uL (ref 4.0–10.5)

## 2018-10-12 LAB — HEMOGLOBIN A1C: HEMOGLOBIN A1C: 6.4 % (ref 4.6–6.5)

## 2018-10-12 LAB — LDL CHOLESTEROL, DIRECT: Direct LDL: 123 mg/dL

## 2018-10-12 NOTE — Assessment & Plan Note (Signed)
S: Diet controlled on no medications Exercise and diet- patient had been able to get his weight down to 255 within last 2 years (was during chemotherapy)- unfortunately up another 2 lbs. Not exercising at present. He feels could reduce volume.  Lab Results  Component Value Date   HGBA1C 6.1 04/30/2018   HGBA1C 6.1 10/30/2017   HGBA1C 6.3 11/28/2016   A/P: Update A1c today, encouraged even 5 lbs weight los with improved exercise and healthier eating   Set a goal for exercise of 3x a week walking for 10 minutes for 1 month.

## 2018-10-12 NOTE — Patient Instructions (Addendum)
Set a goal for exercise of 3x a week walking for 10 minutes for 1 month.   Before you leave- cancel the November 13th visit then schedule something for 4-6 months from today for check in.   Please stop by lab before you go

## 2018-10-12 NOTE — Assessment & Plan Note (Signed)
S: Patient with history of dysphagia with thick foods.  He had acid related esophagitis on endoscopy.  Plan is for long-term Protonix-has been doing okay on daily treatment A/P:  Stable. Continue current rx unless renal suggests reducing due to CKD

## 2018-10-12 NOTE — Assessment & Plan Note (Signed)
Got machine in 2002 from [facility]- 7 year replacement needed and he is 2x beyond that. Called [facility] for supplies but couldn't get supplies- so we will [facility] to [facility] to try to get him set up. Compliant with CPAp 100% of nights for over 10 years. Feels well rested when he uses it. When he doesn't use it- does not feel refreshed- so always uses

## 2018-10-12 NOTE — Progress Notes (Signed)
Subjective:  Charles Castillo. is a 84 y.o. year old very pleasant male patient who presents for/with See problem oriented charting ROS- No chest pain or shortness of breath. No headache or blurry vision.    Past Medical History-  Patient Active Problem List   Diagnosis Date Noted  . Type 2 diabetes mellitus with renal complication (HCC) 18/56/3149    Priority: High  . Dysphagia 10/30/2017    Priority: Medium  . Erectile dysfunction 09/15/2014    Priority: Medium  . 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 CKD (chronic kidney disease), stage IV ([place]) 03/24/2008    Priority: Medium  . Hyperlipidemia 08/27/2007    Priority: Medium  . Essential hypertension 07/03/2007    Priority: Medium  . Sleep apnea 07/03/2007    Priority: Medium  . Former smoker 09/15/2014    Priority: Low  . Morbid obesity ([place]) 10/10/2012    Priority: Low  . Osteoarthritis 07/03/2007    Priority: Low  . Hypokalemia 05/24/2017  . Hyponatremia 05/24/2017  . Syncope 05/23/2017  . Acute renal failure superimposed on stage 3 chronic kidney disease ([place]) 05/23/2017  . Gout 05/24/2016    Medications- reviewed and updated Current Outpatient Medications  Medication Sig Dispense Refill  . amLODipine (NORVASC) 5 MG tablet Take 1 tablet by mouth every day 90 tablet 1  . atorvastatin (LIPITOR) 40 MG tablet Take 1 tablet by mouth daily 90 tablet 1  . DiphenhydrAMINE HCl, Sleep, (SOMINEX PO) Take 1 tablet by mouth at bedtime.     . pantoprazole (PROTONIX) 40 MG tablet Take 1 tablet (40 mg total) by mouth 2 (two) times daily. (Patient taking differently: Take 40 mg by mouth daily. ) 90 tablet 3   No current facility-administered medications for this visit.     Objective: BP 122/78   Pulse 81   Temp 98.6 F (37 C) (Oral)   Ht 6\' 2"  (1.88 m)   Wt 286 lb 9.6 oz (130 kg)   SpO2 96%   BMI 36.80 kg/m  Gen: NAD, resting comfortably CV: RRR no murmurs rubs or gallops Lungs: CTAB no crackles,  wheeze, rhonchi Abdomen: soft/nontender/nondistended/normal bowel sounds. No rebound or guarding.  Ext: no edema Skin: warm, dry Neuro: grossly normal, moves all extremities  Assessment/Plan:  Other notes: 1.Remains under surveillance at [facility] for urethral and penile cancer.  He is now on a six-month scheduled which she is excited about- in august.   Ingrown nail S: Patient was seen about 10 days ago with recurrent paronychia and associated cellulitis of right great toe.  We placed him on a course of Keflex.  He has been able to secure a visit with podiatry on the 31st.  Today he states had procedure yesterday and is feeling better. antibiotic helped infection and pain  A/P: glad he has improved and had toenail procedure- has follow up  Hypertension s: controlled on amlodipine 5 mg.   BP Readings from Last 3 Encounters:  10/12/18 122/78  10/02/18 (!) 100/56  08/23/18 118/68  A/P: We discussed blood pressure goal of <140/90. Continue current meds   CKD (chronic kidney disease), stage IV ([place]) S:We have also been monitoring his renal function with GFR in the 30s/CKD stage III.  Does not have microalbuminuria. Unfortunately GFR dropped below 30- have referred to renal A/P: Fortunately he had his first visit with [facility] in June.  He will have regular follow-up.  Update kidney function today. He thinks q6 months- will  be getting card  Hyperlipidemia S:  controlled on atorvastatin 40mg  with LDL slightly high in November at 111  A/P: update direct LDL- since hes above age 72 and using for primary prevention- dont feel strongly about increasing rx  Type 2 diabetes mellitus with renal complication (HCC) S: Diet controlled on no medications Exercise and diet- patient had been able to get his weight down to 255 within last 2 years (was during chemotherapy)- unfortunately up another 2 lbs. Not exercising at present. He feels could reduce volume.  Lab Results  Component Value Date    HGBA1C 6.1 04/30/2018   HGBA1C 6.1 10/30/2017   HGBA1C 6.3 11/28/2016   A/P: Update A1c today, encouraged even 5 lbs weight los with improved exercise and healthier eating   Set a goal for exercise of 3x a week walking for 10 minutes for 1 month.    Sleep apnea Got machine in 2002 from [facility]- 7 year replacement needed and he is 2x beyond that. Called [facility] for supplies but couldn't get supplies- so we will [facility] to [facility] to get him set up. Compliant with CPAp 100% of nights for over 10 years. Feels well rested when he uses it. When he doesn't use it- does not feel refreshed- so always uses   Dysphagia S: Patient with history of dysphagia with thick foods.  He had acid related esophagitis on endoscopy.  Plan is for long-term Protonix-has been doing okay on daily treatment A/P:  Stable. Continue current rx unless renal suggests reducing due to CKD  Morbid obesity ([place]) BMI is over 35 with hypertension hyperlipidemia, diabetes.  We discussed some mild lifestyle changes which may help including exercise as per diabetes section and reducing portion size.  Follow-up in 4 to 6 months   Future Appointments  Date Time Provider [place]  10/18/2018  1:45 PM [facility] NURSE [facility]  10/31/2018  9:45 AM Yong Channel, Brayton Mars, MD [facility]   Cancel November visit-moved to 4 to 6 months  Lab/Order associations: Fasting today- black coffee only CKD (chronic kidney disease), stage IV (HCC)  Hyperlipidemia, unspecified hyperlipidemia type - Plan: LDL cholesterol, direct, CBC, Comprehensive metabolic panel  Type 2 diabetes mellitus with stage 4 chronic kidney disease, without long-term current use of insulin ([place]) - Plan: Hemoglobin A1c  Sleep apnea, unspecified type  Dysphagia, unspecified type  Essential hypertension  Morbid obesity ([place])  Return precautions advised.  Garret Reddish, MD

## 2018-10-12 NOTE — Assessment & Plan Note (Addendum)
S:We have also been monitoring his renal function with GFR in the 30s/CKD stage III.  Does not have microalbuminuria. Unfortunately GFR dropped below 30- have referred to renal A/P: Fortunately he had his first visit with [facility] in June.  He will have regular follow-up.  Update kidney function today. He thinks q6 months- will be getting card

## 2018-10-12 NOTE — Assessment & Plan Note (Signed)
BMI is over 35 with hypertension hyperlipidemia, diabetes.  We discussed some mild lifestyle changes which may help including exercise as per diabetes section and reducing portion size.  Follow-up in 4 to 6 months

## 2018-10-12 NOTE — Assessment & Plan Note (Signed)
S:  controlled on atorvastatin 40mg  with LDL slightly high in November at 111  A/P: update direct LDL- since hes above age 84 and using for primary prevention- dont feel strongly about increasing rx

## 2018-10-14 DIAGNOSIS — L600 Ingrowing nail: Secondary | ICD-10-CM

## 2018-10-14 HISTORY — DX: Ingrowing nail: L60.0

## 2018-10-14 NOTE — Progress Notes (Signed)
Subjective:   Patient ID: Charles Lasso., male   DOB: 84 y.o.   MRN: [id number]   HPI 84 year old male presents the office with concerns of ingrown toenail to left big toe, lateral aspect which is been ongoing for about 2 months.  A week ago he saw his primary care physician is put on cephalexin which he finished.  He states the area is still ingrown causing discomfort.  She has been on antibiotics he has not had any redness or drainage.  He states he had a similar procedure done on the contralateral extremity.  No other concerns.   Review of Systems  All other systems reviewed and are negative.  Past Medical History:  Diagnosis Date  . Arthritis   . BPH (benign prostatic hypertrophy) with urinary obstruction   . Cancer ([place])    urethral   . CKD (chronic kidney disease), stage III ([place])   . Diet-controlled type 2 diabetes mellitus ([place])   . History of cellulitis    2012-- left lower extremitiy  . History of urinary tract part removal    2008--  partial urethrectomy for SCC  . Hyperlipidemia   . Hypertension   . Organic impotence   . OSA on CPAP    mild to moderate per study 04-19-2011  . Penile lesion   . Sleep apnea    CPAP use    Past Surgical History:  Procedure Laterality Date  . CYSTO/  BILATERAL RETROGRADE PYELOGRAM/  RESECTION PROSTATIC URETHRAL TUMOR/  EXCISION BIOPSY URETHRAL MEATUS AND MEATOTOMY/  TRANSRECTAL ULTRASOUND PROSTATE BIOPSY'S  12-05-2006  . CYSTOSCOPY N/A 06/18/2015   Procedure: CYSTOSCOPY FLEXIBLE;  Surgeon: Irine Seal, MD;  Location[facility];  Service: Urology;  Laterality: N/A;  . PENILE BIOPSY N/A 06/18/2015   Procedure: PENILE BIOPSY;  Surgeon: Irine Seal, MD;  Location: [facility];  Service: Urology;  Laterality: N/A;  . URETHRECTOMY  05-01-2007   Partial  (squamous cell carcinoma )     Current Outpatient Medications:  .  amLODipine (NORVASC) 5 MG tablet, Take 1 tablet by mouth every day, Disp: 90 tablet,  Rfl: 1 .  atorvastatin (LIPITOR) 40 MG tablet, Take 1 tablet by mouth daily, Disp: 90 tablet, Rfl: 1 .  DiphenhydrAMINE HCl, Sleep, (SOMINEX PO), Take 1 tablet by mouth at bedtime. , Disp: , Rfl:  .  pantoprazole (PROTONIX) 40 MG tablet, Take 1 tablet (40 mg total) by mouth 2 (two) times daily. (Patient taking differently: Take 40 mg by mouth daily. ), Disp: 90 tablet, Rfl: 3  No Known Allergies       Objective:  Physical Exam  General: AAO x3, NAD  Dermatological: Incurvation present to the lateral aspect the left hallux toenail with tenderness palpation.  Minimal edema to the area and there is no significant erythema there is no ascending cellulitis.  There is no fluctuation or crepitation or any malodor.  No open lesions.  Vascular: Dorsalis Pedis artery and Posterior Tibial artery pedal pulses are 2/4 bilateral with immedate capillary fill time. Pedal hair growth present. No varicosities and no lower extremity edema present bilateral. There is no pain with calf compression, swelling, warmth, erythema.   Neruologic: Grossly intact via light touch bilateral. Protective threshold with Semmes Wienstein monofilament intact to all pedal sites bilateral.   Musculoskeletal: No gross boney pedal deformities bilateral. No pain, crepitus, or limitation noted with foot and ankle range of motion bilateral. Muscular strength 5/5 in all groups tested bilateral.  Assessment:   Left lateral hallux symptomatic ingrown toenail    Plan:  -Treatment options discussed including all alternatives, risks, and complications -Etiology of symptoms were discussed At this time, the patient is requesting partial nail removal with chemical matricectomy to the symptomatic portion of the nail. Risks and complications were discussed with the patient for which they understand and written consent was obtained. Under sterile conditions a total of 3 mL of a mixture of 2% lidocaine plain and 0.5% Marcaine plain was  infiltrated in a hallux block fashion. Once anesthetized, the skin was prepped in sterile fashion. A tourniquet was then applied. Next the lateral aspect of hallux nail border was then sharply excised making sure to remove the entire offending nail border. Once the nails were ensured to be removed area was debrided and the underlying skin was intact. There is no purulence identified in the procedure. Next phenol was then applied under standard conditions and copiously irrigated. Silvadene was applied. A dry sterile dressing was applied. After application of the dressing the tourniquet was removed and there is found to be an immediate capillary refill time to the digit. The patient tolerated the procedure well any complications. Post procedure instructions were discussed the patient for which he verbally understood. Follow-up in one week for nail check or sooner if any problems are to arise. Discussed signs/symptoms of infection and directed to call the office immediately should any occur or go directly to the emergency room. In the meantime, encouraged to call the office with any questions, concerns, changes symptoms.  Trula Slade DPM

## 2018-10-16 ENCOUNTER — Telehealth: Payer: Self-pay | Admitting: *Deleted

## 2018-10-16 NOTE — Telephone Encounter (Signed)
Order and OV note faxed

## 2018-10-16 NOTE — Telephone Encounter (Signed)
Copied from [place] [id number]. Topic: Quick Communication - See Telephone Encounter >> Oct 16, 2018  9:47 AM Antonieta Iba C wrote: CRM for notification. See Telephone encounter for: 10/16/18.  Pt says that he was seen and advised by CMA that she is faxing Rx for C-Pap machine to [facility]. Pt says that he spoke with [facility] and they have not received Rx.   Pt is requesting a call back.   [facility] fax# [phone]

## 2018-10-18 ENCOUNTER — Ambulatory Visit (INDEPENDENT_AMBULATORY_CARE_PROVIDER_SITE_OTHER): Payer: Self-pay

## 2018-10-18 DIAGNOSIS — L600 Ingrowing nail: Secondary | ICD-10-CM

## 2018-10-18 MED ORDER — CEPHALEXIN 500 MG PO CAPS: 500.0000 mg | ORAL_CAPSULE | Freq: Two times a day (BID) | ORAL | 0 refills | Status: DC

## 2018-10-18 NOTE — Patient Instructions (Signed)

## 2018-10-19 ENCOUNTER — Ambulatory Visit: Payer: Self-pay

## 2018-10-19 NOTE — Telephone Encounter (Signed)
Patient called in with c/o "urinary frequency." He says "it all started yesterday. I have frequency, going to urinate every 1-2 hours and dribbling on the way. I also feel off balance, not dizzy." I asked about pain, he denies. I asked about other symptoms, he says "I had a fever 100 this morning, but it's down to normal now. I took a Tylenol. I don't have any other symptoms except feeling off balance." According to protocol, see PCP within 24 hours, no availability with PCP, appointment scheduled for tomorrow at 0945 with Dr. Rogers Blocker at [facility], care advice given, patient verbalized understanding.   Reason for Disposition . Urinating more frequently than usual (i.e., frequency)  Answer Assessment - Initial Assessment Questions 1. SYMPTOM: "What's the main symptom you're concerned about?" (e.g., frequency, incontinence)     Frequency 2. ONSET: "When did the frequency start?"     Yesterday 3. PAIN: "Is there any pain?" If so, ask: "How bad is it?" (Scale: 1-10; mild, moderate, severe)     No 4. CAUSE: "What do you think is causing the symptoms?"     Maybe a urinary infection, but I don't know 5. OTHER SYMPTOMS: "Do you have any other symptoms?" (e.g., fever, flank pain, blood in urine, pain with urination)     Fever 100 this morning, off balance 6. PREGNANCY: "Is there any chance you are pregnant?" "When was your last menstrual period?"     N/A  Protocols used: URINARY [facility]

## 2018-10-20 ENCOUNTER — Encounter: Payer: Self-pay | Admitting: Family Medicine

## 2018-10-20 ENCOUNTER — Other Ambulatory Visit (HOSPITAL_COMMUNITY)
Admission: RE | Admit: 2018-10-20 | Discharge: 2018-10-20 | Disposition: A | Discharge status: Home / Self Care | Payer: Medicare Other | Source: Other Acute Inpatient Hospital | Attending: Family Medicine | Admitting: Family Medicine

## 2018-10-20 ENCOUNTER — Ambulatory Visit (INDEPENDENT_AMBULATORY_CARE_PROVIDER_SITE_OTHER): Payer: Medicare Other | Admitting: Family Medicine

## 2018-10-20 VITALS — BP 118/64 | HR 103 | Temp 98.1°F | Wt 284.0 lb

## 2018-10-20 DIAGNOSIS — R3129 Other microscopic hematuria: Secondary | ICD-10-CM | POA: Diagnosis not present

## 2018-10-20 DIAGNOSIS — R350 Frequency of micturition: Secondary | ICD-10-CM

## 2018-10-20 LAB — POCT URINALYSIS DIPSTICK
BILIRUBIN UA: NEGATIVE
Glucose, UA: NEGATIVE
KETONES UA: NEGATIVE
Leukocytes, UA: NEGATIVE
NITRITE UA: NEGATIVE
PH UA: 6 (ref 5.0–8.0)
PROTEIN UA: POSITIVE — AB
SPEC GRAV UA: 1.02 (ref 1.010–1.025)
UROBILINOGEN UA: 0.2 U/dL

## 2018-10-20 NOTE — Progress Notes (Signed)
Patient: Charles Castillo. MRN: [id number] DOB: 09-14-1934 PCP: Marin Olp, MD     Subjective:  Chief Complaint  Patient presents with  . Urinary Frequency    for a couple weeks off and on.     HPI: The patient is a 84 y.o. male who presents today for urinary urgency and frequency. He states he can't control his urine. He is going about every hour. If he doesn't go every hour he loses control of his bladder. He states he has no blood in his urine. He states it does have a smell to it. No change in color to his urine. He has no dysuria, stomach pain or flank pain. No fever/chills. He does have a history of an enlarged prostate and urethral cancer. He goes to [facility] for this. He was seen in August with normal MRI and check up. He is due for follow up with them in 6 months. His wife states he just went down hill about 2 days ago and won't get out of bed and his balance is off. Labs checked on 10/25 with normal cbc and stable CKD. He is currently on keflex 500mg  BID.   Review of Systems  Constitutional: Positive for fatigue. Negative for chills, diaphoresis and fever.  Respiratory: Negative for cough and shortness of breath.   Cardiovascular: Negative for chest pain and palpitations.  Gastrointestinal: Negative for abdominal pain, constipation, diarrhea and nausea.  Genitourinary: Positive for frequency and urgency. Negative for dysuria, flank pain and hematuria.    Allergies Patient has No Known Allergies.  Past Medical History Patient  has a past medical history of Arthritis, BPH (benign prostatic hypertrophy) with urinary obstruction, Cancer ([place]), CKD (chronic kidney disease), stage III ([place]), Diet-controlled type 2 diabetes mellitus ([place]), History of cellulitis, History of urinary tract part removal, Hyperlipidemia, Hypertension, Organic impotence, OSA on CPAP, Penile lesion, and Sleep apnea.  Surgical History Patient  has a past surgical history that includes Urethrectomy  (05-01-2007); CYSTO/  BILATERAL RETROGRADE PYELOGRAM/  RESECTION PROSTATIC URETHRAL TUMOR/  EXCISION BIOPSY URETHRAL MEATUS AND MEATOTOMY/  TRANSRECTAL ULTRASOUND PROSTATE BIOPSY'S (12-05-2006); Penile biopsy (N/A, 06/18/2015); and Cystoscopy (N/A, 06/18/2015).  Family History Pateint's family history includes Heart failure in his father.  Social History Patient  reports that he quit smoking about 49 years ago. His smoking use included cigarettes. He has a 25.00 pack-year smoking history. He has never used smokeless tobacco. He reports that he does not drink alcohol or use drugs.    Objective: Vitals:   10/20/18 0946  BP: 118/64  Pulse: (!) 103  Temp: 98.1 F (36.7 C)  TempSrc: Oral  SpO2: 95%  Weight: 284 lb (128.8 kg)    Body mass index is 36.46 kg/m.  Physical Exam  Constitutional: He appears well-developed and well-nourished.  HENT:  Right Ear: External ear normal.  Left Ear: External ear normal.  Mouth/Throat: Oropharynx is clear and moist.  Neck: Normal range of motion. Neck supple.  Cardiovascular: Normal rate, regular rhythm and normal heart sounds.  Pulmonary/Chest: Effort normal and breath sounds normal.  Abdominal: Soft. Bowel sounds are normal.  Musculoskeletal:  No cva tenderness   Vitals reviewed.  Ua: 3+ blood, 2+ protein, no leuk, no nitrite     Assessment/plan: 1. Urinary frequency He is on keflex BID and has had these symptoms on and off for a few weeks. Discussed with them that the blood in the urine is not normal and would need bigger work up than I can do. Would  like to check PSA on him and repeat urine with micro to confirm the hematuria. UA not impressive for infection and already on keflex renally dosed for him. Could obscure his culture results, but Im going to continue him on keflex and wait for urine culture to come back before treating him with something else. I want him to follow up closely with his PCP next week. His culture will be back from his  urine and we can repeat UA. Discussed if continues to have blood in urine without infection needs to see urology especially with such drastic change in his urinary habits. Have some cancer with his hx of urethral cancer and BPH. Was seen by [facility] in August though. Will see Dr. Yong Channel next week and strict ER precautions given over the weekend.  - POCT urinalysis dipstick - Urine Culture; Future    Return in about 2 days (around 10/22/2018) for with pcp for further work up .    Orma Flaming, MD [place]   10/20/2018

## 2018-10-20 NOTE — Patient Instructions (Signed)
Call on Monday to see dr. Yong Channel....  i'll call you for follow up on urine culture Continue keflex  Fever/confusion/pain go to ER

## 2018-10-21 LAB — URINE CULTURE: Culture: NO GROWTH

## 2018-10-22 ENCOUNTER — Telehealth: Payer: Self-pay | Admitting: *Deleted

## 2018-10-22 ENCOUNTER — Encounter: Payer: Self-pay | Admitting: Family Medicine

## 2018-10-22 ENCOUNTER — Ambulatory Visit (INDEPENDENT_AMBULATORY_CARE_PROVIDER_SITE_OTHER): Payer: Medicare Other | Admitting: Family Medicine

## 2018-10-22 VITALS — BP 96/60 | HR 84 | Temp 98.2°F | Ht 74.0 in | Wt 280.8 lb

## 2018-10-22 DIAGNOSIS — I10 Essential (primary) hypertension: Secondary | ICD-10-CM | POA: Diagnosis not present

## 2018-10-22 DIAGNOSIS — R5383 Other fatigue: Secondary | ICD-10-CM

## 2018-10-22 DIAGNOSIS — R351 Nocturia: Secondary | ICD-10-CM | POA: Diagnosis not present

## 2018-10-22 DIAGNOSIS — R011 Cardiac murmur, unspecified: Secondary | ICD-10-CM | POA: Diagnosis not present

## 2018-10-22 LAB — CBC WITH DIFFERENTIAL/PLATELET
Basophils Absolute: 0.1 10*3/uL (ref 0.0–0.1)
Basophils Relative: 1.1 % (ref 0.0–3.0)
EOS PCT: 3.6 % (ref 0.0–5.0)
Eosinophils Absolute: 0.2 10*3/uL (ref 0.0–0.7)
HCT: 42.6 % (ref 39.0–52.0)
HEMOGLOBIN: 14.3 g/dL (ref 13.0–17.0)
LYMPHS PCT: 20.9 % (ref 12.0–46.0)
Lymphs Abs: 1.2 10*3/uL (ref 0.7–4.0)
MCHC: 33.5 g/dL (ref 30.0–36.0)
MCV: 90.2 fl (ref 78.0–100.0)
Monocytes Absolute: 1.2 10*3/uL — ABNORMAL HIGH (ref 0.1–1.0)
Neutro Abs: 3.1 10*3/uL (ref 1.4–7.7)
Neutrophils Relative %: 53.2 % (ref 43.0–77.0)
Platelets: 198 10*3/uL (ref 150.0–400.0)
RBC: 4.72 Mil/uL (ref 4.22–5.81)
RDW: 14.8 % (ref 11.5–15.5)
WBC: 5.8 10*3/uL (ref 4.0–10.5)

## 2018-10-22 LAB — URINALYSIS, ROUTINE W REFLEX MICROSCOPIC
Bilirubin Urine: NEGATIVE
KETONES UR: NEGATIVE
LEUKOCYTES UA: NEGATIVE
Nitrite: NEGATIVE
SPECIFIC GRAVITY, URINE: 1.02 (ref 1.000–1.030)
TOTAL PROTEIN, URINE-UPE24: 100 — AB
URINE GLUCOSE: NEGATIVE
UROBILINOGEN UA: 0.2 (ref 0.0–1.0)
pH: 6 (ref 5.0–8.0)

## 2018-10-22 LAB — COMPREHENSIVE METABOLIC PANEL
ALBUMIN: 3.7 g/dL (ref 3.5–5.2)
ALT: 63 U/L — ABNORMAL HIGH (ref 0–53)
AST: 49 U/L — ABNORMAL HIGH (ref 0–37)
Alkaline Phosphatase: 109 U/L (ref 39–117)
BUN: 35 mg/dL — ABNORMAL HIGH (ref 6–23)
CALCIUM: 9.3 mg/dL (ref 8.4–10.5)
CO2: 20 mEq/L (ref 19–32)
Chloride: 107 mEq/L (ref 96–112)
Creatinine, Ser: 2.7 mg/dL — ABNORMAL HIGH (ref 0.40–1.50)
GFR: 24.17 mL/min — AB (ref >60.00)
Glucose, Bld: 119 mg/dL — ABNORMAL HIGH (ref 70–99)
POTASSIUM: 4.7 meq/L (ref 3.5–5.1)
Sodium: 138 mEq/L (ref 135–145)
Total Bilirubin: 0.7 mg/dL (ref 0.2–1.2)
Total Protein: 7 g/dL (ref 6.0–8.3)

## 2018-10-22 LAB — PSA
PSA: 19.2
PSA: 19.2 ng/mL — ABNORMAL HIGH (ref 0.10–4.00)

## 2018-10-22 LAB — TSH: TSH: 2.4 u[IU]/mL (ref 0.35–4.50)

## 2018-10-22 NOTE — Telephone Encounter (Signed)
Copied from [place] [id number]. Topic: Quick Communication - See Telephone Encounter >> Oct 16, 2018  9:47 AM Antonieta Iba C wrote: CRM for notification. See Telephone encounter for: 10/16/18.  Pt says that he was seen and advised by CMA that she is faxing Rx for C-Pap machine to [place]. Pt says that he spoke with [facility] and they have not received Rx.   Pt is requesting a call back.   [facility] fax# [phone] >> Oct 22, 2018  1:20 PM [place], Fraser Din wrote: Pt called and would like a call back about cpap machine. Pt has another fax for [facility] Fax [phone]. Please advise

## 2018-10-22 NOTE — Progress Notes (Signed)
Subjective:  Charles Castillo. is a 84 y.o. year old very pleasant male patient who presents for/with See problem oriented charting ROS- no chest pain or shortness of breath. Patient does have urinary urgency, frequency, incontinence.    Past Medical History-  Patient Active Problem List   Diagnosis Date Noted  . Type 2 diabetes mellitus with renal complication (HCC) 95/28/4132    Priority: High  . Dysphagia 10/30/2017    Priority: Medium  . Erectile dysfunction 09/15/2014    Priority: Medium  . 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 CKD (chronic kidney disease), stage IV ([place]) 03/24/2008    Priority: Medium  . Hyperlipidemia 08/27/2007    Priority: Medium  . Essential hypertension 07/03/2007    Priority: Medium  . Sleep apnea 07/03/2007    Priority: Medium  . Former smoker 09/15/2014    Priority: Low  . Morbid obesity ([place]) 10/10/2012    Priority: Low  . Osteoarthritis 07/03/2007    Priority: Low  . Ingrown toenail 10/14/2018  . Hypokalemia 05/24/2017  . Hyponatremia 05/24/2017  . Syncope 05/23/2017  . Acute renal failure superimposed on stage 3 chronic kidney disease ([place]) 05/23/2017  . Gout 05/24/2016    Medications- reviewed and updated Current Outpatient Medications  Medication Sig Dispense Refill  . amLODipine (NORVASC) 5 MG tablet Take 1 tablet by mouth every day 90 tablet 1  . atorvastatin (LIPITOR) 40 MG tablet Take 1 tablet by mouth daily 90 tablet 1  . cephALEXin (KEFLEX) 500 MG capsule Take 1 capsule (500 mg total) by mouth 2 (two) times daily. 14 capsule 0  . DiphenhydrAMINE HCl, Sleep, (SOMINEX PO) Take 1 tablet by mouth at bedtime.     . pantoprazole (PROTONIX) 40 MG tablet Take 1 tablet (40 mg total) by mouth 2 (two) times daily. (Patient taking differently: Take 40 mg by mouth daily. ) 90 tablet 3   No current facility-administered medications for this visit.     Objective: BP 96/60 (BP Location: Left Arm, Patient Position:  Sitting, Cuff Size: Large)   Pulse 84   Temp 98.2 F (36.8 C) (Oral)   Ht 6\' 2"  (1.88 m)   Wt 280 lb 12.8 oz (127.4 kg)   SpO2 96%   BMI 36.05 kg/m  Gen: NAD, resting comfortably CV: RRR. Noted SEM. No  rubs or gallops Lungs: CTAB no crackles, wheeze, rhonchi Abdomen: soft/nontender/nondistended/normal bowel sounds. No rebound or guarding.  Ext: trace edema Skin: warm, dry Neuro: CN II-XII intact, sensation and reflexes normal throughout, 5/5 muscle strength in bilateral upper and lower extremities. Normal finger to nose. Normal rapid alternating movements. No pronator drift. Normal romberg. Normal gait.   Assessment/Plan:  Fatigue, unspecified type - Plan: Comprehensive metabolic panel, CBC with Differential/Platelet, TSH, Urinalysis, Urinalysis Polyuria/Nocturia/urinary urgency - Plan: PSA, Urinalysis, Urinalysis S: Patient presented to Saturday clinic with urinary frequency as well as urgency.  He has been having trouble with incontinence.  If he does not go every hour and he has incontinence.  He denies gross blood in his urine but there was some possible microscopic hematuria on dipstick.  He is on Keflex 500 mg twice a day at baseline for UTi prevention. His primary urologist is Dr. Jeffie Pollock. He follows with [facility] for urethral cancer-last seen in August with normal MRI and checkup  He also complained of trouble getting out of bed and being off balance at that visit on Saturday- see hypertension section. Balance issues from Saturday have improved.  Today, He states is feeling somewhat better. Now it is every 2-3 hours and he can tolerate this. The incontinence is really bothering him still though. He feels tired and run down. No shortness of breath or chest pain. No increased edema. Has been told enlarged prostate at [place] at past.   Wife has heard fair amount of cough- wonders about allergies.no sputum production. He states cough has been minimal.   A/P: 84 year old male presenting with  fatigue, polyuria, nocturia, urinary urgency with negative urine culture over the weekend. BPH on exam but not boggy and not tender- do not suspect prostatitis. PSA tested due to how symptomatic he is and up to nearly 20- I have asked him to return to see his primary urologist Dr. Jeffie Pollock.   Other labs largley reassuring. He does have some fatigue and noted a murmur on exam- will get updated echocardiogram with none on file though he states has had for 10 years.   Essential hypertension S: controlled on amlodipine 5 mg but having some lightheadedness/dizziness particularly with position changes BP Readings from Last 3 Encounters:  10/22/18 96/60  10/20/18 118/64  10/12/18 122/78  A/P: We discussed blood pressure goal of <140/90. Has home cuff he can monitor- he agrees to restart amlodipine if BP gets >140/90 but to hold for now due to orthostatic symptoms      Future Appointments  Date Time Provider [place]  11/02/2018  1:15 PM [facility] NURSE [facility]  04/10/2019  9:40 AM Yong Channel, Brayton Mars, MD [facility]   Lab/Order associations: Fatigue, unspecified type - Plan: Comprehensive metabolic panel, CBC with Differential/Platelet, TSH, Urinalysis, Urinalysis, ECHOCARDIOGRAM COMPLETE  Nocturia - Plan: PSA, Urinalysis, Urinalysis  Essential hypertension  Murmur - Plan: ECHOCARDIOGRAM COMPLETE  Return precautions advised.  Garret Reddish, MD

## 2018-10-22 NOTE — Patient Instructions (Addendum)
Glad you are feeling some better but I still want to investigate further  Please stop by lab before you go  If we do not find an obvious cause of your symptoms on labs would want to get you back in with [facility]- some labs may point [place] in that direction anyway.  We will await results

## 2018-10-22 NOTE — Assessment & Plan Note (Signed)
S: controlled on amlodipine 5 mg but having some lightheadedness/dizziness particularly with position changes BP Readings from Last 3 Encounters:  10/22/18 96/60  10/20/18 118/64  10/12/18 122/78  A/P: We discussed blood pressure goal of <140/90. Has home cuff he can monitor- he agrees to restart amlodipine if BP gets >140/90 but to hold for now due to orthostatic symptoms

## 2018-10-23 ENCOUNTER — Other Ambulatory Visit: Payer: Self-pay

## 2018-10-23 DIAGNOSIS — R972 Elevated prostate specific antigen [PSA]: Secondary | ICD-10-CM

## 2018-10-23 NOTE — Telephone Encounter (Signed)
Faxed over prescription and OV note to [facility] after calling to get the fax number. I responded to patient to let him know via My Chart as we were discussing via My Chart.

## 2018-10-23 NOTE — Telephone Encounter (Signed)
Pt no longer want to use [facility] for his [facility]ap supplies. Pt would like to get his supplies for [facility] phone [phone]. Please call and get fax number. Pt would like a callback once rx has been fax. Per pt [facility] never received rx

## 2018-10-25 ENCOUNTER — Encounter: Payer: Self-pay | Admitting: Family Medicine

## 2018-10-25 ENCOUNTER — Ambulatory Visit (HOSPITAL_COMMUNITY): Payer: Medicare Other | Attending: Cardiovascular Disease

## 2018-10-25 ENCOUNTER — Other Ambulatory Visit: Payer: Self-pay

## 2018-10-25 DIAGNOSIS — R5383 Other fatigue: Secondary | ICD-10-CM

## 2018-10-25 DIAGNOSIS — R011 Cardiac murmur, unspecified: Secondary | ICD-10-CM

## 2018-10-25 DIAGNOSIS — I350 Nonrheumatic aortic (valve) stenosis: Secondary | ICD-10-CM | POA: Insufficient documentation

## 2018-10-26 ENCOUNTER — Telehealth: Payer: Self-pay | Admitting: Family Medicine

## 2018-10-26 NOTE — Telephone Encounter (Signed)
Colletta Maryland can you please send the sleep study notes and office notes.  Roselyn Reef can you advise on the new machine  Copied from [place] [id number]. Topic: General - Inquiry >> Oct 26, 2018 10:22 AM Conception Chancy, NT wrote: Reason for CRM: Jonelle Sidle is calling from [facility] and is requesting the sleep study results and office notes before the sleep study. She states the patient is requesting a new machine.   Fax# [phone] Cb# [phone]

## 2018-10-29 NOTE — Telephone Encounter (Signed)
Tiffany calling back and stated that she did not get correct office note. She states that she will need office notes from before sleep study and sleep study notes. Please advise Cb# [phone] Fax# [phone]

## 2018-10-30 ENCOUNTER — Telehealth: Payer: Self-pay | Admitting: Family Medicine

## 2018-10-30 NOTE — Telephone Encounter (Signed)
Copied from [place] [id number]. Topic: General - Other >> Oct 30, 2018  9:36 AM Keene Breath wrote: Reason for CRM: Barbaraann Rondo with [facility] received a request for a C-pap that states use current pressure setting.  He stated that he cannot accepting this wording and needs to know the specific pressure setting in order to issue the C-pap.  Please advise.  CB# [phone] ,ext. [phone]

## 2018-10-30 NOTE — Progress Notes (Signed)
Patient is here today for follow-up appointment, recent procedure performed on 10/11/2018, removal of ingrown nail left lateral hallux.  He states that overall he feels like it is healing well, but it is a little tender.  Noted redness at the base of the nail, with rash//inflamed skin.  This inflammatory response appears to be coming from the way that he bandaged and wrapped the toe.  The area was beginning to scab over but there was some serosanguineous drainage still present at the base of the toe.  Dr. Carman Ching came in to evaluate the toe and prescribed Keflex twice daily for 7 days.  Discussed signs and symptoms of infection with patient.  Verbal and written instructions were given to the patient, he is to follow-up in 2 weeks if there is no improvement in his toe or sooner if acute symptoms arise.

## 2018-10-31 ENCOUNTER — Ambulatory Visit: Payer: Medicare Other | Admitting: Family Medicine

## 2018-10-31 NOTE — Telephone Encounter (Signed)
-----   Message from [place] sent at 10/30/2018  9:53 AM EST -----  [facility] got in touch with him this morning.  All they need is someone to contact their office with the pressure setting for the CPAP machine ( he said its 6 ).  They also told him that if we didn't have that information, then they would give him a machine with an automatic setting which is what he would rather have.  [place] [phone]   Let me know if I can do anything to help.   He wants to hold off on the sleep study referral because it doesn't look like he will need one with [facility].

## 2018-10-31 NOTE — Telephone Encounter (Signed)
Called and spoke to [place] at [facility]. I faxed over a prescription that states Auto CPAP unit with pressures 4-20 with 2 week download to [phone].

## 2018-10-31 NOTE — Telephone Encounter (Signed)
Charles Castillo is calling and requesting Charles Castillo to give him a call.   [phone] ext [phone]

## 2018-10-31 NOTE — Telephone Encounter (Signed)
Called and left a voicemail message asking for a return phone call as Barbaraann Rondo leaves at 4:00pm.

## 2018-10-31 NOTE — Telephone Encounter (Signed)
See note

## 2018-11-01 DIAGNOSIS — N3000 Acute cystitis without hematuria: Secondary | ICD-10-CM | POA: Diagnosis not present

## 2018-11-01 DIAGNOSIS — E119 Type 2 diabetes mellitus without complications: Secondary | ICD-10-CM | POA: Diagnosis not present

## 2018-11-01 DIAGNOSIS — R8271 Bacteriuria: Secondary | ICD-10-CM | POA: Diagnosis not present

## 2018-11-01 NOTE — Telephone Encounter (Signed)
Barbaraann Rondo with [facility] called back to say that they need Dr Yong Channel NPI added to Rx in order for them to get it out to the patient. Any questions please call. Ph# [phone] ext.Marland [phone]

## 2018-11-01 NOTE — Telephone Encounter (Signed)
Barbaraann Rondo with [facility] called back to speak to Colletta Maryland  say that they need Dr Yong Channel NPI added to Rx for the CPAP machine  in order for them to get it out to the patient. Any questions please call. Ph# [phone] ext.Marland [phone]

## 2018-11-02 ENCOUNTER — Other Ambulatory Visit: Payer: Medicare Other

## 2018-11-02 LAB — HM DIABETES EYE EXAM

## 2018-11-05 ENCOUNTER — Encounter: Payer: Self-pay | Admitting: Family Medicine

## 2018-11-05 ENCOUNTER — Other Ambulatory Visit: Payer: Self-pay

## 2018-11-14 DIAGNOSIS — N3000 Acute cystitis without hematuria: Secondary | ICD-10-CM | POA: Diagnosis not present

## 2018-11-17 ENCOUNTER — Encounter: Payer: Self-pay | Admitting: Family Medicine

## 2018-11-27 ENCOUNTER — Emergency Department (HOSPITAL_COMMUNITY): Payer: Medicare Other

## 2018-11-27 ENCOUNTER — Ambulatory Visit: Payer: Medicare Other | Admitting: Physician Assistant

## 2018-11-27 ENCOUNTER — Telehealth: Payer: Self-pay | Admitting: *Deleted

## 2018-11-27 ENCOUNTER — Emergency Department (HOSPITAL_COMMUNITY)
Admission: EM | Admit: 2018-11-27 | Discharge: 2018-11-27 | Disposition: A | Discharge status: Home / Self Care | Payer: Medicare Other | Attending: Emergency Medicine | Admitting: Emergency Medicine

## 2018-11-27 ENCOUNTER — Encounter (HOSPITAL_COMMUNITY): Payer: Self-pay | Admitting: Emergency Medicine

## 2018-11-27 DIAGNOSIS — Z79899 Other long term (current) drug therapy: Secondary | ICD-10-CM | POA: Diagnosis not present

## 2018-11-27 DIAGNOSIS — E860 Dehydration: Secondary | ICD-10-CM | POA: Diagnosis not present

## 2018-11-27 DIAGNOSIS — N183 Chronic kidney disease, stage 3 (moderate): Secondary | ICD-10-CM | POA: Insufficient documentation

## 2018-11-27 DIAGNOSIS — I129 Hypertensive chronic kidney disease with stage 1 through stage 4 chronic kidney disease, or unspecified chronic kidney disease: Secondary | ICD-10-CM | POA: Insufficient documentation

## 2018-11-27 DIAGNOSIS — I959 Hypotension, unspecified: Secondary | ICD-10-CM | POA: Diagnosis not present

## 2018-11-27 DIAGNOSIS — R112 Nausea with vomiting, unspecified: Secondary | ICD-10-CM | POA: Insufficient documentation

## 2018-11-27 DIAGNOSIS — R109 Unspecified abdominal pain: Secondary | ICD-10-CM | POA: Insufficient documentation

## 2018-11-27 DIAGNOSIS — E119 Type 2 diabetes mellitus without complications: Secondary | ICD-10-CM | POA: Diagnosis not present

## 2018-11-27 DIAGNOSIS — Z87891 Personal history of nicotine dependence: Secondary | ICD-10-CM | POA: Insufficient documentation

## 2018-11-27 LAB — I-STAT CHEM 8, ED
BUN: 35 mg/dL — ABNORMAL HIGH (ref 8–23)
Calcium, Ion: 1.16 mmol/L (ref 1.15–1.40)
Chloride: 110 mmol/L (ref 98–111)
Creatinine, Ser: 2.5 mg/dL — ABNORMAL HIGH (ref 0.61–1.24)
GLUCOSE: 129 mg/dL — AB (ref 70–99)
HCT: 40 % (ref 39.0–52.0)
Hemoglobin: 13.6 g/dL (ref 13.0–17.0)
Potassium: 4.7 mmol/L (ref 3.5–5.1)
Sodium: 142 mmol/L (ref 135–145)
TCO2: 22 mmol/L (ref 22–32)

## 2018-11-27 LAB — URINALYSIS, ROUTINE W REFLEX MICROSCOPIC
Bacteria, UA: NONE SEEN
Bilirubin Urine: NEGATIVE
Glucose, UA: NEGATIVE mg/dL
KETONES UR: NEGATIVE mg/dL
Leukocytes, UA: NEGATIVE
NITRITE: NEGATIVE
Protein, ur: 30 mg/dL — AB
Specific Gravity, Urine: 1.018 (ref 1.005–1.030)
pH: 5 (ref 5.0–8.0)

## 2018-11-27 LAB — CBC WITH DIFFERENTIAL/PLATELET
Abs Immature Granulocytes: 0.05 10*3/uL (ref 0.00–0.07)
Basophils Absolute: 0 10*3/uL (ref 0.0–0.1)
Basophils Relative: 0 %
Eosinophils Absolute: 0 10*3/uL (ref 0.0–0.5)
Eosinophils Relative: 0 %
HCT: 44.9 % (ref 39.0–52.0)
Hemoglobin: 14.1 g/dL (ref 13.0–17.0)
Immature Granulocytes: 1 %
Lymphocytes Relative: 2 %
Lymphs Abs: 0.2 10*3/uL — ABNORMAL LOW (ref 0.7–4.0)
MCH: 29.4 pg (ref 26.0–34.0)
MCHC: 31.4 g/dL (ref 30.0–36.0)
MCV: 93.5 fL (ref 80.0–100.0)
MONO ABS: 0.7 10*3/uL (ref 0.1–1.0)
Monocytes Relative: 7 %
NEUTROS ABS: 9.1 10*3/uL — AB (ref 1.7–7.7)
Neutrophils Relative %: 90 %
PLATELETS: 163 10*3/uL (ref 150–400)
RBC: 4.8 MIL/uL (ref 4.22–5.81)
RDW: 14.7 % (ref 11.5–15.5)
WBC: 10 10*3/uL (ref 4.0–10.5)
nRBC: 0 % (ref 0.0–0.2)

## 2018-11-27 LAB — COMPREHENSIVE METABOLIC PANEL
ALK PHOS: 72 U/L (ref 38–126)
ALT: 23 U/L (ref 0–44)
AST: 22 U/L (ref 15–41)
Albumin: 3.6 g/dL (ref 3.5–5.0)
Anion gap: 11 (ref 5–15)
BUN: 40 mg/dL — ABNORMAL HIGH (ref 8–23)
CALCIUM: 8.8 mg/dL — AB (ref 8.9–10.3)
CO2: 16 mmol/L — ABNORMAL LOW (ref 22–32)
CREATININE: 2.54 mg/dL — AB (ref 0.61–1.24)
Chloride: 113 mmol/L — ABNORMAL HIGH (ref 98–111)
GFR calc Af Amer: 26 mL/min — ABNORMAL LOW (ref >60)
GFR calc non Af Amer: 23 mL/min — ABNORMAL LOW (ref >60)
Glucose, Bld: 213 mg/dL — ABNORMAL HIGH (ref 70–99)
Potassium: 4.2 mmol/L (ref 3.5–5.1)
SODIUM: 140 mmol/L (ref 135–145)
Total Bilirubin: 1.2 mg/dL (ref 0.3–1.2)
Total Protein: 6.8 g/dL (ref 6.5–8.1)

## 2018-11-27 LAB — PROTIME-INR
INR: 0.97
Prothrombin Time: 12.8 seconds (ref 11.4–15.2)

## 2018-11-27 LAB — I-STAT CG4 LACTIC ACID, ED
Lactic Acid, Venous: 2.74 mmol/L (ref 0.5–1.9)
Lactic Acid, Venous: 2.43 mmol/L (ref 0.5–1.9)

## 2018-11-27 LAB — LIPASE, BLOOD: Lipase: 39 U/L (ref 11–51)

## 2018-11-27 MED ORDER — LACTATED RINGERS IV BOLUS
1000.0000 mL | Freq: Once | INTRAVENOUS | Status: AC
  Administered 2018-11-27: 1000 mL via INTRAVENOUS

## 2018-11-27 MED ORDER — ONDANSETRON HCL 4 MG PO TABS: 4.0000 mg | ORAL_TABLET | Freq: Three times a day (TID) | ORAL | 0 refills | Status: DC | PRN

## 2018-11-27 NOTE — Telephone Encounter (Signed)
Spoke to pt, told him calling to see how often he has vomited today? Pt said 4 times today. Asked pt if he is able to keep anything down? Pt said he has not tried anything. Told pt it is best if he went to the ER to be evaluated per [facility] due to vomiting and other health issues, and dehydration will need IV fluids. Told pt to go to [facility] . Pt verbalized understanding.

## 2018-11-27 NOTE — ED Notes (Signed)
Pt reports sudden onset of nausea and vomiting today, with 4 emesis.  Denies any diarrhea.  He is able to keep fluids down.  He reports mild dizziness.  He is A&O x 4 and in NAD.  Wife is at bedside.  She reports low grade fever at home of 100.0.

## 2018-11-27 NOTE — ED Triage Notes (Signed)
Per pt, states he has been vomiting since 8 am-states he is dehydrated and weak-patient is hypotensive at this time

## 2018-11-28 ENCOUNTER — Telehealth: Payer: Self-pay | Admitting: Internal Medicine

## 2018-11-28 NOTE — Telephone Encounter (Signed)
Patient wanting to know if Dr.Pyrtle wants him to get medication pantoprazole refilled or stop taking it after a certain time.

## 2018-11-28 NOTE — ED Provider Notes (Signed)
[facility] DEPT Provider Note   CSN: [id number] Arrival date & time: 11/27/18  1509     History   Chief Complaint Chief Complaint  Patient presents with  . Emesis  . hypotensive    HPI Charles Castillo. is a 84 y.o. male.  Vomiting and apparently hypotensive at [facility] so sent here for eval. Now tolerating PO.    Emesis   This is a new problem. The current episode started 6 to 12 hours ago. The problem occurs 2 to 4 times per day. The problem has been resolved. The emesis has an appearance of stomach contents. There has been no fever. Pertinent negatives include no abdominal pain, no arthralgias, no chills, no cough, no diarrhea, no fever, no headaches, no sweats and no URI.    Past Medical History:  Diagnosis Date  . Arthritis   . BPH (benign prostatic hypertrophy) with urinary obstruction   . Cancer ([place])    urethral   . CKD (chronic kidney disease), stage III ([place])   . Diet-controlled type 2 diabetes mellitus ([place])   . History of cellulitis    2012-- left lower extremitiy  . History of urinary tract part removal    2008--  partial urethrectomy for SCC  . Hyperlipidemia   . Hypertension   . Organic impotence   . OSA on CPAP    mild to moderate per study 04-19-2011  . Penile lesion   . Sleep apnea    CPAP use    Patient Active Problem List   Diagnosis Date Noted  . Mild aortic stenosis 10/25/2018  . Ingrown toenail 10/14/2018  . Dysphagia 10/30/2017  . Hypokalemia 05/24/2017  . Hyponatremia 05/24/2017  . Syncope 05/23/2017  . Acute renal failure superimposed on stage 3 chronic kidney disease ([place]) 05/23/2017  . Gout 05/24/2016  . Erectile dysfunction 09/15/2014  . Type 2 diabetes mellitus with renal complication ([place]) 63/12/6008  . Former smoker 09/15/2014  . Morbid obesity ([place]) 10/10/2012  . 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 CKD (chronic kidney disease), stage IV ([place]) 03/24/2008  . Hyperlipidemia 08/27/2007  .  Essential hypertension 07/03/2007  . Osteoarthritis 07/03/2007  . Sleep apnea 07/03/2007    Past Surgical History:  Procedure Laterality Date  . CYSTO/  BILATERAL RETROGRADE PYELOGRAM/  RESECTION PROSTATIC URETHRAL TUMOR/  EXCISION BIOPSY URETHRAL MEATUS AND MEATOTOMY/  TRANSRECTAL ULTRASOUND PROSTATE BIOPSY'S  12-05-2006  . CYSTOSCOPY N/A 06/18/2015   Procedure: CYSTOSCOPY FLEXIBLE;  Surgeon: Irine Seal, MD;  Location: [facility];  Service: Urology;  Laterality: N/A;  . PENILE BIOPSY N/A 06/18/2015   Procedure: PENILE BIOPSY;  Surgeon: Irine Seal, MD;  Location: [facility];  Service: Urology;  Laterality: N/A;  . URETHRECTOMY  05-01-2007   Partial  (squamous cell carcinoma )        Home Medications    Prior to Admission medications   Medication Sig Start Date End Date Taking? Authorizing Provider  amLODipine (NORVASC) 5 MG tablet Take 1 tablet by mouth every day 07/05/18  Yes Marin Olp, MD  atorvastatin (LIPITOR) 40 MG tablet Take 1 tablet by mouth daily 09/24/18  Yes Marin Olp, MD  pantoprazole (PROTONIX) 40 MG tablet Take 1 tablet (40 mg total) by mouth 2 (two) times daily. Patient taking differently: Take 40 mg by mouth daily.  02/21/18  Yes Pyrtle, Lajuan Lines, MD  ondansetron (ZOFRAN) 4 MG tablet Take 1 tablet (4 mg total) by mouth every 8 (eight) hours  as needed for nausea or vomiting. 11/27/18   Amman Bartel, Corene Cornea, MD    Family History Family History  Problem Relation Age of Onset  . Heart failure Father        CHF, no history MI  . Colon cancer Neg Hx   . Stomach cancer Neg Hx   . Esophageal cancer Neg Hx     Social History Social History   Tobacco Use  . Smoking status: Former Smoker    Packs/day: 1.00    Years: 25.00    Pack years: 25.00    Types: Cigarettes    Last attempt to quit: 12/19/1968    Years since quitting: 49.9  . Smokeless tobacco: Never Used  Substance Use Topics  . Alcohol use: No    Alcohol/week: 0.0  standard drinks  . Drug use: No     Allergies   Patient has no known allergies.   Review of Systems Review of Systems  Constitutional: Negative for chills and fever.  Respiratory: Negative for cough.   Gastrointestinal: Positive for vomiting. Negative for abdominal pain and diarrhea.  Musculoskeletal: Negative for arthralgias.  Neurological: Negative for headaches.  All other systems reviewed and are negative.    Physical Exam Updated Vital Signs BP 118/72   Pulse 87   Temp 97.9 F (36.6 C) (Oral)   Resp 20   Ht 6\' 2"  (1.88 m)   Wt 128.4 kg   SpO2 98%   BMI 36.34 kg/m   Physical Exam  Constitutional: He is oriented to person, place, and time. He appears well-developed and well-nourished.  HENT:  Head: Normocephalic and atraumatic.  Eyes: Conjunctivae and EOM are normal.  Neck: Normal range of motion.  Cardiovascular: Normal rate.  Pulmonary/Chest: Effort normal. No respiratory distress.  Abdominal: Soft. Bowel sounds are normal. He exhibits no distension.  Musculoskeletal: Normal range of motion.  Neurological: He is alert and oriented to person, place, and time. No cranial nerve deficit.  Skin: Skin is warm and dry.  Nursing note and vitals reviewed.    ED Treatments / Results  Labs (all labs ordered are listed, but only abnormal results are displayed) Labs Reviewed  COMPREHENSIVE METABOLIC PANEL - Abnormal; Notable for the following components:      Result Value   Chloride 113 (*)    CO2 16 (*)    Glucose, Bld 213 (*)    BUN 40 (*)    Creatinine, Ser 2.54 (*)    Calcium 8.8 (*)    GFR calc non Af Amer 23 (*)    GFR calc Af Amer 26 (*)    All other components within normal limits  CBC WITH DIFFERENTIAL/PLATELET - Abnormal; Notable for the following components:   Neutro Abs 9.1 (*)    Lymphs Abs 0.2 (*)    All other components within normal limits  URINALYSIS, ROUTINE W REFLEX MICROSCOPIC - Abnormal; Notable for the following components:   Hgb  urine dipstick SMALL (*)    Protein, ur 30 (*)    All other components within normal limits  I-STAT CG4 LACTIC ACID, ED - Abnormal; Notable for the following components:   Lactic Acid, Venous 2.74 (*)    All other components within normal limits  I-STAT CG4 LACTIC ACID, ED - Abnormal; Notable for the following components:   Lactic Acid, Venous 2.43 (*)    All other components within normal limits  I-STAT CHEM 8, ED - Abnormal; Notable for the following components:   BUN 35 (*)  Creatinine, Ser 2.50 (*)    Glucose, Bld 129 (*)    All other components within normal limits  CULTURE, BLOOD (ROUTINE X 2)  CULTURE, BLOOD (ROUTINE X 2)  PROTIME-INR  LIPASE, BLOOD    EKG None  Radiology Dg Chest 2 View  Result Date: 11/27/2018 CLINICAL DATA:  Hypotensive EXAM: CHEST - 2 VIEW COMPARISON:  12/05/2006 FINDINGS: The heart size and mediastinal contours are within normal limits. Both lungs are clear. Degenerative osteophytes of the spine. IMPRESSION: No active cardiopulmonary disease. Electronically Signed   By: Donavan Foil M.D.   On: 11/27/2018 16:34    Procedures Procedures (including critical care time)  Medications Ordered in ED Medications  lactated ringers bolus 1,000 mL (0 mLs Intravenous Stopped 11/27/18 1815)  lactated ringers bolus 1,000 mL (0 mLs Intravenous Stopped 11/27/18 2159)     Initial Impression / Assessment and Plan / ED Course  I have reviewed the triage vital signs and the nursing notes.  Pertinent labs & imaging results that were available during my care of the patient were reviewed by me and considered in my medical decision making (see chart for details).     Viral cause? Abdomen benign. No vomiting here. Initially a low BP reading but all others well within normal limits. Labs reassuring. Plan for discharge w/ strict return precautions.   Final Clinical Impressions(s) / ED Diagnoses   Final diagnoses:  Nausea and vomiting, intractability of vomiting  not specified, unspecified vomiting type  Dehydration    ED Discharge Orders         Ordered    ondansetron (ZOFRAN) 4 MG tablet  Every 8 hours PRN     11/27/18 2125           Graceann Boileau, Corene Cornea, MD 11/28/18 2304

## 2018-11-29 NOTE — Telephone Encounter (Signed)
Given history of severe ulcerative esophagitis and hiatal hernia I would continue pantoprazole 40 mg daily

## 2018-11-29 NOTE — Telephone Encounter (Signed)
Dr Hilarie Fredrickson, please advise.... Patient should continue medication, correct?

## 2018-11-29 NOTE — Telephone Encounter (Signed)
Patient advised of Dr Vena Rua recommendations to continue pantoprazole. He verbalizes understanding and thanks [place] for the call.

## 2018-12-04 LAB — CULTURE, BLOOD (ROUTINE X 2)
Culture: NO GROWTH
Culture: NO GROWTH
Special Requests: ADEQUATE
Special Requests: ADEQUATE

## 2018-12-13 ENCOUNTER — Other Ambulatory Visit: Payer: Self-pay | Admitting: Internal Medicine

## 2018-12-13 ENCOUNTER — Other Ambulatory Visit: Payer: Self-pay | Admitting: Family Medicine

## 2018-12-13 DIAGNOSIS — R972 Elevated prostate specific antigen [PSA]: Secondary | ICD-10-CM | POA: Diagnosis not present

## 2018-12-24 DIAGNOSIS — E1122 Type 2 diabetes mellitus with diabetic chronic kidney disease: Secondary | ICD-10-CM | POA: Diagnosis not present

## 2018-12-24 DIAGNOSIS — I129 Hypertensive chronic kidney disease with stage 1 through stage 4 chronic kidney disease, or unspecified chronic kidney disease: Secondary | ICD-10-CM | POA: Diagnosis not present

## 2018-12-24 DIAGNOSIS — N184 Chronic kidney disease, stage 4 (severe): Secondary | ICD-10-CM | POA: Diagnosis not present

## 2018-12-24 DIAGNOSIS — C680 Malignant neoplasm of urethra: Secondary | ICD-10-CM | POA: Diagnosis not present

## 2019-01-02 DIAGNOSIS — R972 Elevated prostate specific antigen [PSA]: Secondary | ICD-10-CM | POA: Diagnosis not present

## 2019-01-02 DIAGNOSIS — N3000 Acute cystitis without hematuria: Secondary | ICD-10-CM | POA: Diagnosis not present

## 2019-01-14 ENCOUNTER — Ambulatory Visit (INDEPENDENT_AMBULATORY_CARE_PROVIDER_SITE_OTHER): Payer: Medicare Other | Admitting: Family Medicine

## 2019-01-14 ENCOUNTER — Encounter: Payer: Self-pay | Admitting: Family Medicine

## 2019-01-14 VITALS — BP 132/76 | HR 72 | Temp 97.9°F | Ht 74.0 in | Wt 285.2 lb

## 2019-01-14 DIAGNOSIS — Z6836 Body mass index (BMI) 36.0-36.9, adult: Secondary | ICD-10-CM

## 2019-01-14 DIAGNOSIS — C680 Malignant neoplasm of urethra: Secondary | ICD-10-CM

## 2019-01-14 DIAGNOSIS — E1122 Type 2 diabetes mellitus with diabetic chronic kidney disease: Secondary | ICD-10-CM

## 2019-01-14 DIAGNOSIS — N184 Chronic kidney disease, stage 4 (severe): Secondary | ICD-10-CM | POA: Diagnosis not present

## 2019-01-14 DIAGNOSIS — G4730 Sleep apnea, unspecified: Secondary | ICD-10-CM

## 2019-01-14 MED ORDER — ATORVASTATIN CALCIUM 40 MG PO TABS: 40.0000 mg | ORAL_TABLET | Freq: Every day | ORAL | 3 refills | Status: DC

## 2019-01-14 MED ORDER — AMLODIPINE BESYLATE 5 MG PO TABS: 5.0000 mg | ORAL_TABLET | Freq: Every day | ORAL | 3 refills | Status: DC

## 2019-01-14 NOTE — Assessment & Plan Note (Signed)
S:  He received new cpap and was told needed visit between 31 and 90 days to confirm use. New machine also seems to work better than the old one which was 83 years old. He likes that it auto senses and adjusts pressure. He is not sure which pressure is being used as a result.   We reviewed his usage today. He is using the cpap everynight for 9-10 hours. He sleeps well with this. He notes compared to when he doesn't use- feels much more rested and less fatigued in the daytime.  A/P: OSA on CPAP- he is compliant and doing very well. We will send a copy of [facility] to [facility] for their records.

## 2019-01-14 NOTE — Patient Instructions (Signed)
Lets set a goal of getting that holiday weight back off by follow up- 5 lbs would be great by our April visit. 10 if we subtract clothes and shoes! You can do this.   We will update labs next visit   Hopefully everything goes through with cpap- we are going to send them a copy of today's note

## 2019-01-14 NOTE — Progress Notes (Signed)
Phone [phone]   Subjective:  Charles Castillo. is a 85 y.o. year old very pleasant male patient who presents for/with See problem oriented charting ROS- No chest pain or shortness of breath. No headache or blurry vision.    BMI monitoring- elevated BMI noted: Body mass index is 36.62 kg/m. Encouraged need for healthy eating, regular exercise, weight loss.  Technically morbidly obese with BMI over 35 with diabetes, hypertension, CKD  Exercising 2-3 days a week- walking. Holiday - didn't eat quite as well as normal.  BMI Metric Follow Up - 01/14/19 1127      BMI Metric Follow Up-Please document annually   BMI Metric Follow Up  Education provided       Past Medical History-  Patient Active Problem List   Diagnosis Date Noted  . Mild aortic stenosis 10/25/2018    Priority: High  . Type 2 diabetes mellitus with renal complication (HCC) 45/80/9983    Priority: High  . Dysphagia 10/30/2017    Priority: Medium  . Gout 05/24/2016    Priority: Medium  . Erectile dysfunction 09/15/2014    Priority: Medium  . 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 CKD (chronic kidney disease), stage IV ([place]) 03/24/2008    Priority: Medium  . Hyperlipidemia 08/27/2007    Priority: Medium  . Essential hypertension 07/03/2007    Priority: Medium  . Sleep apnea 07/03/2007    Priority: Medium  . Former smoker 09/15/2014    Priority: Low  . Morbid obesity ([place]) 10/10/2012    Priority: Low  . Osteoarthritis 07/03/2007    Priority: Low  . Syncope 05/23/2017    Medications- reviewed and updated Current Outpatient Medications  Medication Sig Dispense Refill  . amLODipine (NORVASC) 5 MG tablet Take 1 tablet (5 mg total) by mouth daily. 90 tablet 3  . atorvastatin (LIPITOR) 40 MG tablet Take 1 tablet (40 mg total) by mouth daily. 90 tablet 3  . pantoprazole (PROTONIX) 40 MG tablet Take 1 tablet by mouth twice a day 180 tablet 0   No current facility-administered medications  for this visit.      Objective:  BP 132/76 (BP Location: Left Arm, Patient Position: Sitting, Cuff Size: Normal)   Pulse 72   Temp 97.9 F (36.6 C) (Oral)   Ht 6\' 2"  (1.88 m)   Wt 285 lb 3.2 oz (129.4 kg)   SpO2 98%   BMI 36.62 kg/m  Gen: NAD, resting comfortably CV: RRR no rubs or gallops.  Faint systolic murmur noted Lungs: CTAB no crackles, wheeze, rhonchi Abdomen: soft/nontender/nondistended/normal bowel sounds. No rebound or guarding.  Obese Ext: no edema Skin: warm, dry    Assessment and Plan  Patient declines labs today- he wants to have these done at his April visit    #Hypertension/CKD IV S: Compliant with amlodipine 5 mg  He follows with Dr. Hollie Salk of nephrology.  No microalbuminuria and nephrology has not recommended ACE inhibitor or arb A/P:  Stable hypertension and CKD stage IV. Continue current medications.     #Hyperlipidemia S: Patient is compliant with atorvastatin 40 mg.  Prior was on aspirin but we took him off of this for primary prevention. A/P: Last LDL was somewhat elevated-since he is above age 26 and we are using this for primary prevention we have not felt strongly about increasing to Crestor 40 mg   #Diabetes S: Has been diet controlled on recent checks A/P: Last A1c was 6.4-patient agrees to get A1c at follow-up  #  Mild aortic stenosis S: Last echocardiogram was November 2019.  Largely asymptomatic A/P: Likely check echocardiogram November 2021 or later unless becomes symptomatic  % Urethral carcinoma-patient follows with [facility].  History of urethrectomy and bilateral lymph node dissection.  As of last note the plan was for repeat MRI in 6 months follow-up as long as scans "look good"  Sleep apnea S:  He received new cpap and was told needed visit between 31 and 90 days to confirm use. New machine also seems to work better than the old one which was 83 years old. He likes that it auto senses and adjusts pressure. He is not sure which pressure is  being used as a result.   We reviewed his usage today. He is using the cpap everynight for 9-10 hours. He sleeps well with this. He notes compared to when he doesn't use- feels much more rested and less fatigued in the daytime.  A/P: OSA on CPAP- he is compliant and doing very well. We will [facility] a [facility] of [facility].    Future Appointments  Date Time Provider [place]  04/10/2019  9:40 AM Yong Channel, Brayton Mars, MD [facility]   Meds ordered this encounter  Medications  . amLODipine (NORVASC) 5 MG tablet    Sig: Take 1 tablet (5 mg total) by mouth daily.    Dispense:  90 tablet    Refill:  3    Refill [id number]  . atorvastatin (LIPITOR) 40 MG tablet    Sig: Take 1 tablet (40 mg total) by mouth daily.    Dispense:  90 tablet    Refill:  3    Refill [id number]   Return precautions advised.  Garret Reddish, MD

## 2019-01-19 IMAGING — CR DG CHEST 2V
3 series · 3 of 3 positions shown · non-contrast
Comparison: 12/05/2006

CLINICAL DATA: Hypotensive

EXAM:
CHEST - 2 VIEW

[w chest lat (1 of 2)]
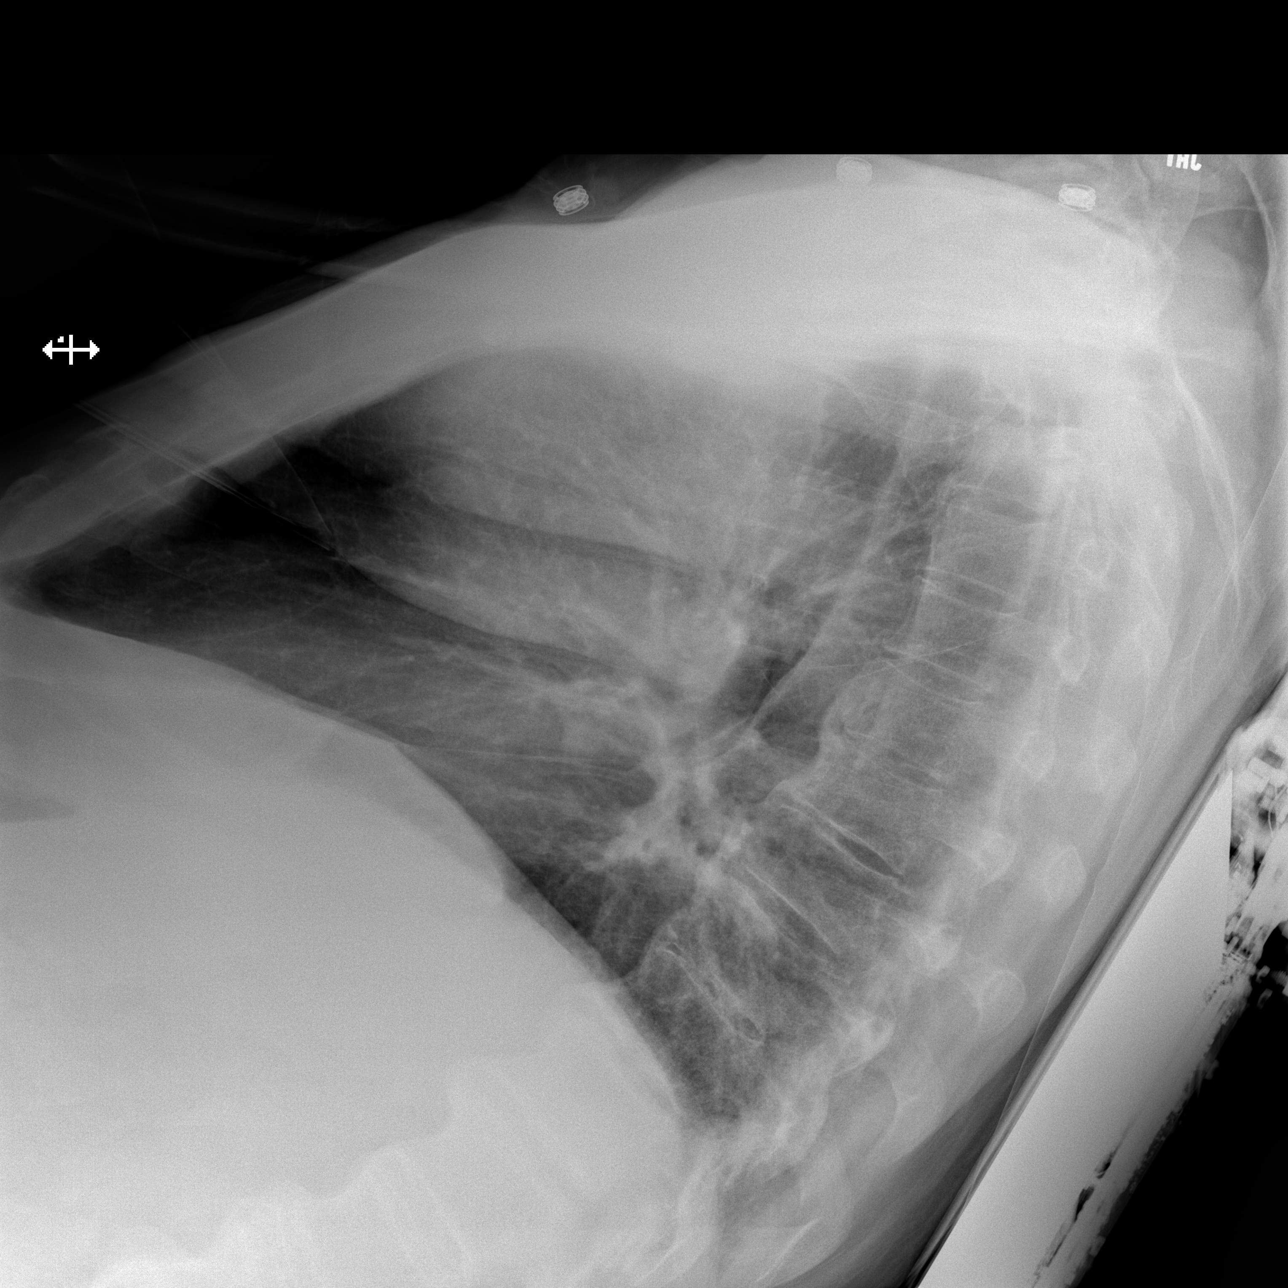

[w chest lat (2 of 2)]
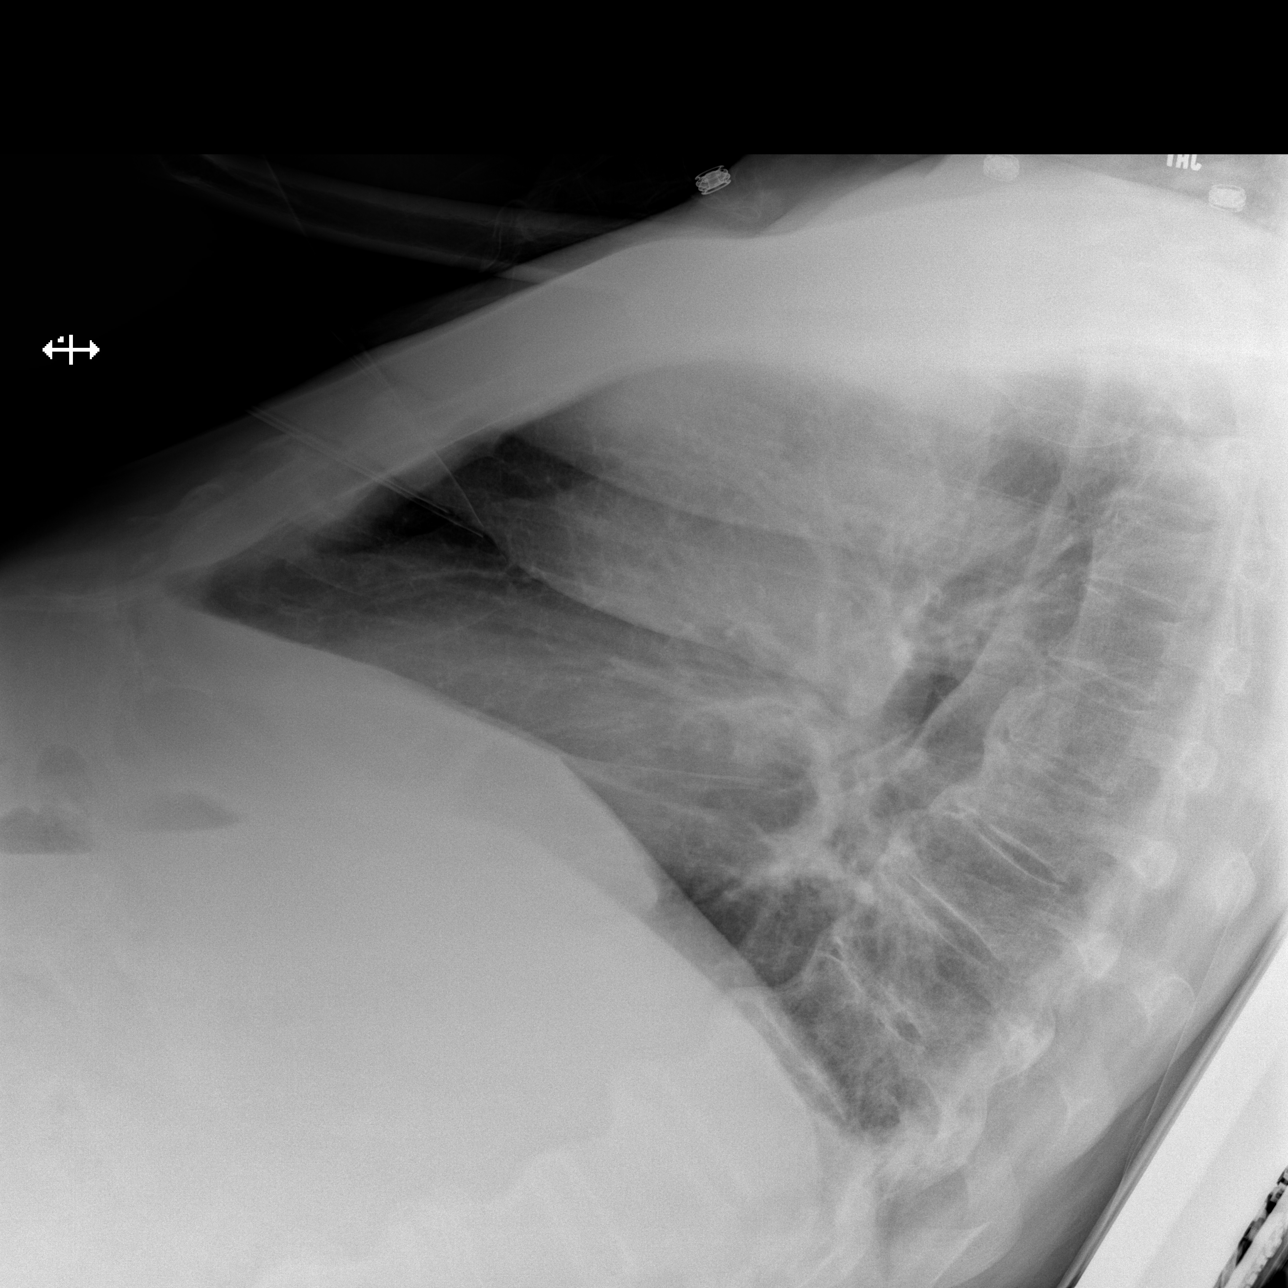

[x chest ap]
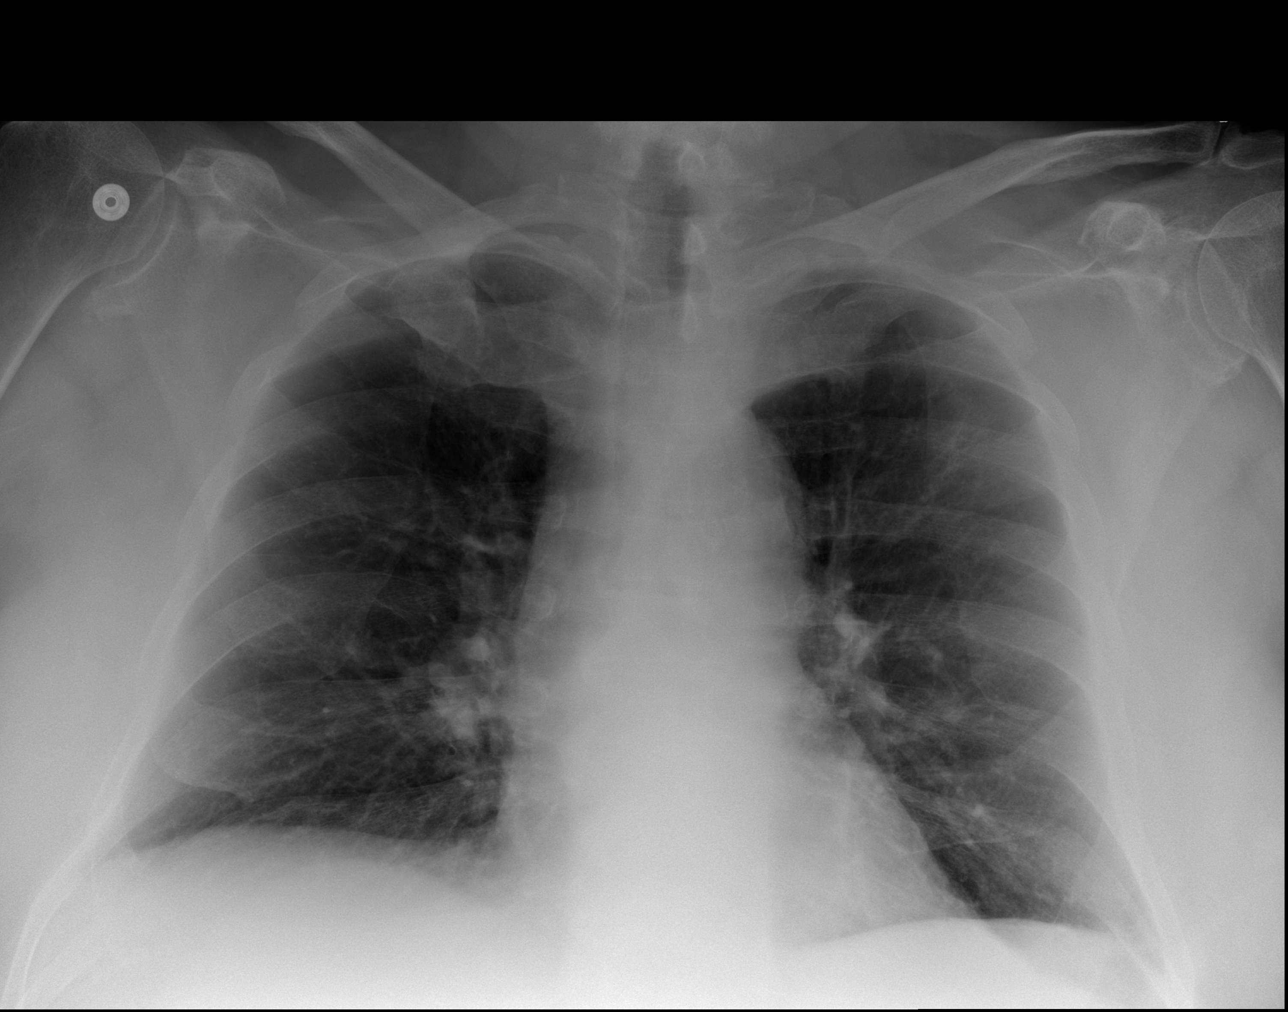

[3 of 3 positions shown; findings below may reference images not displayed]

FINDINGS: The heart size and mediastinal contours are within normal limits.
Both lungs are clear. Degenerative osteophytes of the spine.
IMPRESSION: No active cardiopulmonary disease.

## 2019-02-14 DIAGNOSIS — C609 Malignant neoplasm of penis, unspecified: Secondary | ICD-10-CM | POA: Diagnosis not present

## 2019-02-14 DIAGNOSIS — Z08 Encounter for follow-up examination after completed treatment for malignant neoplasm: Secondary | ICD-10-CM | POA: Diagnosis not present

## 2019-02-14 DIAGNOSIS — Z8559 Personal history of malignant neoplasm of other urinary tract organ: Secondary | ICD-10-CM | POA: Diagnosis not present

## 2019-02-14 DIAGNOSIS — K862 Cyst of pancreas: Secondary | ICD-10-CM | POA: Diagnosis not present

## 2019-02-14 DIAGNOSIS — C680 Malignant neoplasm of urethra: Secondary | ICD-10-CM | POA: Diagnosis not present

## 2019-02-14 DIAGNOSIS — M47814 Spondylosis without myelopathy or radiculopathy, thoracic region: Secondary | ICD-10-CM | POA: Diagnosis not present

## 2019-02-14 DIAGNOSIS — Z87891 Personal history of nicotine dependence: Secondary | ICD-10-CM | POA: Diagnosis not present

## 2019-03-06 ENCOUNTER — Telehealth: Payer: Self-pay | Admitting: Internal Medicine

## 2019-03-06 MED ORDER — PANTOPRAZOLE SODIUM 40 MG PO TBEC: 40.0000 mg | DELAYED_RELEASE_TABLET | Freq: Two times a day (BID) | ORAL | 0 refills | Status: DC

## 2019-03-06 NOTE — Telephone Encounter (Signed)
Pt needs a refill for Pantoprazole.  He changed insurance so has a Dispensing optician. Needs to be sent to [facility] specialty pharmacy.

## 2019-03-06 NOTE — Telephone Encounter (Signed)
Rx sent 

## 2019-04-10 ENCOUNTER — Ambulatory Visit (INDEPENDENT_AMBULATORY_CARE_PROVIDER_SITE_OTHER): Payer: Medicare Other | Admitting: Family Medicine

## 2019-04-10 ENCOUNTER — Encounter: Payer: Self-pay | Admitting: Family Medicine

## 2019-04-10 VITALS — BP 135/79 | HR 65 | Ht 74.0 in | Wt 282.0 lb

## 2019-04-10 DIAGNOSIS — E785 Hyperlipidemia, unspecified: Secondary | ICD-10-CM

## 2019-04-10 DIAGNOSIS — I10 Essential (primary) hypertension: Secondary | ICD-10-CM

## 2019-04-10 DIAGNOSIS — N2581 Secondary hyperparathyroidism of renal origin: Secondary | ICD-10-CM | POA: Diagnosis not present

## 2019-04-10 DIAGNOSIS — E1159 Type 2 diabetes mellitus with other circulatory complications: Secondary | ICD-10-CM | POA: Diagnosis not present

## 2019-04-10 DIAGNOSIS — E1169 Type 2 diabetes mellitus with other specified complication: Secondary | ICD-10-CM | POA: Diagnosis not present

## 2019-04-10 DIAGNOSIS — C680 Malignant neoplasm of urethra: Secondary | ICD-10-CM

## 2019-04-10 DIAGNOSIS — N184 Chronic kidney disease, stage 4 (severe): Secondary | ICD-10-CM | POA: Diagnosis not present

## 2019-04-10 DIAGNOSIS — E1122 Type 2 diabetes mellitus with diabetic chronic kidney disease: Secondary | ICD-10-CM | POA: Diagnosis not present

## 2019-04-10 DIAGNOSIS — I152 Hypertension secondary to endocrine disorders: Secondary | ICD-10-CM

## 2019-04-10 NOTE — Patient Instructions (Signed)
Health Maintenance Due  Topic Date Due  . URINE MICROALBUMIN  05/01/2019

## 2019-04-10 NOTE — Assessment & Plan Note (Signed)
BMI over 35 with hypertension, hyperlipidemia, diabetes-therefore morbid obesity. Encouraged need for healthy eating, regular exercise, weight loss.

## 2019-04-10 NOTE — Progress Notes (Signed)
Phone [phone]   Subjective:  Virtual visit via Video note. Chief complaint: Chief Complaint  Patient presents with  . Follow-up  . Hypertension    Taking Amlodipine. Not checking at home.   Marland Kitchen Hyperlipidemia    Taking Atorvastatin.   . Diabetes  . Fatigue  . Sleep Apnea  . Chronic Kidney Disease    This visit type was conducted due to national recommendations for restrictions regarding the COVID-19 Pandemic (e.g. social distancing).  This format is felt to be most appropriate for this patient at this time balancing risks to patient and risks to population by having him in for in person visit.  No physical exam was performed (except for noted visual exam or audio findings with Telehealth visits).    Our team/I connected with [name]. on 04/10/19 at  9:40 AM EDT by a video enabled telemedicine application ([email or web address]) and verified that I am speaking with the correct person using two identifiers.  Location patient: Home-[facility] Location provider: [facility], office Persons participating in the virtual visit:  patient  Our team/I discussed the limitations of evaluation and management by telemedicine and the availability of in person appointments. In light of current covid-19 pandemic, patient also understands that we are trying to protect them by minimizing in office contact if at all possible.  The patient expressed consent for telemedicine visit and agreed to proceed. Patient understands insurance will be billed.   ROS- no fever/chills/cough/diarrhea/shortness of breath.    Past Medical History-  Patient Active Problem List   Diagnosis Date Noted  . Mild aortic stenosis 10/25/2018    Priority: High  . Type II diabetes mellitus with stage 4 chronic kidney disease ([place]) 09/15/2014    Priority: High  . CKD (chronic kidney disease), stage IV ([place]) 03/24/2008    Priority: High  . Dysphagia 10/30/2017    Priority: Medium  . Gout 05/24/2016    Priority: Medium  . Erectile  dysfunction 09/15/2014    Priority: Medium  . 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 Hyperlipidemia associated with type 2 diabetes mellitus ([place]) 08/27/2007    Priority: Medium  . Hypertension associated with diabetes ([place]) 07/03/2007    Priority: Medium  . Sleep apnea 07/03/2007    Priority: Medium  . Former smoker 09/15/2014    Priority: Low  . Morbid obesity ([place]) 10/10/2012    Priority: Low  . Osteoarthritis 07/03/2007    Priority: Low  . Secondary hyperparathyroidism ([place]) 04/10/2019  . Syncope 05/23/2017    Medications- reviewed and updated Current Outpatient Medications  Medication Sig Dispense Refill  . amLODipine (NORVASC) 5 MG tablet Take 1 tablet (5 mg total) by mouth daily. 90 tablet 3  . atorvastatin (LIPITOR) 40 MG tablet Take 1 tablet (40 mg total) by mouth daily. 90 tablet 3  . pantoprazole (PROTONIX) 40 MG tablet Take 1 tablet (40 mg total) by mouth 2 (two) times daily. 180 tablet 0   No current facility-administered medications for this visit.      Objective:  BP 135/79   Pulse 65   Ht 6\' 2"  (1.88 m)   Wt 282 lb (127.9 kg)   BMI 36.21 kg/m  Gen: NAD, resting comfortably Lungs: nonlabored, normal respiratory rate  Skin: appears dry, no obvious rash     Assessment and Plan   #hypertension/CKD IV S: controlled on amlodipine in the past- still compliant  Last GFR was  23 with creatinine of 2.54- this has been pretty stable.  Last check was in decmber (5 months ago)  BP Readings from Last 3 Encounters:  04/10/19 135/79  01/14/19 132/76  11/27/18 118/72  A/P: Blood pressure well controlled/stable.  Hopeful kidney function is stable as well-patient is going to come by for drive up lab visit tomorrow.  He knows to wear a mask and our phlebotomist to wear a mask as well.  #hyperlipidemia S: mild poorly controlled on last check- he is on atorvastatin 40mg  Lab Results  Component Value Date   CHOL 180 10/30/2017   HDL 50.90  10/30/2017   LDLCALC 111 (H) 10/30/2017   LDLDIRECT 123.0 10/12/2018   TRIG 91.0 10/30/2017   CHOLHDL 4 10/30/2017   A/P: Mild poor control but given over age 89 with no history of stroke or heart attack- we opted to continue to monitor on a half max dose statin.  # Diabetes/morbid obesity with BMI of 35 and hypertension as well as hyperlipidemia and diabetes S: Well controlled on no medication Exercise and diet-patient states he is not interested at all in losing weight.  He also states he is not interested in exercising.  He states he would like to just live his remaining years as he currently is.  We discussed potential improvements in quality of life with increased exercise but he remains uninterested. Lab Results  Component Value Date   HGBA1C 6.4 10/12/2018   HGBA1C 6.1 04/30/2018   HGBA1C 6.1 10/30/2017    A/P: Thankful diabetes is controlled without medication-particularly with lack of motivation for exercise or change in diet. We will update an A1c with labs Morbid obesity is at least stable.    #GERD S: He states this is well controlled with the aid of Dr. Jules Husbands is able to get away with once a day frequently on Protonix 40 mg A/P: Stable-continue current medication.-we discussed PPIs or not ideal for CKD stage IV but on other hand patient has significant dysphagia off medication so that is not tolerable either  #Urethral carcinoma-following with Dr. Dimas Millin at [facility] MRI was reassuring.  #Secondary hyperparathyroidism-following with renal- update calcium tomorrow with labs  other notes: 1.doing delivery to the house. Getting take out at times. Staying at home.   2. shingrix discussion 3. Call back for drive up lab tomorrow  5 months but drive up test for labs tomorrow  Future Appointments  Date Time Provider [place]  04/11/2019 10:00 AM [facility]   Lab/Order associations: Type 2 diabetes mellitus with stage 4 chronic kidney disease,  without long-term current use of insulin (HCC)  CKD (chronic kidney disease), stage IV (HCC)  Hypertension associated with diabetes ([place])  Hyperlipidemia associated with type 2 diabetes mellitus ([place])  Urethral carcinoma ([place])  Morbid obesity ([place])  Secondary hyperparathyroidism ([place]), Chronic  Return precautions advised.  Garret Reddish, MD

## 2019-04-11 ENCOUNTER — Other Ambulatory Visit (INDEPENDENT_AMBULATORY_CARE_PROVIDER_SITE_OTHER): Payer: Medicare Other

## 2019-04-11 DIAGNOSIS — E1159 Type 2 diabetes mellitus with other circulatory complications: Secondary | ICD-10-CM

## 2019-04-11 DIAGNOSIS — I10 Essential (primary) hypertension: Secondary | ICD-10-CM | POA: Diagnosis not present

## 2019-04-11 DIAGNOSIS — E1122 Type 2 diabetes mellitus with diabetic chronic kidney disease: Secondary | ICD-10-CM

## 2019-04-11 DIAGNOSIS — E1169 Type 2 diabetes mellitus with other specified complication: Secondary | ICD-10-CM | POA: Diagnosis not present

## 2019-04-11 DIAGNOSIS — E785 Hyperlipidemia, unspecified: Secondary | ICD-10-CM

## 2019-04-11 DIAGNOSIS — I152 Hypertension secondary to endocrine disorders: Secondary | ICD-10-CM

## 2019-04-11 DIAGNOSIS — N184 Chronic kidney disease, stage 4 (severe): Secondary | ICD-10-CM

## 2019-04-11 LAB — COMPREHENSIVE METABOLIC PANEL
ALT: 18 U/L (ref 0–53)
AST: 14 U/L (ref 0–37)
Albumin: 3.8 g/dL (ref 3.5–5.2)
Alkaline Phosphatase: 93 U/L (ref 39–117)
BUN: 36 mg/dL — ABNORMAL HIGH (ref 6–23)
CO2: 26 mEq/L (ref 19–32)
Calcium: 9 mg/dL (ref 8.4–10.5)
Chloride: 107 mEq/L (ref 96–112)
Creatinine, Ser: 2.49 mg/dL — ABNORMAL HIGH (ref 0.40–1.50)
GFR: 24.93 mL/min — ABNORMAL LOW (ref >60.00)
Glucose, Bld: 132 mg/dL — ABNORMAL HIGH (ref 70–99)
Potassium: 4.7 mEq/L (ref 3.5–5.1)
Sodium: 140 mEq/L (ref 135–145)
Total Bilirubin: 0.7 mg/dL (ref 0.2–1.2)
Total Protein: 6.5 g/dL (ref 6.0–8.3)

## 2019-04-11 LAB — CBC
HCT: 43.7 % (ref 39.0–52.0)
Hemoglobin: 14.8 g/dL (ref 13.0–17.0)
MCHC: 33.9 g/dL (ref 30.0–36.0)
MCV: 90.5 fl (ref 78.0–100.0)
Platelets: 181 10*3/uL (ref 150.0–400.0)
RBC: 4.83 Mil/uL (ref 4.22–5.81)
RDW: 14.2 % (ref 11.5–15.5)
WBC: 6.3 10*3/uL (ref 4.0–10.5)

## 2019-04-11 LAB — LIPID PANEL
Cholesterol: 167 mg/dL (ref 0–200)
HDL: 42.7 mg/dL (ref >39.00)
LDL Cholesterol: 103 mg/dL — ABNORMAL HIGH (ref 0–99)
NonHDL: 124.02
Total CHOL/HDL Ratio: 4
Triglycerides: 107 mg/dL (ref 0.0–149.0)
VLDL: 21.4 mg/dL (ref 0.0–40.0)

## 2019-04-11 LAB — HEMOGLOBIN A1C: Hgb A1c MFr Bld: 6.5 % (ref 4.6–6.5)

## 2019-06-14 DIAGNOSIS — R351 Nocturia: Secondary | ICD-10-CM | POA: Diagnosis not present

## 2019-06-14 DIAGNOSIS — N401 Enlarged prostate with lower urinary tract symptoms: Secondary | ICD-10-CM | POA: Diagnosis not present

## 2019-06-14 DIAGNOSIS — R8271 Bacteriuria: Secondary | ICD-10-CM | POA: Diagnosis not present

## 2019-06-18 ENCOUNTER — Encounter: Payer: Self-pay | Admitting: Family Medicine

## 2019-06-18 ENCOUNTER — Ambulatory Visit (INDEPENDENT_AMBULATORY_CARE_PROVIDER_SITE_OTHER): Payer: Medicare Other | Admitting: Family Medicine

## 2019-06-18 DIAGNOSIS — R531 Weakness: Secondary | ICD-10-CM

## 2019-06-18 NOTE — Progress Notes (Signed)
    Chief Complaint:  Charles Castillo. is a 85 y.o. male who presents today for a virtual office visit with a chief complaint of weakness.   Assessment/Plan:  Weakness Unclear etiology. Difficult to make full assessment via telehealth.  He has full range of motion in his lower extremities however has difficulty getting out of his chair.  He will come into the office tomorrow morning for further evaluation including physical exam, and labs/urine/x-rays as needed.  Discussed reasons to seek emergent care.     Subjective:  HPI:  Weakness Symptoms started 5 days ago.  He was seen with urology who told him he may have a "low-grade urinary tract infection" and told him there was nothing to worry about.  Symptoms worsen over the last couple of days.  He now has significant difficulty getting out of his chair.  He has baseline urinary frequency.  No blood in his urine.  No dysuria.  No respiratory symptoms.  No cough, sneeze, chills, or fevers.  No abdominal pain.  No back pain.  No numbness or headaches.  No obvious precipitating events.  No obvious aggravating or alleviating factors.  ROS: Per HPI  PMH: He reports that he quit smoking about 50 years ago. His smoking use included cigarettes. He has a 25.00 pack-year smoking history. He has never used smokeless tobacco. He reports that he does not drink alcohol or use drugs.      Objective/Observations  Physical Exam: Gen: NAD, resting comfortably Pulm: Normal work of breathing Neuro: Grossly normal, moves all extremities Psych: Normal affect and thought content  Virtual Visit via Video   I connected with Charles Castillo. on 06/18/19 at  2:20 PM EDT by a video enabled telemedicine application and verified that I am speaking with the correct person using two identifiers. I discussed the limitations of evaluation and management by telemedicine and the availability of in person appointments. The patient expressed understanding and agreed to  proceed.   Patient location: Home Provider location: [place] participating in the virtual visit: Myself and Patient     [name]. [name], MD 06/18/2019 2:36 PM

## 2019-06-19 ENCOUNTER — Other Ambulatory Visit: Payer: Self-pay

## 2019-06-19 ENCOUNTER — Encounter (HOSPITAL_COMMUNITY): Payer: Self-pay

## 2019-06-19 ENCOUNTER — Observation Stay (HOSPITAL_COMMUNITY): Payer: Self-pay

## 2019-06-19 ENCOUNTER — Encounter: Payer: Self-pay | Admitting: Family Medicine

## 2019-06-19 ENCOUNTER — Observation Stay (HOSPITAL_COMMUNITY): Payer: Medicare Other

## 2019-06-19 ENCOUNTER — Emergency Department (HOSPITAL_COMMUNITY): Payer: Medicare Other

## 2019-06-19 ENCOUNTER — Inpatient Hospital Stay (HOSPITAL_COMMUNITY)
Admission: EM | Admit: 2019-06-19 | Discharge: 2019-06-24 | DRG: 178 | Disposition: A | Discharge status: Home / Self Care | Payer: Medicare Other | Attending: Internal Medicine | Admitting: Internal Medicine

## 2019-06-19 ENCOUNTER — Other Ambulatory Visit (INDEPENDENT_AMBULATORY_CARE_PROVIDER_SITE_OTHER): Payer: Medicare Other

## 2019-06-19 ENCOUNTER — Ambulatory Visit: Payer: Medicare Other | Admitting: Family Medicine

## 2019-06-19 VITALS — BP 96/67 | HR 91 | Temp 100.5°F

## 2019-06-19 DIAGNOSIS — R740 Nonspecific elevation of levels of transaminase and lactic acid dehydrogenase [LDH]: Secondary | ICD-10-CM | POA: Diagnosis not present

## 2019-06-19 DIAGNOSIS — E1165 Type 2 diabetes mellitus with hyperglycemia: Secondary | ICD-10-CM | POA: Diagnosis present

## 2019-06-19 DIAGNOSIS — Z79899 Other long term (current) drug therapy: Secondary | ICD-10-CM | POA: Diagnosis not present

## 2019-06-19 DIAGNOSIS — N390 Urinary tract infection, site not specified: Secondary | ICD-10-CM | POA: Diagnosis not present

## 2019-06-19 DIAGNOSIS — R531 Weakness: Secondary | ICD-10-CM

## 2019-06-19 DIAGNOSIS — E860 Dehydration: Secondary | ICD-10-CM | POA: Diagnosis present

## 2019-06-19 DIAGNOSIS — I959 Hypotension, unspecified: Secondary | ICD-10-CM

## 2019-06-19 DIAGNOSIS — M1990 Unspecified osteoarthritis, unspecified site: Secondary | ICD-10-CM | POA: Diagnosis not present

## 2019-06-19 DIAGNOSIS — Z713 Dietary counseling and surveillance: Secondary | ICD-10-CM

## 2019-06-19 DIAGNOSIS — G4733 Obstructive sleep apnea (adult) (pediatric): Secondary | ICD-10-CM | POA: Diagnosis not present

## 2019-06-19 DIAGNOSIS — S0990XA Unspecified injury of head, initial encounter: Secondary | ICD-10-CM | POA: Diagnosis not present

## 2019-06-19 DIAGNOSIS — I951 Orthostatic hypotension: Secondary | ICD-10-CM | POA: Diagnosis not present

## 2019-06-19 DIAGNOSIS — Y92239 Unspecified place in hospital as the place of occurrence of the external cause: Secondary | ICD-10-CM | POA: Diagnosis not present

## 2019-06-19 DIAGNOSIS — N179 Acute kidney failure, unspecified: Secondary | ICD-10-CM | POA: Diagnosis not present

## 2019-06-19 DIAGNOSIS — U071 COVID-19: Principal | ICD-10-CM | POA: Diagnosis present

## 2019-06-19 DIAGNOSIS — M6281 Muscle weakness (generalized): Secondary | ICD-10-CM | POA: Diagnosis not present

## 2019-06-19 DIAGNOSIS — I350 Nonrheumatic aortic (valve) stenosis: Secondary | ICD-10-CM | POA: Diagnosis not present

## 2019-06-19 DIAGNOSIS — Z8249 Family history of ischemic heart disease and other diseases of the circulatory system: Secondary | ICD-10-CM | POA: Diagnosis not present

## 2019-06-19 DIAGNOSIS — W07XXXA Fall from chair, initial encounter: Secondary | ICD-10-CM | POA: Diagnosis not present

## 2019-06-19 DIAGNOSIS — E1122 Type 2 diabetes mellitus with diabetic chronic kidney disease: Secondary | ICD-10-CM

## 2019-06-19 DIAGNOSIS — Z85828 Personal history of other malignant neoplasm of skin: Secondary | ICD-10-CM

## 2019-06-19 DIAGNOSIS — N184 Chronic kidney disease, stage 4 (severe): Secondary | ICD-10-CM | POA: Diagnosis present

## 2019-06-19 DIAGNOSIS — R509 Fever, unspecified: Secondary | ICD-10-CM | POA: Diagnosis not present

## 2019-06-19 DIAGNOSIS — N401 Enlarged prostate with lower urinary tract symptoms: Secondary | ICD-10-CM | POA: Diagnosis present

## 2019-06-19 DIAGNOSIS — Z8559 Personal history of malignant neoplasm of other urinary tract organ: Secondary | ICD-10-CM | POA: Diagnosis not present

## 2019-06-19 DIAGNOSIS — Z209 Contact with and (suspected) exposure to unspecified communicable disease: Secondary | ICD-10-CM | POA: Diagnosis not present

## 2019-06-19 DIAGNOSIS — R270 Ataxia, unspecified: Secondary | ICD-10-CM | POA: Diagnosis not present

## 2019-06-19 DIAGNOSIS — I129 Hypertensive chronic kidney disease with stage 1 through stage 4 chronic kidney disease, or unspecified chronic kidney disease: Secondary | ICD-10-CM | POA: Diagnosis not present

## 2019-06-19 DIAGNOSIS — E785 Hyperlipidemia, unspecified: Secondary | ICD-10-CM | POA: Diagnosis present

## 2019-06-19 DIAGNOSIS — T380X5A Adverse effect of glucocorticoids and synthetic analogues, initial encounter: Secondary | ICD-10-CM | POA: Diagnosis not present

## 2019-06-19 DIAGNOSIS — Z6836 Body mass index (BMI) 36.0-36.9, adult: Secondary | ICD-10-CM

## 2019-06-19 DIAGNOSIS — R0602 Shortness of breath: Secondary | ICD-10-CM

## 2019-06-19 DIAGNOSIS — W19XXXA Unspecified fall, initial encounter: Secondary | ICD-10-CM

## 2019-06-19 DIAGNOSIS — Z87891 Personal history of nicotine dependence: Secondary | ICD-10-CM | POA: Diagnosis not present

## 2019-06-19 DIAGNOSIS — K219 Gastro-esophageal reflux disease without esophagitis: Secondary | ICD-10-CM | POA: Diagnosis present

## 2019-06-19 DIAGNOSIS — R55 Syncope and collapse: Secondary | ICD-10-CM | POA: Diagnosis not present

## 2019-06-19 LAB — COMPREHENSIVE METABOLIC PANEL
ALT: 40 U/L (ref 0–44)
AST: 47 U/L — ABNORMAL HIGH (ref 15–41)
Albumin: 3.4 g/dL — ABNORMAL LOW (ref 3.5–5.0)
Alkaline Phosphatase: 72 U/L (ref 38–126)
Anion gap: 10 (ref 5–15)
BUN: 45 mg/dL — ABNORMAL HIGH (ref 8–23)
CO2: 19 mmol/L — ABNORMAL LOW (ref 22–32)
Calcium: 8.9 mg/dL (ref 8.9–10.3)
Chloride: 108 mmol/L (ref 98–111)
Creatinine, Ser: 3 mg/dL — ABNORMAL HIGH (ref 0.61–1.24)
GFR calc Af Amer: 21 mL/min — ABNORMAL LOW (ref >60)
GFR calc non Af Amer: 18 mL/min — ABNORMAL LOW (ref >60)
Glucose, Bld: 153 mg/dL — ABNORMAL HIGH (ref 70–99)
Potassium: 4.2 mmol/L (ref 3.5–5.1)
Sodium: 137 mmol/L (ref 135–145)
Total Bilirubin: 0.9 mg/dL (ref 0.3–1.2)
Total Protein: 7.9 g/dL (ref 6.5–8.1)

## 2019-06-19 LAB — URINALYSIS, ROUTINE W REFLEX MICROSCOPIC
Bilirubin Urine: NEGATIVE
Glucose, UA: NEGATIVE mg/dL
Ketones, ur: NEGATIVE mg/dL
Leukocytes,Ua: NEGATIVE
Nitrite: NEGATIVE
Protein, ur: >=300 mg/dL — AB
Specific Gravity, Urine: 1.018 (ref 1.005–1.030)
pH: 5 (ref 5.0–8.0)

## 2019-06-19 LAB — CBC WITH DIFFERENTIAL/PLATELET
Abs Immature Granulocytes: 0.01 10*3/uL (ref 0.00–0.07)
Basophils Absolute: 0 10*3/uL (ref 0.0–0.1)
Basophils Relative: 0 %
Eosinophils Absolute: 0 10*3/uL (ref 0.0–0.5)
Eosinophils Relative: 0 %
HCT: 48.2 % (ref 39.0–52.0)
Hemoglobin: 15.3 g/dL (ref 13.0–17.0)
Immature Granulocytes: 0 %
Lymphocytes Relative: 12 %
Lymphs Abs: 0.6 10*3/uL — ABNORMAL LOW (ref 0.7–4.0)
MCH: 29.4 pg (ref 26.0–34.0)
MCHC: 31.7 g/dL (ref 30.0–36.0)
MCV: 92.5 fL (ref 80.0–100.0)
Monocytes Absolute: 0.6 10*3/uL (ref 0.1–1.0)
Monocytes Relative: 11 %
Neutro Abs: 3.8 10*3/uL (ref 1.7–7.7)
Neutrophils Relative %: 77 %
Platelets: 162 10*3/uL (ref 150–400)
RBC: 5.21 MIL/uL (ref 4.22–5.81)
RDW: 14.2 % (ref 11.5–15.5)
WBC: 5 10*3/uL (ref 4.0–10.5)
nRBC: 0 % (ref 0.0–0.2)

## 2019-06-19 LAB — PROCALCITONIN: Procalcitonin: 0.43 ng/mL

## 2019-06-19 LAB — LACTATE DEHYDROGENASE: LDH: 242 U/L — ABNORMAL HIGH (ref 98–192)

## 2019-06-19 LAB — TRIGLYCERIDES: Triglycerides: 150 mg/dL — ABNORMAL HIGH (ref <150)

## 2019-06-19 LAB — FERRITIN: Ferritin: 1286 ng/mL — ABNORMAL HIGH (ref 24–336)

## 2019-06-19 LAB — FIBRINOGEN: Fibrinogen: 667 mg/dL — ABNORMAL HIGH (ref 210–475)

## 2019-06-19 LAB — CBG MONITORING, ED: Glucose-Capillary: 145 mg/dL — ABNORMAL HIGH (ref 70–99)

## 2019-06-19 LAB — TROPONIN I (HIGH SENSITIVITY): Troponin I (High Sensitivity): 13 ng/L (ref <18)

## 2019-06-19 LAB — LACTIC ACID, PLASMA: Lactic Acid, Venous: 1.3 mmol/L (ref 0.5–1.9)

## 2019-06-19 LAB — C-REACTIVE PROTEIN: CRP: 6 mg/dL — ABNORMAL HIGH (ref <1.0)

## 2019-06-19 LAB — ABO/RH: ABO/RH(D): A POS

## 2019-06-19 LAB — SARS CORONAVIRUS 2 BY RT PCR (HOSPITAL ORDER, PERFORMED IN ~~LOC~~ HOSPITAL LAB): SARS Coronavirus 2: POSITIVE — AB

## 2019-06-19 LAB — D-DIMER, QUANTITATIVE: D-Dimer, Quant: 1.49 ug/mL-FEU — ABNORMAL HIGH (ref 0.00–0.50)

## 2019-06-19 MED ORDER — PANTOPRAZOLE SODIUM 40 MG PO TBEC
40.0000 mg | DELAYED_RELEASE_TABLET | Freq: Two times a day (BID) | ORAL | Status: DC
  Administered 2019-06-19 – 2019-06-24 (×10): 40 mg via ORAL
  Filled 2019-06-19 (×10): qty 1

## 2019-06-19 MED ORDER — ALUM & MAG HYDROXIDE-SIMETH 200-200-20 MG/5ML PO SUSP: 30.0000 mL | Freq: Four times a day (QID) | ORAL | Status: DC | PRN

## 2019-06-19 MED ORDER — SODIUM CHLORIDE 0.9 % IV SOLN
INTRAVENOUS | Status: AC
  Administered 2019-06-19: 22:00:00 via INTRAVENOUS

## 2019-06-19 MED ORDER — SODIUM CHLORIDE 0.9% FLUSH
3.0000 mL | Freq: Two times a day (BID) | INTRAVENOUS | Status: DC
  Administered 2019-06-19 – 2019-06-24 (×8): 3 mL via INTRAVENOUS

## 2019-06-19 MED ORDER — GUAIFENESIN-DM 100-10 MG/5ML PO SYRP: 10.0000 mL | ORAL_SOLUTION | ORAL | Status: DC | PRN

## 2019-06-19 MED ORDER — OXYCODONE HCL 5 MG PO TABS
5.0000 mg | ORAL_TABLET | ORAL | Status: DC | PRN
  Administered 2019-06-20: 21:00:00 5 mg via ORAL
  Filled 2019-06-19: qty 1

## 2019-06-19 MED ORDER — HYDROCOD POLST-CPM POLST ER 10-8 MG/5ML PO SUER: 5.0000 mL | Freq: Two times a day (BID) | ORAL | Status: DC | PRN

## 2019-06-19 MED ORDER — ACETAMINOPHEN 500 MG PO TABS
1000.0000 mg | ORAL_TABLET | Freq: Four times a day (QID) | ORAL | Status: DC | PRN
  Administered 2019-06-20 – 2019-06-21 (×2): 1000 mg via ORAL
  Filled 2019-06-19 (×2): qty 2

## 2019-06-19 MED ORDER — ONDANSETRON HCL 4 MG/2ML IJ SOLN: 4.0000 mg | Freq: Four times a day (QID) | INTRAMUSCULAR | Status: DC | PRN

## 2019-06-19 MED ORDER — ENOXAPARIN SODIUM 40 MG/0.4ML ~~LOC~~ SOLN
40.0000 mg | SUBCUTANEOUS | Status: DC
  Administered 2019-06-19 – 2019-06-23 (×5): 40 mg via SUBCUTANEOUS
  Filled 2019-06-19 (×5): qty 0.4

## 2019-06-19 MED ORDER — ONDANSETRON HCL 4 MG PO TABS: 4.0000 mg | ORAL_TABLET | Freq: Four times a day (QID) | ORAL | Status: DC | PRN

## 2019-06-19 MED ORDER — ATORVASTATIN CALCIUM 40 MG PO TABS
40.0000 mg | ORAL_TABLET | Freq: Every day | ORAL | Status: DC
  Administered 2019-06-19 – 2019-06-23 (×5): 40 mg via ORAL
  Filled 2019-06-19 (×5): qty 1

## 2019-06-19 MED ORDER — SODIUM CHLORIDE 0.9 % IV BOLUS
1000.0000 mL | Freq: Once | INTRAVENOUS | Status: AC
  Administered 2019-06-19: 12:00:00 1000 mL via INTRAVENOUS

## 2019-06-19 NOTE — Progress Notes (Signed)
   Chief Complaint:  Charles Castillo. is a 85 y.o. male who presents today with a chief complaint of weakness.   Assessment/Plan:  Weakness/Hypotension/Fever Patient's constellation of symptoms raise concern for sepsis of unknown etiology.   He is at high risk for serious illness that poses a significant risk to life or bodily function.  Given his fever and low blood pressure readings, recommended that patient go to the ED via EMS for further evaluation and management. He agreed with this plan.   Of note, wife is currently being tested for COVID and has not yet gotten her test results back.  Patient does not have any respiratory symptoms however does have documented fever here.    Subjective:  HPI:  Weakness Patient had virtual visit yesterday with complaints of weakness and difficulty getting out of his chair.  He is here today for an office visit.  Symptoms have worsened since yesterday.  Feels very fatigued and malaise.  Does not currently have any other symptoms.  No reported dysuria.  Has urinary frequency that is at baseline.  No reported cough or shortness of breath. No treatments tried. Weakness is worth with minimal exertion. No other obvious alleviating or aggravating factors.   ROS: Per HPI, otherwise complete ROS negative.   IDC:VUDTHYH of urethral carcinoma and stage 4 CKD.  Social history: He reports that he quit smoking about 50 years ago. His smoking use included cigarettes. He has a 25.00 pack-year smoking history. He has never used smokeless tobacco. He reports that he does not drink alcohol or use drugs.      Objective:  Physical Exam: BP 96/67 (BP Location: Left Arm, Patient Position: Sitting, Cuff Size: Large)   Pulse 91   Temp (!) 100.5 F (38.1 C) (Oral)   SpO2 96%   Gen: NAD, ill appearing in NAD. Resting in wheelchair.      [name]. [name], MD 06/19/2019 9:51 AM

## 2019-06-19 NOTE — H&P (Signed)
History and Physical    Charles Castillo. GYI:[id number] DOB: 1934-11-06 DOA: 06/19/2019  PCP: Marin Olp, MD   Patient coming from: Home via Urgent Care  Chief Complaint: Weakness, SOB on Exertion  HPI: Charles Monforte. is a 85 y.o. 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  Patient also had developed some low-grade temperatures as well and is feeling "worn out".  Today he went to go see urgent care and almost had a fall and fell to the floor attempting to get out of his wheelchair to sit on the examining table. This was unwitnessed fall in the the urgent care and patient states that he did not lose consciousness.  EMS was called and placed the patient on a stretcher brought him to the ED for further evaluation.  Patient states that he has been severely weak and has had a difficult time ambulating.  States he did not actually lose consciousness today.  Denies chest pain, lightheadedness or dizziness but states that the weakness is taking a toll on him.  Wife is currently under investigation for COVID symptoms as well.  TRH was asked to admit for the above symptoms and patient will be transferred to [facility] as his COVID positive.  ED Course: ED basic blood work was done and patient had a COVID-19 test done which was positive.  Patient was given 1 L of normal saline bolus.  Chest x-ray done  Review of Systems: As per HPI otherwise 10 point review of systems negative.   Past Medical History:  Diagnosis Date  . Arthritis   . BPH (benign prostatic hypertrophy) with urinary obstruction    . Cancer ([place])    urethral   . CKD (chronic kidney disease), stage III ([place])   . Diet-controlled type 2 diabetes mellitus ([place])   . History of cellulitis    2012-- left lower extremitiy  . History of urinary tract part removal    2008--  partial urethrectomy for SCC  . Hyperlipidemia   . Hypertension   . Ingrown toenail 10/14/2018   Left big toe s/p removal  . Organic impotence   . OSA on CPAP    mild to moderate per study 04-19-2011  . Penile lesion   . Sleep apnea    CPAP use   Past Surgical History:  Procedure Laterality Date  . CYSTO/  BILATERAL RETROGRADE PYELOGRAM/  RESECTION PROSTATIC URETHRAL TUMOR/  EXCISION BIOPSY URETHRAL MEATUS AND MEATOTOMY/  TRANSRECTAL ULTRASOUND PROSTATE BIOPSY'S  12-05-2006  . CYSTOSCOPY N/A 06/18/2015   Procedure: CYSTOSCOPY FLEXIBLE;  Surgeon: Irine Seal, MD;  Location: [facility];  Service: Urology;  Laterality: N/A;  . PENILE BIOPSY N/A 06/18/2015   Procedure: PENILE BIOPSY;  Surgeon: Irine Seal, MD;  Location: [facility];  Service: Urology;  Laterality: N/A;  . URETHRECTOMY  05-01-2007   Partial  (squamous cell carcinoma )   SOCIAL HISTORY   reports that he quit smoking about 50 years ago. His smoking use included cigarettes. He has a 25.00 pack-year smoking history. He has never used smokeless tobacco. He reports that he does not drink alcohol or use  drugs.  ALLERGIES No Known Allergies  Family History  Problem Relation Age of Onset  . Heart failure Father        CHF, no history MI  . Colon cancer Neg Hx   . Stomach cancer Neg Hx   . Esophageal cancer Neg Hx    Prior to Admission medications   Medication Sig Start Date End Date Taking? Authorizing Provider  acetaminophen (TYLENOL) 500 MG tablet Take 1,000 mg by mouth every 6 (six) hours as needed for mild pain or headache.   Yes [provider]  amLODipine (NORVASC) 5 MG tablet Take 1 tablet (5 mg total) by mouth daily. Patient taking  differently: Take 5 mg by mouth at bedtime.  01/14/19  Yes Marin Olp, MD  atorvastatin (LIPITOR) 40 MG tablet Take 1 tablet (40 mg total) by mouth daily. Patient taking differently: Take 40 mg by mouth at bedtime.  01/14/19  Yes Marin Olp, MD  OVER THE COUNTER MEDICATION Take 1 tablet by mouth at bedtime as needed (sleep). [employer] brand sleep aid   Yes [provider]  pantoprazole (PROTONIX) 40 MG tablet Take 1 tablet (40 mg total) by mouth 2 (two) times daily. Patient taking differently: Take 40 mg by mouth at bedtime.  03/06/19  Yes PyrtleLajuan Lines, MD   Physical Exam: Vitals:   06/19/19 1600 06/19/19 1630 06/19/19 1700 06/19/19 1730  BP: (!) 160/92 139/90 (!) 155/101 (!) 168/97  Pulse: 81 82 87 82  Resp: (!) 24 (!) 26 (!) 22 (!) 22  Temp:      TempSrc:      SpO2: 99% 99% 98% 95%  Weight:      Height:       Constitutional: WN/WD obese elderly Caucasian male in NAD and appears calm but uncomfortable Eyes: Lids and conjunctivae normal, sclerae anicteric  ENMT: External Ears, Nose appear normal. Grossly normal hearing.  Neck: Appears normal, supple, no cervical masses, normal ROM, no appreciable thyromegaly; no JVD Respiratory: Diminished to auscultation bilaterally, no wheezing, rales, rhonchi or crackles. Mildly tachypenic. No accessory muscle use.  Cardiovascular: RRR, no murmurs / rubs / gallops. S1 and S2 auscultated. Trace LE extremity edema.  Abdomen: Soft, non-tender, Distended due to body habitus. No masses palpated. No appreciable hepatosplenomegaly. Bowel sounds positive x4.  GU: Deferred. Musculoskeletal: No clubbing / cyanosis of digits/nails. No joint deformity upper and lower extremities.  Skin: No rashes, lesions, ulcers on a limited skin evaluation. No induration; Warm and dry.  Neurologic: CN 2-12 grossly intact with no focal deficits. Romberg sign and cerebellar reflexes not assessed.  Psychiatric: Normal judgment and insight. Alert and oriented x  3. Normal mood and appropriate affect.   Labs on Admission: I have personally reviewed following labs and imaging studies  CBC: Recent Labs  Lab 06/19/19 1126  WBC 5.0  NEUTROABS 3.8  HGB 15.3  HCT 48.2  MCV 92.5  PLT 030   Basic Metabolic Panel: Recent Labs  Lab 06/19/19 1126  NA 137  K 4.2  CL 108  CO2 19*  GLUCOSE 153*  BUN 45*  CREATININE 3.00*  CALCIUM 8.9   GFR: Estimated Creatinine Clearance: 27 mL/min (A) (by C-G formula based on SCr of 3 mg/dL (H)). Liver Function Tests: Recent Labs  Lab 06/19/19 1126  AST 47*  ALT 40  ALKPHOS 72  BILITOT 0.9  PROT 7.9  ALBUMIN 3.4*   No results for input(s): LIPASE, AMYLASE in the last 168 hours. No results for input(s):  AMMONIA in the last 168 hours. Coagulation Profile: No results for input(s): INR, PROTIME in the last 168 hours. Cardiac Enzymes: No results for input(s): CKTOTAL, CKMB, CKMBINDEX, TROPONINI in the last 168 hours. BNP (last 3 results) No results for input(s): PROBNP in the last 8760 hours. HbA1C: No results for input(s): HGBA1C in the last 72 hours. CBG: Recent Labs  Lab 06/19/19 1136  GLUCAP 145*   Lipid Profile: Recent Labs    06/19/19 1200  TRIG 150*   Thyroid Function Tests: No results for input(s): TSH, T4TOTAL, FREET4, T3FREE, THYROIDAB in the last 72 hours. Anemia Panel: Recent Labs    06/19/19 1200  FERRITIN 1,286*   Urine analysis:    Component Value Date/Time   COLORURINE YELLOW 06/19/2019 1500   APPEARANCEUR HAZY (A) 06/19/2019 1500   LABSPEC 1.018 06/19/2019 1500   PHURINE 5.0 06/19/2019 1500   GLUCOSEU NEGATIVE 06/19/2019 1500   GLUCOSEU NEGATIVE 10/22/2018 1315   HGBUR MODERATE (A) 06/19/2019 1500   HGBUR trace-lysed 11/04/2009 1053   BILIRUBINUR NEGATIVE 06/19/2019 1500   BILIRUBINUR neg 10/20/2018 0957   KETONESUR NEGATIVE 06/19/2019 1500   PROTEINUR >=300 (A) 06/19/2019 1500   UROBILINOGEN 0.2 10/22/2018 1315   NITRITE NEGATIVE 06/19/2019 1500    LEUKOCYTESUR NEGATIVE 06/19/2019 1500   Sepsis Labs: !!!!!!!!!!!!!!!!!!!!!!!!!!!!!!!!!!!!!!!!!!!! @LABRCNTIP (procalcitonin:4,lacticidven:4) ) Recent Results (from the past 240 hour(s))  SARS Coronavirus 2 (CEPHEID- Performed in [place] hospital lab), Hosp Order     Status: Abnormal   Collection Time: 06/19/19  1:05 PM   Specimen: Nasopharyngeal Swab  Result Value Ref Range Status   SARS Coronavirus 2 POSITIVE (A) NEGATIVE Final    Comment: RESULT CALLED TO, READ BACK BY AND VERIFIED WITH: AMANDA RN AT 1519 ON 06/19/2019 BY S.VANHOORNE (NOTE) If result is NEGATIVE SARS-CoV-2 target nucleic acids are NOT DETECTED. The SARS-CoV-2 RNA is generally detectable in upper and lower  respiratory specimens during the acute phase of infection. The lowest  concentration of SARS-CoV-2 viral copies this assay can detect is 250  copies / mL. A negative result does not preclude SARS-CoV-2 infection  and should not be used as the sole basis for treatment or other  patient management decisions.  A negative result may occur with  improper specimen collection / handling, submission of specimen other  than nasopharyngeal swab, presence of viral mutation(s) within the  areas targeted by this assay, and inadequate number of viral copies  (<250 copies / mL). A negative result must be combined with clinical  observations, patient history, and epidemiological information. If result is POSITIVE SARS-CoV-2 target nucleic acids are DET ECTED. The SARS-CoV-2 RNA is generally detectable in upper and lower  respiratory specimens during the acute phase of infection.  Positive  results are indicative of active infection with SARS-CoV-2.  Clinical  correlation with patient history and other diagnostic information is  necessary to determine patient infection status.  Positive results do  not rule out bacterial infection or co-infection with other viruses. If result is PRESUMPTIVE POSTIVE SARS-CoV-2 nucleic acids  MAY BE PRESENT.   A presumptive positive result was obtained on the submitted specimen  and confirmed on repeat testing.  While 2019 novel coronavirus  (SARS-CoV-2) nucleic acids may be present in the submitted sample  additional confirmatory testing may be necessary for epidemiological  and / or clinical management purposes  to differentiate between  SARS-CoV-2 and other Sarbecovirus currently known to infect humans.  If clinically indicated additional testing with an alternate test  methodology ([place]) is  advised. The SARS-CoV-2 RNA is generally  detectable in upper and lower respiratory specimens during the acute  phase of infection. The expected result is Negative. Fact Sheet for Patients:  StrictlyIdeas.no Fact Sheet for Healthcare Providers: BankingDealers.co.za This test is not yet approved or cleared by the [place] FDA and has been authorized for detection and/or diagnosis of SARS-CoV-2 by FDA under an Emergency Use Authorization (EUA).  This EUA will remain in effect (meaning this test can be used) for the duration of the COVID-19 declaration under Section 564(b)(1) of the Act, 21 U.S.C. section 360bbb-3(b)(1), unless the authorization is terminated or revoked sooner. Performed at [facility], [place] [address] [place], [place] [address]      Radiological Exams on Admission: Dg Chest Portable 1 View  Result Date: 06/19/2019 CLINICAL DATA:  Fever EXAM: PORTABLE CHEST 1 VIEW COMPARISON:  November 27, 2018 FINDINGS: There is no evident edema or consolidation. Heart size and pulmonary vascularity are normal. No adenopathy. No bone lesions. IMPRESSION: No edema or consolidation. Electronically Signed   By: Lowella Grip III M.D.   On: 06/19/2019 13:10   EKG: Independently reviewed. Showed Sinus Rhythm at at rate of 86 and qTC of 430.  Assessment/Plan Active Problems:   Type II diabetes mellitus with  stage 4 chronic kidney disease (HCC)   Generalized weakness  Generalized Weakness and Deconditioning -Likely secondary to COVID-19 disease -Patient has low-grade fevers but currently is not hypoxic or requiring any supplemental oxygen -Check inflammatory markers for COVID disease; LDH was 242, triglycerides were 150, ferritin level was 1286, lactic acid level 1.3, procalcitonin level of 0.43, d-dimer was 1.49, and fibrinogen was 667 -Gentle IV fluid hydration with normal saline at 75 mL's per hour -Continue telemetry -Check urinalysis and urine culture -Blood cultures x2 are still pending -Check Head CT -We will need PT and OT to evaluate and treat  AKI on CKD Stage 4  -Patient's BUN/creatinine on admission was 45/3.00 -Has a history of BPH and urethral cancer -Given 1 L of normal saline in the ED and placed on maintenance IV fluids at 75 mL's per hour for 12 hours -Avoid nephrotoxic medications, contrast dyes, as well as hypertension if possible -Continue monitor and trend renal function repeat CMP in a.m.  Acute Fall -Patient denies Syncopal Episode -Will need PT/OT to Evaluate and Treat -Given 1 Liter of NS bolus and started on Maintenance IVF -Check orthostatic vital signs -Check Head CT w/o Contrast given unwitnessed fall -Check echocardiogram -We will place on fall precautions  COVID-19 Disease  -Has had Low grade Fevers and Generalized Weakness -Chest x-ray showed no edema or consolidation -Will place in Observation at [facility]   Obesity -Estimated body mass index is 36.21 kg/m as calculated from the following:   Height as of this encounter: 6\' 2"  (1.88 m).   Weight as of this encounter: 127.9 kg. -Weight Loss and Dietary Counseling given   Hypertension -Currently holding amlodipine due to recent fall  Hyperlipidemia -Continue with atorvastatin 40 g p.o. nightly  Elevated AST -The setting of [place] disease We will need to continue to monitor and  repeat and trend CMP -If AST and ALT severely elevated will need to hold statin  GERD -C/w Pantoprazole 40 mg po BID   Type 2 Diabetes Mellitus -Blood Glucose on Admission was 153 and CBG was 145 -Continue to Monitor and Check HbA1c in AM -Sugar remains consistently elevated will need to place on sensitive and alongside scale insulin before  meals  Hx of Aortic Stenosis -Repeat ECHOCardiogram  DVT prophylaxis: Enoxaparin 40 mg sq q24h Code Status: FULL CODE Family Communication: No family present at bedside Disposition Plan: Pending PT/OT evaluation and Improvement Consults called: None Admission status: Transfer to [facility] to the care of Dr. Aileen Fass  Severity of Illness: The appropriate patient status for this patient is OBSERVATION. Observation status is judged to be reasonable and necessary in order to provide the required intensity of service to ensure the patient's safety. The patient's presenting symptoms, physical exam findings, and initial radiographic and laboratory data in the context of their medical condition is felt to place them at decreased risk for further clinical deterioration. Furthermore, it is anticipated that the patient will be medically stable for discharge from the hospital within 2 midnights of admission. The following factors support the patient status of observation.   " The patient's presenting symptoms include Syncope and Generalized Weakness, Low Grade Fever. " The physical exam findings include Global weakness. " The initial radiographic and laboratory data are Concerning for Viral Disease and Head CT pending  Kerney Elbe, D.O. [facility] PAGER is on [place]  If 7PM-7AM, please contact night-coverage [email or web address] Password [email or web address]  06/19/2019, 6:31 PM

## 2019-06-19 NOTE — ED Triage Notes (Addendum)
Patient BIB EMS from UC due to complaints of weakness/chills x1 week. Patient fever for UC was 100.66F. Patient usually ambulatory, but requires cane to ambulate at this time. UC left patient in the room while they called EMS, and when staff returned, patient was on the floor. Patient reports he got up "and passed out" and fell at [facility]. Patient reports he hit his head on the floor. Patient denies other injury. Patient denies blood thinners. Patient reports he has not been around anyone he knows of diagnosed with Covid 19.   EMS VS: HR 88, BP 118/63, 96% RA, CBG=169.

## 2019-06-19 NOTE — ED Notes (Signed)
Patient's wife called by this RN, at patient's request, and updated on patient POC.

## 2019-06-19 NOTE — ED Notes (Signed)
ED TO INPATIENT HANDOFF REPORT  ED Nurse Name and Phone #: Tillman Abide  S Name/Age/Gender Charles Castillo. 85 y.o. male Room/Bed: WA12/WA12  Code Status   Code Status: Prior  Home/SNF/Other given to floor Patient oriented to: self, place, time and situation Is this baseline? Yes   Triage Complete: Triage complete  Chief Complaint fever  Triage Note Patient BIB EMS from UC due to complaints of weakness/chills x1 week. Patient fever for UC was 100.31F. Patient usually ambulatory, but requires cane to ambulate at this time. UC left patient in the room while they called EMS, and when staff returned, patient was on the floor. Patient reports he got up "and passed out" and fell at [facility]. Patient reports he hit his head on the floor. Patient denies other injury. Patient denies blood thinners. Patient reports he has not been around anyone he knows of diagnosed with Covid 19.   EMS VS: HR 88, BP 118/63, 96% RA, CBG=169.    Allergies No Known Allergies  Level of Care/Admitting Diagnosis ED Disposition    ED Disposition Condition [facility] Area: [place] [[id number]]  Level of Care: Telemetry [5]  Covid Evaluation: Confirmed COVID Positive  Diagnosis: Syncope [924268]  Admitting Physician: Raiford Noble LATIF [[id number]]  Attending Physician: Raiford Noble LATIF [[id number]]  PT Class (Do Not Modify): Observation [104]  PT Acc Code (Do Not Modify): Observation [10022]       B Medical/Surgery History Past Medical History:  Diagnosis Date  . Arthritis   . BPH (benign prostatic hypertrophy) with urinary obstruction   . Cancer ([place])    urethral   . CKD (chronic kidney disease), stage III ([place])   . Diet-controlled type 2 diabetes mellitus ([place])   . History of cellulitis    2012-- left lower extremitiy  . History of urinary tract part removal    2008--  partial urethrectomy for SCC  . Hyperlipidemia   . Hypertension   . Ingrown toenail  10/14/2018   Left big toe s/p removal  . Organic impotence   . OSA on CPAP    mild to moderate per study 04-19-2011  . Penile lesion   . Sleep apnea    CPAP use   Past Surgical History:  Procedure Laterality Date  . CYSTO/  BILATERAL RETROGRADE PYELOGRAM/  RESECTION PROSTATIC URETHRAL TUMOR/  EXCISION BIOPSY URETHRAL MEATUS AND MEATOTOMY/  TRANSRECTAL ULTRASOUND PROSTATE BIOPSY'S  12-05-2006  . CYSTOSCOPY N/A 06/18/2015   Procedure: CYSTOSCOPY FLEXIBLE;  Surgeon: Irine Seal, MD;  Location: [facility];  Service: Urology;  Laterality: N/A;  . PENILE BIOPSY N/A 06/18/2015   Procedure: PENILE BIOPSY;  Surgeon: Irine Seal, MD;  Location: [facility];  Service: Urology;  Laterality: N/A;  . URETHRECTOMY  05-01-2007   Partial  (squamous cell carcinoma )     A IV Location/Drains/Wounds Patient Lines/Drains/Airways Status   Active Line/Drains/Airways    Name:   Placement date:   Placement time:   Site:   Days:   Peripheral IV 06/19/19 Left Wrist   06/19/19    -    Wrist   less than 1   Incision (Closed) 06/18/15 Perineum Other (Comment)   06/18/15    1239     1462          Intake/Output Last 24 hours No intake or output data in the 24 hours ending 06/19/19 1728  Labs/Imaging Results for orders placed or performed during the  hospital encounter of 06/19/19 (from the past 48 hour(s))  CBC with Differential/Platelet     Status: Abnormal   Collection Time: 06/19/19 11:26 AM  Result Value Ref Range   WBC 5.0 4.0 - 10.5 K/uL   RBC 5.21 4.22 - 5.81 MIL/uL   Hemoglobin 15.3 13.0 - 17.0 g/dL   HCT 48.2 39.0 - 52.0 %   MCV 92.5 80.0 - 100.0 fL   MCH 29.4 26.0 - 34.0 pg   MCHC 31.7 30.0 - 36.0 g/dL   RDW 14.2 11.5 - 15.5 %   Platelets 162 150 - 400 K/uL   nRBC 0.0 0.0 - 0.2 %   Neutrophils Relative % 77 %   Neutro Abs 3.8 1.7 - 7.7 K/uL   Lymphocytes Relative 12 %   Lymphs Abs 0.6 (L) 0.7 - 4.0 K/uL   Monocytes Relative 11 %   Monocytes Absolute 0.6 0.1  - 1.0 K/uL   Eosinophils Relative 0 %   Eosinophils Absolute 0.0 0.0 - 0.5 K/uL   Basophils Relative 0 %   Basophils Absolute 0.0 0.0 - 0.1 K/uL   Immature Granulocytes 0 %   Abs Immature Granulocytes 0.01 0.00 - 0.07 K/uL    Comment: Performed at [facility], [place] [address] [place], [place] [address]  Comprehensive metabolic panel     Status: Abnormal   Collection Time: 06/19/19 11:26 AM  Result Value Ref Range   Sodium 137 135 - 145 mmol/L   Potassium 4.2 3.5 - 5.1 mmol/L   Chloride 108 98 - 111 mmol/L   CO2 19 (L) 22 - 32 mmol/L   Glucose, Bld 153 (H) 70 - 99 mg/dL   BUN 45 (H) 8 - 23 mg/dL   Creatinine, Ser 3.00 (H) 0.61 - 1.24 mg/dL   Calcium 8.9 8.9 - 10.3 mg/dL   Total Protein 7.9 6.5 - 8.1 g/dL   Albumin 3.4 (L) 3.5 - 5.0 g/dL   AST 47 (H) 15 - 41 U/L   ALT 40 0 - 44 U/L   Alkaline Phosphatase 72 38 - 126 U/L   Total Bilirubin 0.9 0.3 - 1.2 mg/dL   GFR calc non Af Amer 18 (L) >60 mL/min   GFR calc Af Amer 21 (L) >60 mL/min   Anion gap 10 5 - 15    Comment: Performed at [facility], [place] [address], [place], [place] [address]  CBG monitoring, ED     Status: Abnormal   Collection Time: 06/19/19 11:36 AM  Result Value Ref Range   Glucose-Capillary 145 (H) 70 - 99 mg/dL  Lactic acid, plasma     Status: None   Collection Time: 06/19/19 11:56 AM  Result Value Ref Range   Lactic Acid, Venous 1.3 0.5 - 1.9 mmol/L    Comment: Performed at [facility], [place] [address] [place], [place] [address]  SARS Coronavirus 2 (CEPHEID- Performed in [place] hospital lab), Hosp Order     Status: Abnormal   Collection Time: 06/19/19  1:05 PM   Specimen: Nasopharyngeal Swab  Result Value Ref Range   SARS Coronavirus 2 POSITIVE (A) NEGATIVE    Comment: RESULT CALLED TO, READ BACK BY AND VERIFIED WITH: AMANDA RN AT 1519 ON 06/19/2019 BY S.VANHOORNE (NOTE) If result is NEGATIVE SARS-CoV-2 target nucleic acids are NOT  DETECTED. The SARS-CoV-2 RNA is generally detectable in upper and lower  respiratory specimens during the acute phase of infection. The lowest  concentration of SARS-CoV-2 viral copies this  assay can detect is 250  copies / mL. A negative result does not preclude SARS-CoV-2 infection  and should not be used as the sole basis for treatment or other  patient management decisions.  A negative result may occur with  improper specimen collection / handling, submission of specimen other  than nasopharyngeal swab, presence of viral mutation(s) within the  areas targeted by this assay, and inadequate number of viral copies  (<250 copies / mL). A negative result must be combined with clinical  observations, patient history, and epidemiological information. If result is POSITIVE SARS-CoV-2 target nucleic acids are DET ECTED. The SARS-CoV-2 RNA is generally detectable in upper and lower  respiratory specimens during the acute phase of infection.  Positive  results are indicative of active infection with SARS-CoV-2.  Clinical  correlation with patient history and other diagnostic information is  necessary to determine patient infection status.  Positive results do  not rule out bacterial infection or co-infection with other viruses. If result is PRESUMPTIVE POSTIVE SARS-CoV-2 nucleic acids MAY BE PRESENT.   A presumptive positive result was obtained on the submitted specimen  and confirmed on repeat testing.  While 2019 novel coronavirus  (SARS-CoV-2) nucleic acids may be present in the submitted sample  additional confirmatory testing may be necessary for epidemiological  and / or clinical management purposes  to differentiate between  SARS-CoV-2 and other Sarbecovirus currently known to infect humans.  If clinically indicated additional testing with an alternate test  methodology ([place]) is advised. The SARS-CoV-2 RNA is generally  detectable in upper and lower respiratory specimens during  the acute  phase of infection. The expected result is Negative. Fact Sheet for Patients:  StrictlyIdeas.no Fact Sheet for Healthcare Providers: BankingDealers.co.za This test is not yet approved or cleared by the [place] FDA and has been authorized for detection and/or diagnosis of SARS-CoV-2 by FDA under an Emergency Use Authorization (EUA).  This EUA will remain in effect (meaning this test can be used) for the duration of the COVID-19 declaration under Section 564(b)(1) of the Act, 21 U.S.C. section 360bbb-3(b)(1), unless the authorization is terminated or revoked sooner. Performed at [facility], [place] [address], [place], [place] [address]   Urinalysis, Routine w reflex microscopic     Status: Abnormal   Collection Time: 06/19/19  3:00 PM  Result Value Ref Range   Color, Urine YELLOW YELLOW   APPearance HAZY (A) CLEAR   Specific Gravity, Urine 1.018 1.005 - 1.030   pH 5.0 5.0 - 8.0   Glucose, UA NEGATIVE NEGATIVE mg/dL   Hgb urine dipstick MODERATE (A) NEGATIVE   Bilirubin Urine NEGATIVE NEGATIVE   Ketones, ur NEGATIVE NEGATIVE mg/dL   Protein, ur >=300 (A) NEGATIVE mg/dL   Nitrite NEGATIVE NEGATIVE   Leukocytes,Ua NEGATIVE NEGATIVE   RBC / HPF 0-5 0 - 5 RBC/hpf   WBC, UA 6-10 0 - 5 WBC/hpf   Bacteria, UA FEW (A) NONE SEEN   Squamous Epithelial / LPF 0-5 0 - 5   Mucus PRESENT    Amorphous Crystal PRESENT     Comment: Performed at [facility], [place] [address], [place], [place] [address]   Dg Chest Portable 1 View  Result Date: 06/19/2019 CLINICAL DATA:  Fever EXAM: PORTABLE CHEST 1 VIEW COMPARISON:  November 27, 2018 FINDINGS: There is no evident edema or consolidation. Heart size and pulmonary vascularity are normal. No adenopathy. No bone lesions. IMPRESSION: No edema or consolidation. Electronically Signed   By:  Lowella Grip III M.D.   On: 06/19/2019 13:10    Pending  Labs Unresulted Labs (From admission, onward)    Start     Ordered   06/19/19 1549  D-dimer, quantitative  ONCE - STAT,   STAT    Comments: Used for prognosis and bed placement. Do not order CT or V/Q.    06/19/19 1549   06/19/19 1549  Procalcitonin  ONCE - STAT,   STAT     06/19/19 1549   06/19/19 1549  Lactate dehydrogenase  Once,   STAT     06/19/19 1549   06/19/19 1549  Ferritin  Once,   STAT     06/19/19 1549   06/19/19 1549  Triglycerides  Once,   STAT     06/19/19 1549   06/19/19 1549  Fibrinogen  Once,   STAT     06/19/19 1549   06/19/19 1549  C-reactive protein  Once,   STAT     06/19/19 1549   06/19/19 1202  Urine culture  ONCE - STAT,   STAT     06/19/19 1201   06/19/19 1104  Blood culture (routine x 2)  BLOOD CULTURE X 2,   STAT     06/19/19 1103   Signed and Held  ABO/Rh  Once,   R     Signed and Held   Signed and Held  Respiratory Panel by PCR  Add-on,   R     Signed and Held   Signed and Held  Influenza panel by PCR (type A & B)  Add-on,   R     Signed and Held   Signed and Held  Comprehensive metabolic panel  Daily,   R     Signed and Held   Signed and Held  C-reactive protein  Daily,   R     Signed and Held   Signed and Held  CK  Daily,   R     Signed and Held   Signed and Held  D-dimer, quantitative (not at [facility])  Daily,   R     Signed and Held   Signed and Held  Ferritin  Daily,   R     Signed and Held   Signed and Held  Magnesium  Daily,   R     Signed and Held   Signed and Held  Phosphorus  Daily,   R     Signed and Occupational psychologist and Held  Culture, blood (Routine X 2) w Reflex to ID Panel  BLOOD CULTURE X 2,   R     Signed and Held   Signed and Held  TSH  Add-on,   R     Signed and Held   Signed and Held  Troponin I (High Sensitivity)  STAT Now then every 2 hours,   R    Question Answer Comment  Indication Other   Specify indication Syncope      Signed and Held   Signed and Held  CBC  (enoxaparin (LOVENOX)    CrCl >/= 30 ml/min)  Once,   R     Comments: Baseline for enoxaparin therapy IF NOT ALREADY DRAWN.  Notify MD if PLT < 100 K.    Signed and Held   Signed and Held  Creatinine, serum  (enoxaparin (LOVENOX)    CrCl >/= 30 ml/min)  Once,   R    Comments: Baseline for enoxaparin therapy IF NOT ALREADY DRAWN.  Signed and Held   Signed and Held  Creatinine, serum  (enoxaparin (LOVENOX)    CrCl >/= 30 ml/min)  Weekly,   R    Comments: while on enoxaparin therapy    Signed and Held          Vitals/Pain Today's Vitals   06/19/19 1430 06/19/19 1600 06/19/19 1630 06/19/19 1700  BP: (!) 153/91 (!) 160/92 139/90 (!) 155/101  Pulse: 81 81 82 87  Resp: (!) 25 (!) 24 (!) 26 (!) 22  Temp:      TempSrc:      SpO2: 98% 99% 99% 98%  Weight:      Height:        Isolation Precautions Airborne and Contact precautions  Medications Medications  sodium chloride 0.9 % bolus 1,000 mL (1,000 mLs Intravenous Bolus 06/19/19 1137)    Mobility non-ambulatory High fall risk   Focused Assessments Cardiac Assessment Handoff:  Cardiac Rhythm: Normal sinus rhythm No results found for: CKTOTAL, CKMB, CKMBINDEX, TROPONINI No results found for: DDIMER Does the Patient currently have chest pain? Yes   , Respiratory Assessment    R Recommendations: See Admitting Provider Note  Report given to:   Additional Notes:

## 2019-06-19 NOTE — Progress Notes (Signed)
ADDENDUM:  While waiting on EMS, staff in the office heard a large thud in his exam room and heard the patient yelling for help.  When we entered the exam room we found patient lying on his back on the floor.  Patient stated that he had tried to climb onto the exam table because he was started sitting in his wheelchair and his legs gave out to him and then he fell into the wall and onto the floor.  Denied any joint pain or head pain.  Did not lose consciousness.  Did not want [place] to attempt to lift him.  EMS arrived shortly afterwards and was able to transport patient into stretcher and onto the ambulance.

## 2019-06-19 NOTE — ED Notes (Signed)
Bed: EZ66 Expected date:  Expected time:  Means of arrival:  Comments: EMS/elderly/fever

## 2019-06-19 NOTE — ED Notes (Signed)
X-ray at bedside

## 2019-06-19 NOTE — ED Provider Notes (Signed)
[facility] DEPT Provider Note   CSN: [id number] Arrival date & time: 06/19/19  1052     History   Chief Complaint Chief Complaint  Patient presents with  . Fever  . Weakness  . Loss of Consciousness    HPI Charles Castillo. is a 85 y.o. male.     HPI Patient is an 85 year old male presents the emergency department with generalized weakness lethargy chills and low-grade fever over the past week.  He went to urgent care today for evaluation became lightheaded when he stood up and had a syncopal episode.  No preceding chest pain or palpitations.  No significant head injury.  Denies neck pain and headache at this time.  Denies weakness of his arms or legs.  No chest pain.  No significant cough or shortness of breath.  Denies abdominal pain.  No nausea vomiting or diarrhea.  Denies melena.  No recent sick contacts.  No use of anticoagulants.   Past Medical History:  Diagnosis Date  . Arthritis   . BPH (benign prostatic hypertrophy) with urinary obstruction   . Cancer ([place])    urethral   . CKD (chronic kidney disease), stage III ([place])   . Diet-controlled type 2 diabetes mellitus ([place])   . History of cellulitis    2012-- left lower extremitiy  . History of urinary tract part removal    2008--  partial urethrectomy for SCC  . Hyperlipidemia   . Hypertension   . Ingrown toenail 10/14/2018   Left big toe s/p removal  . Organic impotence   . OSA on CPAP    mild to moderate per study 04-19-2011  . Penile lesion   . Sleep apnea    CPAP use    Patient Active Problem List   Diagnosis Date Noted  . Secondary hyperparathyroidism ([place]) 04/10/2019  . Mild aortic stenosis 10/25/2018  . Dysphagia 10/30/2017  . Syncope 05/23/2017  . Gout 05/24/2016  . Erectile dysfunction 09/15/2014  . Type II diabetes mellitus with stage 4 chronic kidney disease ([place]) 09/15/2014  . Former smoker 09/15/2014  . Morbid obesity ([place]) 10/10/2012  . Urethral carcinoma  ([place]) 11/04/2009  . CKD (chronic kidney disease), stage IV ([place]) 03/24/2008  . Hyperlipidemia associated with type 2 diabetes mellitus ([place]) 08/27/2007  . Hypertension associated with diabetes ([place]) 07/03/2007  . Osteoarthritis 07/03/2007  . Sleep apnea 07/03/2007    Past Surgical History:  Procedure Laterality Date  . CYSTO/  BILATERAL RETROGRADE PYELOGRAM/  RESECTION PROSTATIC URETHRAL TUMOR/  EXCISION BIOPSY URETHRAL MEATUS AND MEATOTOMY/  TRANSRECTAL ULTRASOUND PROSTATE BIOPSY'S  12-05-2006  . CYSTOSCOPY N/A 06/18/2015   Procedure: CYSTOSCOPY FLEXIBLE;  Surgeon: Irine Seal, MD;  Location: [facility];  Service: Urology;  Laterality: N/A;  . PENILE BIOPSY N/A 06/18/2015   Procedure: PENILE BIOPSY;  Surgeon: Irine Seal, MD;  Location: [facility];  Service: Urology;  Laterality: N/A;  . URETHRECTOMY  05-01-2007   Partial  (squamous cell carcinoma )        Home Medications    Prior to Admission medications   Medication Sig Start Date End Date Taking? Authorizing Provider  amLODipine (NORVASC) 5 MG tablet Take 1 tablet (5 mg total) by mouth daily. 01/14/19   Marin Olp, MD  atorvastatin (LIPITOR) 40 MG tablet Take 1 tablet (40 mg total) by mouth daily. 01/14/19   Marin Olp, MD  pantoprazole (PROTONIX) 40 MG tablet Take 1 tablet (40 mg total) by mouth 2 (  two) times daily. 03/06/19   Pyrtle, Lajuan Lines, MD    Family History Family History  Problem Relation Age of Onset  . Heart failure Father        CHF, no history MI  . Colon cancer Neg Hx   . Stomach cancer Neg Hx   . Esophageal cancer Neg Hx     Social History Social History   Tobacco Use  . Smoking status: Former Smoker    Packs/day: 1.00    Years: 25.00    Pack years: 25.00    Types: Cigarettes    Quit date: 12/19/1968    Years since quitting: 50.5  . Smokeless tobacco: Never Used  Substance Use Topics  . Alcohol use: No    Alcohol/week: 0.0 standard drinks  . Drug use: No      Allergies   Patient has no known allergies.   Review of Systems Review of Systems  All other systems reviewed and are negative.    Physical Exam Updated Vital Signs BP (!) 149/80   Pulse 80   Temp 99.7 F (37.6 C) (Oral)   Resp (!) 26   Ht 6\' 2"  (1.88 m)   Wt 127.9 kg   SpO2 99%   BMI 36.21 kg/m   Physical Exam Vitals signs and nursing note reviewed.  Constitutional:      Appearance: He is well-developed.  HENT:     Head: Normocephalic and atraumatic.  Neck:     Musculoskeletal: Normal range of motion.  Cardiovascular:     Rate and Rhythm: Normal rate and regular rhythm.     Heart sounds: Normal heart sounds.  Pulmonary:     Effort: Pulmonary effort is normal. No respiratory distress.     Breath sounds: Normal breath sounds.  Abdominal:     General: There is no distension.     Palpations: Abdomen is soft.     Tenderness: There is no abdominal tenderness.  Musculoskeletal: Normal range of motion.  Skin:    General: Skin is warm and dry.  Neurological:     Mental Status: He is alert and oriented to person, place, and time.  Psychiatric:        Judgment: Judgment normal.      ED Treatments / Results  Labs (all labs ordered are listed, but only abnormal results are displayed) Labs Reviewed  CBC WITH DIFFERENTIAL/PLATELET - Abnormal; Notable for the following components:      Result Value   Lymphs Abs 0.6 (*)    All other components within normal limits  COMPREHENSIVE METABOLIC PANEL - Abnormal; Notable for the following components:   CO2 19 (*)    Glucose, Bld 153 (*)    BUN 45 (*)    Creatinine, Ser 3.00 (*)    Albumin 3.4 (*)    AST 47 (*)    GFR calc non Af Amer 18 (*)    GFR calc Af Amer 21 (*)    All other components within normal limits  CBG MONITORING, ED - Abnormal; Notable for the following components:   Glucose-Capillary 145 (*)    All other components within normal limits  CULTURE, BLOOD (ROUTINE X 2)  CULTURE, BLOOD (ROUTINE X 2)   URINE CULTURE  SARS CORONAVIRUS 2 (HOSPITAL ORDER, [facility])  LACTIC ACID, PLASMA  URINALYSIS, ROUTINE W REFLEX MICROSCOPIC    EKG None  Radiology Dg Chest Portable 1 View  Result Date: 06/19/2019 CLINICAL DATA:  Fever EXAM: PORTABLE CHEST 1  VIEW COMPARISON:  November 27, 2018 FINDINGS: There is no evident edema or consolidation. Heart size and pulmonary vascularity are normal. No adenopathy. No bone lesions. IMPRESSION: No edema or consolidation. Electronically Signed   By: Lowella Grip III M.D.   On: 06/19/2019 13:10    Procedures Procedures (including critical care time)  Medications Ordered in ED Medications  sodium chloride 0.9 % bolus 1,000 mL (1,000 mLs Intravenous Bolus 06/19/19 1137)     Initial Impression / Assessment and Plan / ED Course  I have reviewed the triage vital signs and the nursing notes.  Pertinent labs & imaging results that were available during my care of the patient were reviewed by me and considered in my medical decision making (see chart for details).        Patient with dehydration and acute generalized weakness.  Wife reports requiring significant assistance to get up out of bed and out of the house today to the urgent care where he had a syncopal episode.  Acute on chronic renal insufficiency.  Likely acute dehydration.  Awaiting COVID-19 testing and urinalysis at this time.  Ultimate plan for admission to the hospital.  Care transferred to Dr. Regenia Skeeter  Final Clinical Impressions(s) / ED Diagnoses   Final diagnoses:  None    ED Discharge Orders    None       Jola Schmidt, MD 06/19/19 1511

## 2019-06-19 NOTE — Addendum Note (Signed)
Addended by: Vivi Barrack on: 06/19/2019 09:52 AM   Modules accepted: Level of Service

## 2019-06-19 NOTE — ED Provider Notes (Signed)
Care transferred to me.  Patient's novel coronavirus test is positive.  This would likely explain his overall weakness and low grade fever.  Given the mild AKI and his syncopal episode at urgent care I think he will need admission for supportive care/resuscitation. D/w hospitalist, will admit to [facility]. Covid labs ordered.  Charles Castillo. was evaluated in Emergency Department on 06/19/2019 for the symptoms described in the history of present illness. He was evaluated in the context of the global COVID-19 pandemic, which necessitated consideration that the patient might be at risk for infection with the SARS-CoV-2 virus that causes COVID-19. Institutional protocols and algorithms that pertain to the evaluation of patients at risk for COVID-19 are in a state of rapid change based on information released by regulatory bodies including the CDC and federal and state organizations. These policies and algorithms were followed during the patient's care in the ED.    EKG Interpretation  Date/Time:  Wednesday June 19 2019 11:21:28 EDT Ventricular Rate:  86 PR Interval:    QRS Duration: 103 QT Interval:  359 QTC Calculation: 430 R Axis:   113 Text Interpretation:  Sinus rhythm Consider left atrial enlargement Probable right ventricular hypertrophy no significant change since June 2018 Confirmed by Sherwood Gambler [phone]) on 06/19/2019 3:48:23 PM         Sherwood Gambler, MD 06/19/19 [phone]

## 2019-06-19 NOTE — ED Notes (Addendum)
Unable to draw draw extra lavender, and gold tubes. Also unable to place second IV after 2x attempts.

## 2019-06-19 NOTE — Progress Notes (Signed)
   Chief Complaint:  Charles Castillo. is a 85 y.o. male who presents today with a chief complaint of weakness.   Assessment/Plan:  Weakness/Hypotension/Fever Patient's constellation of symptoms raise concern for sepsis of unknown etiology.   He is at high risk for serious illness that poses a significant risk to life or bodily function.  Given his fever and low blood pressure readings, recommended that patient go to the ED via EMS for further evaluation and management. He agreed with this plan.   Of note, wife is currently being tested for COVID and has not yet gotten her test results back.  Patient does not have any respiratory symptoms however does have documented fever here.  Subjective:  HPI:  Weakness Patient had virtual visit yesterday with complaints of weakness and difficulty getting out of his chair.  He is here today for an office visit.  Symptoms have worsened since yesterday.  Feels very fatigued and malaise.  Does not currently have any other symptoms.  No reported dysuria.  Has urinary frequency that is at baseline.  No reported cough or shortness of breath. No treatments tried. Weakness is worth with minimal exertion. No other obvious alleviating or aggravating factors.   ROS: Per HPI, otherwise complete ROS negative.   JOA:CZYSAYT of urethral carcinoma and stage 4 CKD.  Social history: He reports that he quit smoking about 50 years ago. His smoking use included cigarettes. He has a 25.00 pack-year smoking history. He has never used smokeless tobacco. He reports that he does not drink alcohol or use drugs.      Objective:  Physical Exam: BP 96/67 (BP Location: Left Arm, Patient Position: Sitting, Cuff Size: Large)   Pulse 91   Temp (!) 100.5 F (38.1 C) (Oral)   SpO2 96%   Gen: NAD, ill appearing in NAD. Resting in wheelchair.      Charles Castillo. Charles Pain, MD 06/19/2019 9:51 AM   ADDENDUM:  While waiting on EMS, staff in the office heard a large thud in  his exam room and heard the patient yelling for help.  When we entered the exam room we found patient lying on his back on the floor.  Patient stated that he had tried to climb onto the exam table because he was started sitting in his wheelchair and his legs gave out to him and then he fell into the wall and onto the floor.  Denied any joint Castillo or head Castillo.  Did not lose consciousness.  Did not want [place] to attempt to lift him.  EMS arrived shortly afterwards and was able to transport patient into stretcher and onto the ambulance.

## 2019-06-20 ENCOUNTER — Observation Stay (HOSPITAL_COMMUNITY): Payer: Medicare Other

## 2019-06-20 DIAGNOSIS — R531 Weakness: Secondary | ICD-10-CM | POA: Diagnosis not present

## 2019-06-20 DIAGNOSIS — R55 Syncope and collapse: Secondary | ICD-10-CM | POA: Diagnosis not present

## 2019-06-20 DIAGNOSIS — R740 Nonspecific elevation of levels of transaminase and lactic acid dehydrogenase [LDH]: Secondary | ICD-10-CM | POA: Diagnosis present

## 2019-06-20 DIAGNOSIS — G4733 Obstructive sleep apnea (adult) (pediatric): Secondary | ICD-10-CM | POA: Diagnosis present

## 2019-06-20 DIAGNOSIS — Z6836 Body mass index (BMI) 36.0-36.9, adult: Secondary | ICD-10-CM | POA: Diagnosis not present

## 2019-06-20 DIAGNOSIS — N184 Chronic kidney disease, stage 4 (severe): Secondary | ICD-10-CM | POA: Diagnosis not present

## 2019-06-20 DIAGNOSIS — Z85828 Personal history of other malignant neoplasm of skin: Secondary | ICD-10-CM | POA: Diagnosis not present

## 2019-06-20 DIAGNOSIS — U071 COVID-19: Secondary | ICD-10-CM | POA: Diagnosis not present

## 2019-06-20 DIAGNOSIS — Z713 Dietary counseling and surveillance: Secondary | ICD-10-CM | POA: Diagnosis not present

## 2019-06-20 DIAGNOSIS — E785 Hyperlipidemia, unspecified: Secondary | ICD-10-CM | POA: Diagnosis present

## 2019-06-20 DIAGNOSIS — M1990 Unspecified osteoarthritis, unspecified site: Secondary | ICD-10-CM | POA: Diagnosis not present

## 2019-06-20 DIAGNOSIS — I951 Orthostatic hypotension: Secondary | ICD-10-CM | POA: Diagnosis not present

## 2019-06-20 DIAGNOSIS — Z8249 Family history of ischemic heart disease and other diseases of the circulatory system: Secondary | ICD-10-CM | POA: Diagnosis not present

## 2019-06-20 DIAGNOSIS — W07XXXA Fall from chair, initial encounter: Secondary | ICD-10-CM | POA: Diagnosis present

## 2019-06-20 DIAGNOSIS — E1165 Type 2 diabetes mellitus with hyperglycemia: Secondary | ICD-10-CM | POA: Diagnosis not present

## 2019-06-20 DIAGNOSIS — E860 Dehydration: Secondary | ICD-10-CM | POA: Diagnosis present

## 2019-06-20 DIAGNOSIS — E1122 Type 2 diabetes mellitus with diabetic chronic kidney disease: Secondary | ICD-10-CM | POA: Diagnosis not present

## 2019-06-20 DIAGNOSIS — I350 Nonrheumatic aortic (valve) stenosis: Secondary | ICD-10-CM

## 2019-06-20 DIAGNOSIS — Z87891 Personal history of nicotine dependence: Secondary | ICD-10-CM | POA: Diagnosis not present

## 2019-06-20 DIAGNOSIS — Z8559 Personal history of malignant neoplasm of other urinary tract organ: Secondary | ICD-10-CM | POA: Diagnosis not present

## 2019-06-20 DIAGNOSIS — K219 Gastro-esophageal reflux disease without esophagitis: Secondary | ICD-10-CM | POA: Diagnosis present

## 2019-06-20 DIAGNOSIS — T380X5A Adverse effect of glucocorticoids and synthetic analogues, initial encounter: Secondary | ICD-10-CM | POA: Diagnosis not present

## 2019-06-20 DIAGNOSIS — Z79899 Other long term (current) drug therapy: Secondary | ICD-10-CM | POA: Diagnosis not present

## 2019-06-20 DIAGNOSIS — I129 Hypertensive chronic kidney disease with stage 1 through stage 4 chronic kidney disease, or unspecified chronic kidney disease: Secondary | ICD-10-CM | POA: Diagnosis present

## 2019-06-20 DIAGNOSIS — R0602 Shortness of breath: Secondary | ICD-10-CM | POA: Diagnosis not present

## 2019-06-20 DIAGNOSIS — Y92239 Unspecified place in hospital as the place of occurrence of the external cause: Secondary | ICD-10-CM | POA: Diagnosis not present

## 2019-06-20 DIAGNOSIS — N179 Acute kidney failure, unspecified: Secondary | ICD-10-CM | POA: Diagnosis not present

## 2019-06-20 DIAGNOSIS — N401 Enlarged prostate with lower urinary tract symptoms: Secondary | ICD-10-CM | POA: Diagnosis not present

## 2019-06-20 LAB — MAGNESIUM: Magnesium: 2.1 mg/dL (ref 1.7–2.4)

## 2019-06-20 LAB — GLUCOSE, CAPILLARY
Glucose-Capillary: 127 mg/dL — ABNORMAL HIGH (ref 70–99)
Glucose-Capillary: 156 mg/dL — ABNORMAL HIGH (ref 70–99)

## 2019-06-20 LAB — COMPREHENSIVE METABOLIC PANEL
ALT: 35 U/L (ref 0–44)
AST: 47 U/L — ABNORMAL HIGH (ref 15–41)
Albumin: 3.2 g/dL — ABNORMAL LOW (ref 3.5–5.0)
Alkaline Phosphatase: 64 U/L (ref 38–126)
Anion gap: 12 (ref 5–15)
BUN: 42 mg/dL — ABNORMAL HIGH (ref 8–23)
CO2: 17 mmol/L — ABNORMAL LOW (ref 22–32)
Calcium: 8.7 mg/dL — ABNORMAL LOW (ref 8.9–10.3)
Chloride: 111 mmol/L (ref 98–111)
Creatinine, Ser: 2.93 mg/dL — ABNORMAL HIGH (ref 0.61–1.24)
GFR calc Af Amer: 22 mL/min — ABNORMAL LOW (ref >60)
GFR calc non Af Amer: 19 mL/min — ABNORMAL LOW (ref >60)
Glucose, Bld: 124 mg/dL — ABNORMAL HIGH (ref 70–99)
Potassium: 4.1 mmol/L (ref 3.5–5.1)
Sodium: 140 mmol/L (ref 135–145)
Total Bilirubin: 0.5 mg/dL (ref 0.3–1.2)
Total Protein: 7 g/dL (ref 6.5–8.1)

## 2019-06-20 LAB — FERRITIN: Ferritin: 1242 ng/mL — ABNORMAL HIGH (ref 24–336)

## 2019-06-20 LAB — C-REACTIVE PROTEIN: CRP: 5.7 mg/dL — ABNORMAL HIGH (ref <1.0)

## 2019-06-20 LAB — CK: Total CK: 438 U/L — ABNORMAL HIGH (ref 49–397)

## 2019-06-20 LAB — URINE CULTURE: Culture: NO GROWTH

## 2019-06-20 LAB — TSH: TSH: 1.617 u[IU]/mL (ref 0.350–4.500)

## 2019-06-20 LAB — D-DIMER, QUANTITATIVE: D-Dimer, Quant: 1.72 ug/mL-FEU — ABNORMAL HIGH (ref 0.00–0.50)

## 2019-06-20 LAB — PHOSPHORUS: Phosphorus: 2.8 mg/dL (ref 2.5–4.6)

## 2019-06-20 LAB — TROPONIN I (HIGH SENSITIVITY): Troponin I (High Sensitivity): 16 ng/L (ref <18)

## 2019-06-20 NOTE — Progress Notes (Addendum)
Received call from radiology.  There was an order for a 2-view chest x-ray.  [facility] can only perform a 1-view chest x-ray.  This RN paged Dr. Jackqulyn Livings to asked if a 1-view chest x-ray was acceptable.  He agreed and an order for a 1-view chest x-ray was entered.

## 2019-06-20 NOTE — Progress Notes (Signed)
Occupational Therapy Evaluation Patient Details Name: Charles Castillo. MRN: [id number] DOB: [birth year] Today's Date: 06/20/2019    History of Present Illness Charles Castillo. is an 85 y.o. male past medical history o BPH, urethral cancer status post ureterectomy, chronic kidney disease, aortic stenosis, obesity osteoarthritis presents with weakness , fall at [facility]   Clinical Impression   PTA, pt independent with ADL and mobility. Pt recently started using the RW due to weakness. Pt had a fall at urgent care. Pt demonstrates a functional decline is a high fall risk. Pt's wife also has Covid and is at home. Pt states he does not have other famliy to assist. Pt able to ambulate short distance to chair and requires mod A with LB ADL. Appears a little confused at the beginning of the session, asking to return to bed when he was still in bed. At this time, pt would benefit from rehab at [facility]. If pt progresses, he may be able to DC home with [facility] Educated pt on use of IS and theraband exercises.     Follow Up Recommendations  SNF;Supervision/Assistance - 24 hour    Equipment Recommendations  None recommended by OT    Recommendations for Other Services       Precautions / Restrictions Precautions Precautions: Fall Precaution Comments: R hip pain      Mobility Bed Mobility Overal bed mobility: Needs Assistance Bed Mobility: Rolling;Sidelying to Sit Rolling: Min assist Sidelying to sit: Mod assist       General bed mobility comments: assist with trunk  to sit up  Transfers Overall transfer level: Needs assistance Equipment used: Rolling walker (2 wheeled) Transfers: Sit to/from Stand Sit to Stand: Min assist         General transfer comment: steady assist to rise from raised bed,    Balance Overall balance assessment: Needs assistance;History of Falls Sitting-balance support: Feet supported;Bilateral upper extremity supported Sitting balance-Leahy Scale: Fair      Standing balance support: During functional activity;Bilateral upper extremity supported Standing balance-Leahy Scale: Poor Standing balance comment: reliant on UE's                           ADL either performed or assessed with clinical judgement   ADL Overall ADL's : Needs assistance/impaired     Grooming: Set up;Sitting   Upper Body Bathing: Set up;Sitting   Lower Body Bathing: Moderate assistance;Sit to/from stand   Upper Body Dressing : Set up;Sitting   Lower Body Dressing: Moderate assistance;Sit to/from stand   Toilet Transfer: Minimal assistance;+2 for safety/equipment;Ambulation;RW   Toileting- Clothing Manipulation and Hygiene: Maximal assistance Toileting - Clothing Manipulation Details (indicate cue type and reason): bed saturated with urine. A to clean pt as pt is guarded with releasing RW     Functional mobility during ADLs: Minimal assistance;+2 for physical assistance;Rolling walker;Cueing for safety General ADL Comments: Unable to reach feet due to pain/weakness     Vision         Perception     Praxis      Pertinent Vitals/Pain Pain Assessment: Faces Pain Score: 8  Faces Pain Scale: Hurts little more Pain Location: " when I roll over onto right hip" Hurts Like hell", Bwaeing weight to amb was <4 Pain Descriptors / Indicators: Discomfort;Grimacing;Guarding Pain Intervention(s): Monitored during session     Hand Dominance Right   Extremity/Trunk Assessment Upper Extremity Assessment Upper Extremity Assessment: Generalized weakness   Lower Extremity Assessment Lower  Extremity Assessment: Generalized weakness RLE Deficits / Details: AROM WFL  at the hip   Cervical / Trunk Assessment Cervical / Trunk Assessment: Normal   Communication Communication Communication: No difficulties   Cognition Arousal/Alertness: Awake/alert Behavior During Therapy: Flat affect;WFL for tasks assessed/performed Overall Cognitive Status:  Impaired/Different from baseline Area of Impairment: Orientation                 Orientation Level: Time Current Attention Level: Sustained Memory: Decreased short-term memory   Safety/Judgement: Decreased awareness of safety     General Comments: Appeares minimally confused. Improved throughtou session. Pt asking to get back to bed when he was already lying in bed   General Comments       Exercises Exercises: Other exercises;General Upper Extremity General Exercises - Upper Extremity Shoulder Flexion: Strengthening;10 reps;Seated;Both;Theraband Theraband Level (Shoulder Flexion): Level 1 (Yellow) Shoulder Extension: Strengthening;Both;10 reps;Seated;Theraband Theraband Level (Shoulder Extension): Level 1 (Yellow) Shoulder ABduction: Strengthening;Both;10 reps;Seated;Theraband Theraband Level (Shoulder Abduction): Level 1 (Yellow) Elbow Flexion: Strengthening;Both;10 reps;Seated;Theraband Theraband Level (Elbow Flexion): Level 1 (Yellow) Elbow Extension: Strengthening;Both;10 reps;Seated;Theraband Theraband Level (Elbow Extension): Level 1 (Yellow) Other Exercises Other Exercises: incentive spirometer x 10   Shoulder Instructions      Home Living Family/patient expects to be discharged to:: Private residence Living Arrangements: Spouse/significant other Available Help at Discharge: Family Type of Home: House Home Access: Stairs to enter Technical brewer of Steps: 3 Entrance Stairs-Rails: Right Home Layout: Two level;Able to live on main level with bedroom/bathroom     Bathroom Shower/Tub: Occupational psychologist: Handicapped height Bathroom Accessibility: Yes How Accessible: Accessible via walker Home Equipment: Shower seat - built in;Grab bars - tub/shower;Cane - single point;Walker - 2 wheels   Additional Comments: wife + covid at home      Prior Functioning/Environment Level of Independence: Independent with assistive device(s)         Comments: was driving        OT Problem List: Decreased strength;Decreased activity tolerance;Impaired balance (sitting and/or standing);Decreased range of motion;Decreased safety awareness;Decreased knowledge of precautions;Pain      OT Treatment/Interventions: Self-care/ADL training;Therapeutic exercise;Neuromuscular education;Energy conservation;DME and/or AE instruction;Patient/family education;Balance training    OT Goals(Current goals can be found in the care plan section) Acute Rehab OT Goals Patient Stated Goal: to go home OT Goal Formulation: With patient Time For Goal Achievement: 07/04/19 Potential to Achieve Goals: Good  OT Frequency: Min 3X/week   Barriers to D/C:            Co-evaluation PT/OT/SLP Co-Evaluation/Treatment: Yes Reason for Co-Treatment: For patient/therapist safety PT goals addressed during session: Mobility/safety with mobility OT goals addressed during session: ADL's and self-care;Strengthening/ROM      AM-PAC OT "6 Clicks" Daily Activity     Outcome Measure Help from another person eating meals?: None Help from another person taking care of personal grooming?: A Little Help from another person toileting, which includes using toliet, bedpan, or urinal?: A Lot Help from another person bathing (including washing, rinsing, drying)?: A Lot Help from another person to put on and taking off regular upper body clothing?: A Little Help from another person to put on and taking off regular lower body clothing?: A Lot 6 Click Score: 16   End of Session Equipment Utilized During Treatment: Rolling walker Nurse Communication: Mobility status;Other (comment)(complaints of R hip pain)  Activity Tolerance: Patient tolerated treatment well Patient left: in chair;with call bell/phone within reach;with chair alarm set  OT Visit Diagnosis: Unsteadiness on feet (R26.81);Other abnormalities  of gait and mobility (R26.89);Muscle weakness (generalized)  (M62.81);History of falling (Z91.81);Other symptoms and signs involving cognitive function;Pain Pain - Right/Left: Right Pain - part of body: Hip                Time: 0920-0957 OT Time Calculation (min): 37 min Charges:  OT General Charges $OT Visit: 1 Visit OT Evaluation $OT Eval Moderate Complexity: [place], OT/L   Acute OT Clinical Specialist [place] Pager [phone] Office [phone]   [facility] 06/20/2019, 2:46 PM

## 2019-06-20 NOTE — Progress Notes (Signed)
Echocardiogram cancelled per Dr. Sallyanne Kuster.  Patient had echo in November 2019 and it does not need to be repeated until Covid infection is clear.  Please contact Dr. Sallyanne Kuster with any questions or concerns.

## 2019-06-20 NOTE — Plan of Care (Signed)
  Problem: Education: Goal: Knowledge of General Education information will improve Description: Including pain rating scale, medication(s)/side effects and non-pharmacologic comfort measures Outcome: Progressing   Problem: Nutrition: Goal: Adequate nutrition will be maintained Outcome: Progressing   Problem: Coping: Goal: Level of anxiety will decrease Outcome: Progressing   Problem: Pain Managment: Goal: General experience of comfort will improve Outcome: Progressing   Problem: Activity: Goal: Risk for activity intolerance will decrease Outcome: Not Progressing

## 2019-06-20 NOTE — Progress Notes (Addendum)
[facility] PROGRESS NOTE    Progress Note  Charles Castillo.  EZM:[id number] DOB: January 29, 1934 DOA: 06/19/2019 PCP: Marin Olp, MD     Brief Narrative:   Charles Castillo. is an 85 y.o. 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  Relates some low-grade temps, he decided to go to urgent care and he almost fell trying to get out of the chair there was no loss of consciousness he denied any lightheadedness or dizziness.  Assessment/Plan:   Generalized weakness and deconditioning likely secondary to COVID-19: He has multiple risk factors for decompensation and to organ failure including but not limited to being a male history of cancer, hypertension and obesity. Patient is having low-grade temp currently not hypoxic. He was started on normal saline, urinalysis showed no acute infection, blood cultures are pending. I have personally chest x-ray showed increased opacity in the right lower lobe which appears to be new compared to previous x-ray the day before. Physical therapy and Occupational Therapy has been consulted. CT of the head shows no acute findings.  Acute kidney injury on CKD (chronic kidney disease), stage IV (HCC) Creatinine back in May 2019 was around 2.3-2.5. On admission it was 3 likely hemodynamically mediated, he was started on IV normal saline basic metabolic panel minimal improvement in his creatinine continue IV fluids for an additional 24 hours.  Acute fall/orthostatic hypotension: Patient denied on history any loss of consciousness. Orthostatic vitals on admission were positive. CT head showed no acute findings, echocardiogram is pending.  Morbid obesity ([place]) Counseling.   Essential hypertension:  Antihypertensive medications were held at admission he was started on IV fluids.  Elevated LFTs: Likely due to COVID-19. Continue statins for now.  Type II diabetes mellitus with stage 4 chronic kidney disease ([place]): According to patient is diet controlled, will check CBG fasting this morning and check an A1c  Mild aortic stenosis Moderate by echocardiogram 2019   DVT prophylaxis: lovenox Family Communication:none Disposition Plan/Barrier to D/C: Unable to determine Code Status:     Code Status Orders  (From admission, onward)         Start     Ordered   06/19/19 2130  Full code  Continuous     06/19/19 2129        Code Status History    Date Active Date Inactive Code Status Order ID Comments User Context   06/19/2019 2129 06/19/2019 2129 Full Code [id number]  Kerney Elbe, DO Inpatient   05/23/2017 1750 05/27/2017 1848 Full Code [id number]  Janece Canterbury, MD Inpatient   Advance Care Planning Activity        IV Access:    Peripheral IV   Procedures and diagnostic studies:   Ct Head Wo Contrast  Result Date: 06/19/2019 CLINICAL DATA:  Neuro deficits.  Subacute head trauma.  Ataxia. EXAM: CT HEAD WITHOUT CONTRAST TECHNIQUE: Contiguous axial images were obtained from the base of the skull through the vertex without intravenous contrast. COMPARISON:  None. FINDINGS: Brain: No evidence of acute infarction, hemorrhage, hydrocephalus, extra-axial collection or mass lesion/mass effect. There are microvascular ischemic changes. There is significant volume loss. Vascular: No hyperdense vessel or unexpected calcification. Skull: Normal. Negative for fracture or focal lesion. Sinuses/Orbits: No acute finding. The patient is status post bilateral cataract  surgery. Other: None. IMPRESSION: 1. No acute intracranial abnormality. 2. There is moderate atrophy with chronic microvascular ischemic changes. Electronically Signed   By: Constance Holster M.D.   On: 06/19/2019 20:02   Dg  Chest Port 1 View  Result Date: 06/20/2019 CLINICAL DATA:  Shortness of breath EXAM: PORTABLE CHEST 1 VIEW COMPARISON:  Yesterday FINDINGS: Artifact from EKG leads. Normal heart size and mediastinal contours. Interstitial coarsening which is increased, with possible early nodular airspace density at the right base. IMPRESSION: Increasing interstitial opacity. Electronically Signed   By: Monte Fantasia M.D.   On: 06/20/2019 05:54   Dg Chest Portable 1 View  Result Date: 06/19/2019 CLINICAL DATA:  Fever EXAM: PORTABLE CHEST 1 VIEW COMPARISON:  November 27, 2018 FINDINGS: There is no evident edema or consolidation. Heart size and pulmonary vascularity are normal. No adenopathy. No bone lesions. IMPRESSION: No edema or consolidation. Electronically Signed   By: Lowella Grip III M.D.   On: 06/19/2019 13:10     Medical Consultants:    None.  Anti-Infectives:   None  Subjective:    Charles Castillo. he relates he feels fatigue and tired his respiration seems to be at baseline according to the patient.  Objective:    Vitals:   06/19/19 1900 06/19/19 2134 06/20/19 0500 06/20/19 0549  BP: 137/83 (!) 150/74 116/71   Pulse: 82  89   Resp: (!) 24  (!) 26   Temp:  98.9 F (37.2 C) 99.1 F (37.3 C)   TempSrc:  Oral Oral   SpO2: 99%  100%   Weight:    115.7 kg  Height:    6\' 2"  (1.88 m)    Intake/Output Summary (Last 24 hours) at 06/20/2019 0721 Last data filed at 06/20/2019 0647 Gross per 24 hour  Intake 626.59 ml  Output -  Net 626.59 ml   Filed Weights   06/19/19 1102 06/19/19 1124 06/20/19 0549  Weight: 127.9 kg 127.9 kg 115.7 kg    Exam: General exam: In no acute distress. Respiratory system: Good air movement and clear to auscultation. Cardiovascular system: S1 & S2 heard, RRR. No JVD, murmurs. Gastrointestinal system: Abdomen is nondistended, soft and nontender.  Central nervous system: Alert and oriented. No focal neurological deficits. Extremities: No pedal  edema. Skin: No rashes, lesions or ulcers Psychiatry: Judgement and insight appear normal. Mood & affect appropriate.     Data Reviewed:    Labs: Basic Metabolic Panel: Recent Labs  Lab 06/19/19 1126  NA 137  K 4.2  CL 108  CO2 19*  GLUCOSE 153*  BUN 45*  CREATININE 3.00*  CALCIUM 8.9   GFR Estimated Creatinine Clearance: 25.7 mL/min (A) (by C-G formula based on SCr of 3 mg/dL (H)). Liver Function Tests: Recent Labs  Lab 06/19/19 1126  AST 47*  ALT 40  ALKPHOS 72  BILITOT 0.9  PROT 7.9  ALBUMIN 3.4*   No results for input(s): LIPASE, AMYLASE in the last 168 hours. No results for input(s): AMMONIA in the last 168 hours. Coagulation profile No results for input(s): INR, PROTIME in the last 168 hours. COVID-19 Labs  Recent Labs    06/19/19 1200  DDIMER 1.49*  FERRITIN 1,286*  LDH 242*  CRP 6.0*    Lab Results  Component Value Date   SARSCOV2NAA POSITIVE (A) 06/19/2019    CBC: Recent Labs  Lab 06/19/19 1126  WBC 5.0  NEUTROABS 3.8  HGB 15.3  HCT 48.2  MCV 92.5  PLT 162  Cardiac Enzymes: No results for input(s): CKTOTAL, CKMB, CKMBINDEX, TROPONINI in the last 168 hours. BNP (last 3 results) No results for input(s): PROBNP in the last 8760 hours. CBG: Recent Labs  Lab 06/19/19 1136  GLUCAP 145*   D-Dimer: Recent Labs    06/19/19 1200  DDIMER 1.49*   Hgb A1c: No results for input(s): HGBA1C in the last 72 hours. Lipid Profile: Recent Labs    06/19/19 1200  TRIG 150*   Thyroid function studies: Recent Labs    06/19/19 2200  TSH 1.617   Anemia work up: Recent Labs    06/19/19 1200  FERRITIN 1,286*   Sepsis Labs: Recent Labs  Lab 06/19/19 1126 06/19/19 1156 06/19/19 1200  PROCALCITON  --   --  0.43  WBC 5.0  --   --   LATICACIDVEN  --  1.3  --    Microbiology Recent Results (from the past 240 hour(s))  SARS Coronavirus 2 (CEPHEID- Performed in [place] hospital lab), Hosp Order     Status: Abnormal    Collection Time: 06/19/19  1:05 PM   Specimen: Nasopharyngeal Swab  Result Value Ref Range Status   SARS Coronavirus 2 POSITIVE (A) NEGATIVE Final    Comment: RESULT CALLED TO, READ BACK BY AND VERIFIED WITH: AMANDA RN AT 9147 ON 06/19/2019 BY S.VANHOORNE (NOTE) If result is NEGATIVE SARS-CoV-2 target nucleic acids are NOT DETECTED. The SARS-CoV-2 RNA is generally detectable in upper and lower  respiratory specimens during the acute phase of infection. The lowest  concentration of SARS-CoV-2 viral copies this assay can detect is 250  copies / mL. A negative result does not preclude SARS-CoV-2 infection  and should not be used as the sole basis for treatment or other  patient management decisions.  A negative result may occur with  improper specimen collection / handling, submission of specimen other  than nasopharyngeal swab, presence of viral mutation(s) within the  areas targeted by this assay, and inadequate number of viral copies  (<250 copies / mL). A negative result must be combined with clinical  observations, patient history, and epidemiological information. If result is POSITIVE SARS-CoV-2 target nucleic acids are DET ECTED. The SARS-CoV-2 RNA is generally detectable in upper and lower  respiratory specimens during the acute phase of infection.  Positive  results are indicative of active infection with SARS-CoV-2.  Clinical  correlation with patient history and other diagnostic information is  necessary to determine patient infection status.  Positive results do  not rule out bacterial infection or co-infection with other viruses. If result is PRESUMPTIVE POSTIVE SARS-CoV-2 nucleic acids MAY BE PRESENT.   A presumptive positive result was obtained on the submitted specimen  and confirmed on repeat testing.  While 2019 novel coronavirus  (SARS-CoV-2) nucleic acids may be present in the submitted sample  additional confirmatory testing may be necessary for epidemiological  and  / or clinical management purposes  to differentiate between  SARS-CoV-2 and other Sarbecovirus currently known to infect humans.  If clinically indicated additional testing with an alternate test  methodology ([place]) is advised. The SARS-CoV-2 RNA is generally  detectable in upper and lower respiratory specimens during the acute  phase of infection. The expected result is Negative. Fact Sheet for Patients:  StrictlyIdeas.no Fact Sheet for Healthcare Providers: BankingDealers.co.za This test is not yet approved or cleared by the [place] FDA and has been authorized for detection and/or diagnosis of SARS-CoV-2 by FDA under an Emergency Use Authorization (EUA).  This EUA  will remain in effect (meaning this test can be used) for the duration of the COVID-19 declaration under Section 564(b)(1) of the Act, 21 U.S.C. section 360bbb-3(b)(1), unless the authorization is terminated or revoked sooner. Performed at [facility], [place] [address], [place], [place] [address]      Medications:   . atorvastatin  40 mg Oral QHS  . enoxaparin (LOVENOX) injection  40 mg Subcutaneous Q24H  . pantoprazole  40 mg Oral BID  . sodium chloride flush  3 mL Intravenous Q12H   Continuous Infusions: . sodium chloride 75 mL/hr at 06/19/19 2200     LOS: 0 days   Charlynne Cousins  [facility]  06/20/2019, 7:21 AM

## 2019-06-20 NOTE — Evaluation (Signed)
Physical Therapy Evaluation Patient Details Name: Charles Castillo. MRN: [id number] DOB: 12-Sep-1934 Today's Date: 06/20/2019   History of Present Illness  Charles Castillo. is an 85 y.o. male past medical history o BPH, urethral cancer status post ureterectomy, chronic kidney disease, aortic stenosis, obesity osteoarthritis presents with weakness , fall at [facility]  Clinical Impression  The patient  Is weak and requires steady assist to ambulate in room. SaO2 > 92% on RA.  The patient reports pain about the right posterior right hip area, especially when rolling over. The patient was slightly antalgic on right but reports that weight bearing was very little pain elicited. Sitting in recliner also elicited no pain. Continue to monitor Right hip pain with mobility. RN aware of pt. Reports of pain. Pt admitted with above diagnosis. Pt currently with functional limitations due to the deficits listed below (see PT Problem List).   Pt will benefit from skilled PT to increase their independence and safety with mobility to allow discharge to the venue listed below.       Follow Up Recommendations Home health PT(Vs SNF, depends on progress)    Equipment Recommendations  None recommended by PT    Recommendations for Other Services       Precautions / Restrictions Precautions Precautions: Fall Precaution Comments: R hip pain      Mobility  Bed Mobility Overal bed mobility: Needs Assistance Bed Mobility: Rolling;Sidelying to Sit Rolling: Min assist Sidelying to sit: Mod assist       General bed mobility comments: assist with trunk  to sit up  Transfers Overall transfer level: Needs assistance Equipment used: Rolling walker (2 wheeled) Transfers: Sit to/from Stand Sit to Stand: Min assist         General transfer comment: steady assist to rise from raised bed,  Ambulation/Gait Ambulation/Gait assistance: +2 safety/equipment;Mod assist;Min assist Gait Distance (Feet): 20  Feet Assistive device: Rolling walker (2 wheeled) Gait Pattern/deviations: Step-to pattern;Drifts right/left;Staggering right;Staggering left;Decreased stride length Gait velocity: decr   General Gait Details: Both knees flexed during ambualtion. Steady assist for balance at times  Stairs            Wheelchair Mobility    Modified Rankin (Stroke Patients Only)       Balance Overall balance assessment: Needs assistance;History of Falls Sitting-balance support: Feet supported;Bilateral upper extremity supported Sitting balance-Leahy Scale: Fair     Standing balance support: During functional activity;Bilateral upper extremity supported Standing balance-Leahy Scale: Poor Standing balance comment: reliant on UE's                             Pertinent Vitals/Pain Pain Assessment: Faces Pain Score: 8  Faces Pain Scale: Hurts little more Pain Location: " when I roll over onto right hip" Hurts Like hell", Bwaeing weight to amb was <4 Pain Descriptors / Indicators: Discomfort;Grimacing;Guarding Pain Intervention(s): Monitored during session    Home Living Family/patient expects to be discharged to:: Private residence Living Arrangements: Spouse/significant other Available Help at Discharge: Family Type of Home: House Home Access: Stairs to enter Entrance Stairs-Rails: Right Entrance Stairs-Number of Steps: 3 Home Layout: Two level;Able to live on main level with bedroom/bathroom Home Equipment: Shower seat - built in;Grab bars - tub/shower;Cane - single point;Walker - 2 wheels Additional Comments: wife + covid at home    Prior Function Level of Independence: Independent with assistive device(s)         Comments: was driving  Hand Dominance   Dominant Hand: Right    Extremity/Trunk Assessment   Upper Extremity Assessment Upper Extremity Assessment: Generalized weakness    Lower Extremity Assessment Lower Extremity Assessment: Generalized  weakness RLE Deficits / Details: AROM WFL  at the hip    Cervical / Trunk Assessment Cervical / Trunk Assessment: Normal  Communication   Communication: No difficulties  Cognition Arousal/Alertness: Awake/alert Behavior During Therapy: Flat affect;WFL for tasks assessed/performed Overall Cognitive Status: Impaired/Different from baseline Area of Impairment: Orientation                 Orientation Level: Time Current Attention Level: Sustained Memory: Decreased short-term memory   Safety/Judgement: Decreased awareness of safety     General Comments: Appeares minimally confused. Improved throughtou session. Pt asking to get back to bed when he was already lying in bed      General Comments      Exercises     Assessment/Plan    PT Assessment Patient needs continued PT services  PT Problem List Decreased strength;Decreased mobility;Decreased safety awareness;Decreased knowledge of precautions;Decreased activity tolerance;Decreased balance;Decreased knowledge of use of DME       PT Treatment Interventions DME instruction;Therapeutic activities;Gait training;Therapeutic exercise;Patient/family education;Stair training;Functional mobility training    PT Goals (Current goals can be found in the Care Plan section)  Acute Rehab PT Goals Patient Stated Goal: to go home PT Goal Formulation: With patient Time For Goal Achievement: 07/04/19 Potential to Achieve Goals: Good    Frequency Min 3X/week   Barriers to discharge Decreased caregiver support      Co-evaluation PT/OT/SLP Co-Evaluation/Treatment: Yes Reason for Co-Treatment: For patient/therapist safety PT goals addressed during session: Mobility/safety with mobility OT goals addressed during session: ADL's and self-care;Strengthening/ROM       AM-PAC PT "6 Clicks" Mobility  Outcome Measure Help needed turning from your back to your side while in a flat bed without using bedrails?: A Lot Help needed moving  from lying on your back to sitting on the side of a flat bed without using bedrails?: A Lot Help needed moving to and from a bed to a chair (including a wheelchair)?: A Lot Help needed standing up from a chair using your arms (e.g., wheelchair or bedside chair)?: A Lot Help needed to walk in hospital room?: A Lot Help needed climbing 3-5 steps with a railing? : A Lot 6 Click Score: 12    End of Session Equipment Utilized During Treatment: Gait belt Activity Tolerance: Patient limited by fatigue Patient left: in chair;with call bell/phone within reach;with chair alarm set Nurse Communication: Mobility status PT Visit Diagnosis: Unsteadiness on feet (R26.81);History of falling (Z91.81)    Time: 0920-0950 PT Time Calculation (min) (ACUTE ONLY): 30 min   Charges:   PT Evaluation $PT Eval Low Complexity: [facility] PT Acute Rehabilitation Services [phone] Office [phone]   Claretha Cooper 06/20/2019, 2:39 PM

## 2019-06-21 LAB — COMPREHENSIVE METABOLIC PANEL
ALT: 35 U/L (ref 0–44)
AST: 51 U/L — ABNORMAL HIGH (ref 15–41)
Albumin: 3 g/dL — ABNORMAL LOW (ref 3.5–5.0)
Alkaline Phosphatase: 66 U/L (ref 38–126)
Anion gap: 9 (ref 5–15)
BUN: 43 mg/dL — ABNORMAL HIGH (ref 8–23)
CO2: 20 mmol/L — ABNORMAL LOW (ref 22–32)
Calcium: 8.6 mg/dL — ABNORMAL LOW (ref 8.9–10.3)
Chloride: 112 mmol/L — ABNORMAL HIGH (ref 98–111)
Creatinine, Ser: 2.86 mg/dL — ABNORMAL HIGH (ref 0.61–1.24)
GFR calc Af Amer: 23 mL/min — ABNORMAL LOW (ref >60)
GFR calc non Af Amer: 20 mL/min — ABNORMAL LOW (ref >60)
Glucose, Bld: 108 mg/dL — ABNORMAL HIGH (ref 70–99)
Potassium: 4.3 mmol/L (ref 3.5–5.1)
Sodium: 141 mmol/L (ref 135–145)
Total Bilirubin: 0.5 mg/dL (ref 0.3–1.2)
Total Protein: 6.8 g/dL (ref 6.5–8.1)

## 2019-06-21 LAB — C-REACTIVE PROTEIN: CRP: 8.2 mg/dL — ABNORMAL HIGH (ref <1.0)

## 2019-06-21 LAB — GLUCOSE, CAPILLARY
Glucose-Capillary: 118 mg/dL — ABNORMAL HIGH (ref 70–99)
Glucose-Capillary: 113 mg/dL — ABNORMAL HIGH (ref 70–99)
Glucose-Capillary: 121 mg/dL — ABNORMAL HIGH (ref 70–99)

## 2019-06-21 LAB — PHOSPHORUS: Phosphorus: 2.8 mg/dL (ref 2.5–4.6)

## 2019-06-21 LAB — FERRITIN: Ferritin: 1494 ng/mL — ABNORMAL HIGH (ref 24–336)

## 2019-06-21 LAB — D-DIMER, QUANTITATIVE: D-Dimer, Quant: 1.58 ug/mL-FEU — ABNORMAL HIGH (ref 0.00–0.50)

## 2019-06-21 LAB — MAGNESIUM: Magnesium: 2.2 mg/dL (ref 1.7–2.4)

## 2019-06-21 LAB — CK: Total CK: 504 U/L — ABNORMAL HIGH (ref 49–397)

## 2019-06-21 MED ORDER — METHYLPREDNISOLONE SODIUM SUCC 40 MG IJ SOLR
40.0000 mg | Freq: Two times a day (BID) | INTRAMUSCULAR | Status: DC
  Administered 2019-06-21 – 2019-06-22 (×3): 40 mg via INTRAVENOUS
  Filled 2019-06-21 (×3): qty 1

## 2019-06-21 NOTE — Progress Notes (Signed)
[facility] PROGRESS NOTE    Progress Note  Charles Castillo.  XBW:[id number] DOB: 11-20-1934 DOA: 06/19/2019 PCP: Marin Olp, MD     Brief Narrative:   Charles Castillo. is an 85 y.o. 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  Relates some low-grade temps, he decided to go to urgent care and he almost fell trying to get out of the chair there was no loss of consciousness he denied any lightheadedness or dizziness.  Assessment/Plan:   Generalized weakness and deconditioning likely secondary to COVID-19: He has multiple risk factors for decompensation and organ failure including but not limited to being a male history of cancer, hypertension and obesity. Blood cultures are negative till date.  Respiratory panel is pending. Physical therapy and Occupational Therapy evaluated the patient recommended home health PT. His inflammatory markers are trending up he is now febrile, saturating greater than 94% on room air.  Acute kidney injury on CKD (chronic kidney disease), stage IV (HCC) Creatinine back in May 2019 was around 2.3-2.5. Likely hemodynamically mediated.  Acute fall/orthostatic hypotension: Patient denies any loss of consciousness. Orthostatic vitals on admission were positive. CT head showed no acute findings.  Morbid obesity ([place]): Counseling.   Essential hypertension: Antihypertensive medications were held at admission he was started on IV fluids.  Elevated LFTs: Likely due to COVID-19. Continue statins for now.  Type II diabetes mellitus with stage 4 chronic kidney disease ([place]): According to patient is diet controlled, will check CBG fasting this morning and check an A1c  Mild aortic stenosis Moderate  by echocardiogram 2019   DVT prophylaxis: lovenox Family Communication:none Disposition Plan/Barrier to D/C: Unable to determine Code Status:     Code Status Orders  (From admission, onward)         Start     Ordered   06/19/19 2130  Full code  Continuous     06/19/19 2129        Code Status History    Date Active Date Inactive Code Status Order ID Comments User Context   06/19/2019 2129 06/19/2019 2129 Full Code [id number]  Kerney Elbe, DO Inpatient   05/23/2017 1750 05/27/2017 1848 Full Code [id number]  Janece Canterbury, MD Inpatient   Advance Care Planning Activity        IV Access:    Peripheral IV   Procedures and diagnostic studies:   Ct Head Wo Contrast  Result Date: 06/19/2019 CLINICAL DATA:  Neuro deficits.  Subacute head trauma.  Ataxia. EXAM: CT HEAD WITHOUT CONTRAST TECHNIQUE: Contiguous axial images were obtained from the base of the skull through the vertex without intravenous contrast. COMPARISON:  None. FINDINGS: Brain: No evidence of acute infarction, hemorrhage, hydrocephalus, extra-axial collection or mass lesion/mass effect. There are microvascular ischemic changes. There is significant volume loss. Vascular: No hyperdense vessel or unexpected calcification. Skull: Normal. Negative for fracture or focal lesion. Sinuses/Orbits: No acute finding. The patient is status post bilateral cataract surgery. Other: None. IMPRESSION: 1. No acute intracranial abnormality. 2. There is moderate atrophy with chronic microvascular ischemic changes. Electronically Signed   By: Constance Holster M.D.   On: 06/19/2019 20:02   Dg Chest Port 1 View  Result Date: 06/20/2019 CLINICAL DATA:  Shortness of breath EXAM: PORTABLE CHEST 1 VIEW COMPARISON:  Yesterday  FINDINGS: Artifact from EKG leads. Normal heart size and mediastinal contours. Interstitial coarsening which is increased, with possible early nodular airspace density at the right base. IMPRESSION: Increasing interstitial  opacity. Electronically Signed   By: Monte Fantasia M.D.   On: 06/20/2019 05:54   Dg Chest Portable 1 View  Result Date: 06/19/2019 CLINICAL DATA:  Fever EXAM: PORTABLE CHEST 1 VIEW COMPARISON:  November 27, 2018 FINDINGS: There is no evident edema or consolidation. Heart size and pulmonary vascularity are normal. No adenopathy. No bone lesions. IMPRESSION: No edema or consolidation. Electronically Signed   By: Lowella Grip III M.D.   On: 06/19/2019 13:10     Medical Consultants:    None.  Anti-Infectives:   None  Subjective:    Charles Castillo. he has no new complaints he relates his respiration is at baseline.  Objective:    Vitals:   06/20/19 2142 06/20/19 2300 06/21/19 0000 06/21/19 0544  BP: 126/72 (!) 144/76 (!) 143/76 116/63  Pulse: (!) 101 (!) 104 (!) 112 95  Resp: (!) 27 (!) 31 16 (!) 27  Temp: 99.6 F (37.6 C)   99.9 F (37.7 C)  TempSrc: Oral   Oral  SpO2: 96% 96% 95% 95%  Weight:      Height:        Intake/Output Summary (Last 24 hours) at 06/21/2019 0811 Last data filed at 06/20/2019 1319 Gross per 24 hour  Intake 180 ml  Output -  Net 180 ml   Filed Weights   06/19/19 1102 06/19/19 1124 06/20/19 0549  Weight: 127.9 kg 127.9 kg 115.7 kg    Exam: General exam: In no acute distress. Respiratory system: Good air movement and clear to auscultation. Cardiovascular system: S1 & S2 heard, RRR. No JVD. Gastrointestinal system: Abdomen is nondistended, soft and nontender.  Central nervous system: Alert and oriented. No focal neurological deficits. Extremities: No pedal edema. Skin: No rashes, lesions or ulcers Psychiatry: Judgement and insight appear normal. Mood & affect appropriate.    Data Reviewed:    Labs: Basic Metabolic Panel: Recent Labs  Lab 06/19/19 1126 06/20/19 0300 06/21/19 0330  NA 137 140 141  K 4.2 4.1 4.3  CL 108 111 112*  CO2 19* 17* 20*  GLUCOSE 153* 124* 108*  BUN 45* 42* 43*  CREATININE 3.00* 2.93* 2.86*   CALCIUM 8.9 8.7* 8.6*  MG  --  2.1 2.2  PHOS  --  2.8 2.8   GFR Estimated Creatinine Clearance: 26.9 mL/min (A) (by C-G formula based on SCr of 2.86 mg/dL (H)). Liver Function Tests: Recent Labs  Lab 06/19/19 1126 06/20/19 0300 06/21/19 0330  AST 47* 47* 51*  ALT 40 35 35  ALKPHOS 72 64 66  BILITOT 0.9 0.5 0.5  PROT 7.9 7.0 6.8  ALBUMIN 3.4* 3.2* 3.0*   No results for input(s): LIPASE, AMYLASE in the last 168 hours. No results for input(s): AMMONIA in the last 168 hours. Coagulation profile No results for input(s): INR, PROTIME in the last 168 hours. COVID-19 Labs  Recent Labs    06/19/19 1200 06/20/19 0300 06/21/19 0330  DDIMER 1.49* 1.72* 1.58*  FERRITIN 1,286* 1,242*  --   LDH 242*  --   --   CRP 6.0* 5.7* 8.2*    Lab Results  Component Value Date   SARSCOV2NAA POSITIVE (A) 06/19/2019    CBC: Recent Labs  Lab 06/19/19 1126  WBC 5.0  NEUTROABS 3.8  HGB 15.3  HCT 48.2  MCV 92.5  PLT  162   Cardiac Enzymes: Recent Labs  Lab 06/20/19 0300 06/21/19 0330  CKTOTAL 438* 504*   BNP (last 3 results) No results for input(s): PROBNP in the last 8760 hours. CBG: Recent Labs  Lab 06/19/19 1136 06/20/19 0516 06/20/19 1459 06/21/19 0552  GLUCAP 145* 127* 156* 118*   D-Dimer: Recent Labs    06/20/19 0300 06/21/19 0330  DDIMER 1.72* 1.58*   Hgb A1c: No results for input(s): HGBA1C in the last 72 hours. Lipid Profile: Recent Labs    06/19/19 1200  TRIG 150*   Thyroid function studies: Recent Labs    06/19/19 2200  TSH 1.617   Anemia work up: Recent Labs    06/19/19 1200 06/20/19 0300  FERRITIN 1,286* 1,242*   Sepsis Labs: Recent Labs  Lab 06/19/19 1126 06/19/19 1156 06/19/19 1200  PROCALCITON  --   --  0.43  WBC 5.0  --   --   LATICACIDVEN  --  1.3  --    Microbiology Recent Results (from the past 240 hour(s))  Blood culture (routine x 2)     Status: None (Preliminary result)   Collection Time: 06/19/19 11:26 AM    Specimen: BLOOD  Result Value Ref Range Status   Specimen Description   Final    BLOOD LEFT ANTECUBITAL Performed at [facility], [place] [address] [place], [place] [address]    Special Requests   Final    BOTTLES DRAWN AEROBIC AND ANAEROBIC Blood Culture adequate volume Performed at [facility] [address], [place], [place] [address]    Culture   Final    NO GROWTH 2 DAYS Performed at [facility] [address], [place], [place] [address]    Report Status PENDING  Incomplete  Blood culture (routine x 2)     Status: None (Preliminary result)   Collection Time: 06/19/19 11:26 AM   Specimen: BLOOD  Result Value Ref Range Status   Specimen Description   Final    BLOOD RIGHT ANTECUBITAL Performed at [facility] [address], [place], [place] [address]    Special Requests   Final    BOTTLES DRAWN AEROBIC AND ANAEROBIC Blood Culture adequate volume Performed at [facility] [address], [place], [place] [address]    Culture   Final    NO GROWTH 2 DAYS Performed at [facility] [address], [place], [place] [address]    Report Status PENDING  Incomplete  Urine culture     Status: None   Collection Time: 06/19/19 12:02 PM   Specimen: Urine, Random  Result Value Ref Range Status   Specimen Description   Final    URINE, RANDOM Performed at [facility] [address], [place], [place] [address]    Special Requests   Final    NONE Performed at [facility], [place] [address] [place], [place] [address]    Culture   Final    NO GROWTH Performed at [facility], [place] [address], [place],  [address]    Report Status 06/20/2019 FINAL  Final  SARS Coronavirus 2 (CEPHEID- Performed in [facility] hospital lab), Hosp Order     Status: Abnormal   Collection Time: 06/19/19  1:05 PM   Specimen: Nasopharyngeal Swab  Result Value Ref  Range Status   SARS Coronavirus 2 POSITIVE (A) NEGATIVE Final    Comment: RESULT CALLED TO, READ BACK BY AND VERIFIED  WITH: AMANDA RN AT 5790 ON 06/19/2019 BY S.VANHOORNE (NOTE) If result is NEGATIVE SARS-CoV-2 target nucleic acids are NOT DETECTED. The SARS-CoV-2 RNA is generally detectable in upper and lower  respiratory specimens during the acute phase of infection. The lowest  concentration of SARS-CoV-2 viral copies this assay can detect is 250  copies / mL. A negative result does not preclude SARS-CoV-2 infection  and should not be used as the sole basis for treatment or other  patient management decisions.  A negative result may occur with  improper specimen collection / handling, submission of specimen other  than nasopharyngeal swab, presence of viral mutation(s) within the  areas targeted by this assay, and inadequate number of viral copies  (<250 copies / mL). A negative result must be combined with clinical  observations, patient history, and epidemiological information. If result is POSITIVE SARS-CoV-2 target nucleic acids are DET ECTED. The SARS-CoV-2 RNA is generally detectable in upper and lower  respiratory specimens during the acute phase of infection.  Positive  results are indicative of active infection with SARS-CoV-2.  Clinical  correlation with patient history and other diagnostic information is  necessary to determine patient infection status.  Positive results do  not rule out bacterial infection or co-infection with other viruses. If result is PRESUMPTIVE POSTIVE SARS-CoV-2 nucleic acids MAY BE PRESENT.   A presumptive positive result was obtained on the submitted specimen  and confirmed on repeat testing.  While 2019 novel coronavirus  (SARS-CoV-2) nucleic acids may be present in the submitted sample  additional confirmatory testing may be necessary for epidemiological  and / or clinical management purposes  to differentiate between  SARS-CoV-2 and other  Sarbecovirus currently known to infect humans.  If clinically indicated additional testing with an alternate test  methodology ([place]) is advised. The SARS-CoV-2 RNA is generally  detectable in upper and lower respiratory specimens during the acute  phase of infection. The expected result is Negative. Fact Sheet for Patients:  StrictlyIdeas.no Fact Sheet for Healthcare Providers: BankingDealers.co.za This test is not yet approved or cleared by the [place] FDA and has been authorized for detection and/or diagnosis of SARS-CoV-2 by FDA under an Emergency Use Authorization (EUA).  This EUA will remain in effect (meaning this test can be used) for the duration of the COVID-19 declaration under Section 564(b)(1) of the Act, 21 U.S.C. section 360bbb-3(b)(1), unless the authorization is terminated or revoked sooner. Performed at [facility], [place] [address], [place], [place] [address]      Medications:   . atorvastatin  40 mg Oral QHS  . enoxaparin (LOVENOX) injection  40 mg Subcutaneous Q24H  . pantoprazole  40 mg Oral BID  . sodium chloride flush  3 mL Intravenous Q12H   Continuous Infusions:    LOS: 1 day   Charlynne Cousins  [facility]  06/21/2019, 8:11 AM

## 2019-06-21 NOTE — Progress Notes (Addendum)
Pt sleeping.  No s/sx of distress.  CLWR.   0433  Pt sleeping.  No s/sx of distress.  CLWR

## 2019-06-22 ENCOUNTER — Inpatient Hospital Stay (HOSPITAL_COMMUNITY): Payer: Self-pay

## 2019-06-22 ENCOUNTER — Inpatient Hospital Stay (HOSPITAL_COMMUNITY): Payer: Medicare Other

## 2019-06-22 ENCOUNTER — Other Ambulatory Visit: Payer: Self-pay

## 2019-06-22 LAB — C-REACTIVE PROTEIN: CRP: 11.3 mg/dL — ABNORMAL HIGH (ref <1.0)

## 2019-06-22 LAB — GLUCOSE, CAPILLARY
Glucose-Capillary: 189 mg/dL — ABNORMAL HIGH (ref 70–99)
Glucose-Capillary: 183 mg/dL — ABNORMAL HIGH (ref 70–99)
Glucose-Capillary: 187 mg/dL — ABNORMAL HIGH (ref 70–99)

## 2019-06-22 LAB — PHOSPHORUS: Phosphorus: 3.2 mg/dL (ref 2.5–4.6)

## 2019-06-22 LAB — D-DIMER, QUANTITATIVE: D-Dimer, Quant: 1.38 ug/mL-FEU — ABNORMAL HIGH (ref 0.00–0.50)

## 2019-06-22 LAB — COMPREHENSIVE METABOLIC PANEL
ALT: 33 U/L (ref 0–44)
AST: 46 U/L — ABNORMAL HIGH (ref 15–41)
Albumin: 3 g/dL — ABNORMAL LOW (ref 3.5–5.0)
Alkaline Phosphatase: 66 U/L (ref 38–126)
Anion gap: 11 (ref 5–15)
BUN: 48 mg/dL — ABNORMAL HIGH (ref 8–23)
CO2: 17 mmol/L — ABNORMAL LOW (ref 22–32)
Calcium: 8.9 mg/dL (ref 8.9–10.3)
Chloride: 110 mmol/L (ref 98–111)
Creatinine, Ser: 2.8 mg/dL — ABNORMAL HIGH (ref 0.61–1.24)
GFR calc Af Amer: 23 mL/min — ABNORMAL LOW (ref >60)
GFR calc non Af Amer: 20 mL/min — ABNORMAL LOW (ref >60)
Glucose, Bld: 177 mg/dL — ABNORMAL HIGH (ref 70–99)
Potassium: 4.3 mmol/L (ref 3.5–5.1)
Sodium: 138 mmol/L (ref 135–145)
Total Bilirubin: 0.7 mg/dL (ref 0.3–1.2)
Total Protein: 7.3 g/dL (ref 6.5–8.1)

## 2019-06-22 LAB — FERRITIN: Ferritin: 1698 ng/mL — ABNORMAL HIGH (ref 24–336)

## 2019-06-22 LAB — MAGNESIUM: Magnesium: 2.6 mg/dL — ABNORMAL HIGH (ref 1.7–2.4)

## 2019-06-22 LAB — HEMOGLOBIN A1C
Hgb A1c MFr Bld: 6.2 % — ABNORMAL HIGH (ref 4.8–5.6)
Mean Plasma Glucose: 131.24 mg/dL

## 2019-06-22 LAB — CK: Total CK: 373 U/L (ref 49–397)

## 2019-06-22 MED ORDER — INSULIN ASPART 100 UNIT/ML ~~LOC~~ SOLN
3.0000 [IU] | Freq: Three times a day (TID) | SUBCUTANEOUS | Status: DC
  Administered 2019-06-22 – 2019-06-24 (×6): 3 [IU] via SUBCUTANEOUS

## 2019-06-22 MED ORDER — SODIUM CHLORIDE 0.9 % IV SOLN
INTRAVENOUS | Status: AC
  Administered 2019-06-22: 15:00:00 via INTRAVENOUS

## 2019-06-22 MED ORDER — INSULIN ASPART 100 UNIT/ML ~~LOC~~ SOLN
0.0000 [IU] | Freq: Three times a day (TID) | SUBCUTANEOUS | Status: DC
  Administered 2019-06-22: 2 [IU] via SUBCUTANEOUS
  Administered 2019-06-22: 13:00:00 3 [IU] via SUBCUTANEOUS
  Administered 2019-06-23 (×2): 2 [IU] via SUBCUTANEOUS
  Administered 2019-06-23: 1 [IU] via SUBCUTANEOUS

## 2019-06-22 NOTE — Plan of Care (Signed)
Patient lying in bed this morning; no complaints of pain. No other concerns noted at this time. Will continue to monitor.

## 2019-06-22 NOTE — Progress Notes (Signed)
[facility] PROGRESS NOTE    Progress Note  Charles Castillo.  JYN:[id number] DOB: [birth year] DOA: 06/19/2019 PCP: Marin Olp, MD     Brief Narrative:   Charles Castillo. is an 85 y.o. 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  Relates some low-grade temps, he decided to go to urgent care and he almost fell trying to get out of the chair there was no loss of consciousness he denied any lightheadedness or dizziness.  Assessment/Plan:   Generalized weakness and deconditioning likely secondary to COVID-19: He has multiple risk factors for decompensation and organ failure including but not limited to being a male history of cancer, hypertension and obesity. Blood cultures are negative till date.   Physical therapy and Occupational Therapy evaluated related that the patient was feeling dizzy upon standing. His inflammatory markers are elevated, he had a single episode of an elevated temperature of 101.8 on the morning of 06/21/2019, since then has remained afebrile not requiring oxygen supplementation. Continue to monitor fever curve and oxygen requirement.  Acute kidney injury on CKD (chronic kidney disease), stage IV (HCC) Creatinine back in May 2019 was around 2.3-2.5. Likely hemodynamically mediated, now resolved.  Acute fall/orthostatic hypotension: Patient denies any loss of consciousness. He continues to complain of dizziness, will start on IV fluid hydration recheck orthostatic vitals in the morning. CT head showed no acute findings.  Morbid obesity ([place]): Counseling.   Essential hypertension: Antihypertensive medications were held at admission, continue IV fluids.  Elevated LFTs: Likely due to COVID-19. Continue  statins for now.  Type II diabetes mellitus with stage 4 chronic kidney disease ([place]): Blood glucose continues to be elevated likely due to steroids, check an A1c start him on sliding scale insulin sensitive scale.  Mild aortic stenosis Moderate by echocardiogram 2019   DVT prophylaxis: lovenox Family Communication:none Disposition Plan/Barrier to D/C: Unable to determine Code Status:     Code Status Orders  (From admission, onward)         Start     Ordered   06/19/19 2130  Full code  Continuous     06/19/19 2129        Code Status History    Date Active Date Inactive Code Status Order ID Comments User Context   06/19/2019 2129 06/19/2019 2129 Full Code [id number]  Kerney Elbe, DO Inpatient   05/23/2017 1750 05/27/2017 1848 Full Code [id number]  Janece Canterbury, MD Inpatient   Advance Care Planning Activity        IV Access:    Peripheral IV   Procedures and diagnostic studies:   No results found.   Medical Consultants:    None.  Anti-Infectives:   None  Subjective:    Charles Castillo. he relates he still feels dizzy upon standing his breathing is unchanged compared to yesterday.  Objective:    Vitals:   06/22/19 0011 06/22/19 0406 06/22/19 0407 06/22/19 0823  BP:    132/76  Pulse:      Resp:      Temp: 97.7 F (36.5 C) 97.7 F (36.5 C)  97.7 F (36.5 C)  TempSrc: Oral Oral  Oral  SpO2:      Weight:   115.9 kg   Height:  Intake/Output Summary (Last 24 hours) at 06/22/2019 0825 Last data filed at 06/21/2019 1800 Gross per 24 hour  Intake 400 ml  Output -  Net 400 ml   Filed Weights   06/19/19 1124 06/20/19 0549 06/22/19 0407  Weight: 127.9 kg 115.7 kg 115.9 kg    Exam: General exam: In no acute distress. Respiratory system: Good air movement and clear to auscultation. Cardiovascular system: S1 & S2 heard, RRR. No JVD. Gastrointestinal system: Abdomen is nondistended, soft and nontender.  Central nervous system: Alert  and oriented. No focal neurological deficits. Extremities: No pedal edema. Skin: No rashes, lesions or ulcers Psychiatry: Judgement and insight appear normal. Mood & affect appropriate.   Data Reviewed:    Labs: Basic Metabolic Panel: Recent Labs  Lab 06/19/19 1126 06/20/19 0300 06/21/19 0330 06/22/19 0350  NA 137 140 141 138  K 4.2 4.1 4.3 4.3  CL 108 111 112* 110  CO2 19* 17* 20* 17*  GLUCOSE 153* 124* 108* 177*  BUN 45* 42* 43* 48*  CREATININE 3.00* 2.93* 2.86* 2.80*  CALCIUM 8.9 8.7* 8.6* 8.9  MG  --  2.1 2.2 2.6*  PHOS  --  2.8 2.8 3.2   GFR Estimated Creatinine Clearance: 27.5 mL/min (A) (by C-G formula based on SCr of 2.8 mg/dL (H)). Liver Function Tests: Recent Labs  Lab 06/19/19 1126 06/20/19 0300 06/21/19 0330 06/22/19 0350  AST 47* 47* 51* 46*  ALT 40 35 35 33  ALKPHOS 72 64 66 66  BILITOT 0.9 0.5 0.5 0.7  PROT 7.9 7.0 6.8 7.3  ALBUMIN 3.4* 3.2* 3.0* 3.0*   No results for input(s): LIPASE, AMYLASE in the last 168 hours. No results for input(s): AMMONIA in the last 168 hours. Coagulation profile No results for input(s): INR, PROTIME in the last 168 hours. COVID-19 Labs  Recent Labs    06/19/19 1200 06/20/19 0300 06/21/19 0330 06/22/19 0350  DDIMER 1.49* 1.72* 1.58* 1.38*  FERRITIN 1,286* 1,242* 1,494* 1,698*  LDH 242*  --   --   --   CRP 6.0* 5.7* 8.2* 11.3*    Lab Results  Component Value Date   SARSCOV2NAA POSITIVE (A) 06/19/2019    CBC: Recent Labs  Lab 06/19/19 1126  WBC 5.0  NEUTROABS 3.8  HGB 15.3  HCT 48.2  MCV 92.5  PLT 162   Cardiac Enzymes: Recent Labs  Lab 06/20/19 0300 06/21/19 0330 06/22/19 0350  CKTOTAL 438* 504* 373   BNP (last 3 results) No results for input(s): PROBNP in the last 8760 hours. CBG: Recent Labs  Lab 06/21/19 0552 06/21/19 0840 06/21/19 1137 06/22/19 0453 06/22/19 0617  GLUCAP 118* 113* 121* 189* 183*   D-Dimer: Recent Labs    06/21/19 0330 06/22/19 0350  DDIMER 1.58* 1.38*    Hgb A1c: No results for input(s): HGBA1C in the last 72 hours. Lipid Profile: Recent Labs    06/19/19 1200  TRIG 150*   Thyroid function studies: Recent Labs    06/19/19 2200  TSH 1.617   Anemia work up: Recent Labs    06/21/19 0330 06/22/19 0350  FERRITIN 1,494* 1,698*   Sepsis Labs: Recent Labs  Lab 06/19/19 1126 06/19/19 1156 06/19/19 1200  PROCALCITON  --   --  0.43  WBC 5.0  --   --   LATICACIDVEN  --  1.3  --    Microbiology Recent Results (from the past 240 hour(s))  Blood culture (routine x 2)     Status: None (Preliminary result)   Collection  Time: 06/19/19 11:26 AM   Specimen: BLOOD  Result Value Ref Range Status   Specimen Description   Final    BLOOD LEFT ANTECUBITAL Performed at [facility] [address], [place], [place] [address]    Special Requests   Final    BOTTLES DRAWN AEROBIC AND ANAEROBIC Blood Culture adequate volume Performed at [facility] [address], [place], [place] [address]    Culture   Final    NO GROWTH 2 DAYS Performed at [facility] [address], [place], [place] [address]    Report Status PENDING  Incomplete  Blood culture (routine x 2)     Status: None (Preliminary result)   Collection Time: 06/19/19 11:26 AM   Specimen: BLOOD  Result Value Ref Range Status   Specimen Description   Final    BLOOD RIGHT ANTECUBITAL Performed at [facility] [address] [place], [place] [address]    Special Requests   Final    BOTTLES DRAWN AEROBIC AND ANAEROBIC Blood Culture adequate volume Performed at [facility] [address] [place], [place] [address]    Culture   Final    NO GROWTH 2 DAYS Performed at [facility] [address] [place], [place] [address]    Report Status PENDING  Incomplete  Urine culture     Status: None   Collection Time: 06/19/19 12:02 PM   Specimen: Urine, Random  Result Value Ref Range Status    Specimen Description   Final    URINE, RANDOM Performed at [facility] [address] [place], [place] [address]    Special Requests   Final    NONE Performed at [facility], [place] [address] [place], [place] [address]    Culture   Final    NO GROWTH Performed at [facility], [place] [address] [place],  [address]    Report Status 06/20/2019 FINAL  Final  SARS Coronavirus 2 (CEPHEID- Performed in [facility] hospital lab), Hosp Order     Status: Abnormal   Collection Time: 06/19/19  1:05 PM   Specimen: Nasopharyngeal Swab  Result Value Ref Range Status   SARS Coronavirus 2 POSITIVE (A) NEGATIVE Final    Comment: RESULT CALLED TO, READ BACK BY AND VERIFIED WITH: AMANDA RN AT 1519 ON 06/19/2019 BY S.VANHOORNE (NOTE) If result is NEGATIVE SARS-CoV-2 target nucleic acids are NOT DETECTED. The SARS-CoV-2 RNA is generally detectable in upper and lower  respiratory specimens during the acute phase of infection. The lowest  concentration of SARS-CoV-2 viral copies this assay can detect is 250  copies / mL. A negative result does not preclude SARS-CoV-2 infection  and should not be used as the sole basis for treatment or other  patient management decisions.  A negative result may occur with  improper specimen collection / handling, submission of specimen other  than nasopharyngeal swab, presence of viral mutation(s) within the  areas targeted by this assay, and inadequate number of viral copies  (<250 copies / mL). A negative result must be combined with clinical  observations, patient history, and epidemiological information. If result is POSITIVE SARS-CoV-2 target nucleic acids are DET ECTED. The SARS-CoV-2 RNA is generally detectable in upper and lower  respiratory specimens during the acute phase of infection.  Positive  results are indicative of active infection with SARS-CoV-2.  Clinical  correlation with patient history and  other diagnostic information  is  necessary to determine patient infection status.  Positive results do  not rule out bacterial infection or co-infection with other viruses. If result is PRESUMPTIVE POSTIVE SARS-CoV-2 nucleic acids MAY BE PRESENT.   A presumptive positive result was obtained on the submitted specimen  and confirmed on repeat testing.  While 2019 novel coronavirus  (SARS-CoV-2) nucleic acids may be present in the submitted sample  additional confirmatory testing may be necessary for epidemiological  and / or clinical management purposes  to differentiate between  SARS-CoV-2 and other Sarbecovirus currently known to infect humans.  If clinically indicated additional testing with an alternate test  methodology ([place]) is advised. The SARS-CoV-2 RNA is generally  detectable in upper and lower respiratory specimens during the acute  phase of infection. The expected result is Negative. Fact Sheet for Patients:  StrictlyIdeas.no Fact Sheet for Healthcare Providers: BankingDealers.co.za This test is not yet approved or cleared by the [place] FDA and has been authorized for detection and/or diagnosis of SARS-CoV-2 by FDA under an Emergency Use Authorization (EUA).  This EUA will remain in effect (meaning this test can be used) for the duration of the COVID-19 declaration under Section 564(b)(1) of the Act, 21 U.S.C. section 360bbb-3(b)(1), unless the authorization is terminated or revoked sooner. Performed at [facility], [place] [address] [place], [place] [address]      Medications:   . atorvastatin  40 mg Oral QHS  . enoxaparin (LOVENOX) injection  40 mg Subcutaneous Q24H  . methylPREDNISolone (SOLU-MEDROL) injection  40 mg Intravenous Q12H  . pantoprazole  40 mg Oral BID  . sodium chloride flush  3 mL Intravenous Q12H   Continuous Infusions:    LOS: 2 days   Charlynne Cousins  [facility]  06/22/2019, 8:25 AM

## 2019-06-22 NOTE — Progress Notes (Addendum)
Pt resting in bed.  Pt denies pain.  No s/sx of distress noted.  CLWR.  0440  Pt sleeping.  No s/sx of distress.  CLWR.

## 2019-06-22 NOTE — Progress Notes (Addendum)
Physical Therapy Treatment Patient Details Name: Charles Castillo. MRN: [id number] DOB: April 11, 1934 Today's Date: 06/22/2019    History of Present Illness Charles Castillo. is an 85 y.o. male past medical history o BPH, urethral cancer status post ureterectomy, chronic kidney disease, aortic stenosis, obesity osteoarthritis presents with weakness , fall at urgent care    PT Comments    Patient continues to be limited by symptomatic orthostasis when mobilizing OOB. See below for all values. Drops with sit to stand--SBP dropped 35 mmHg with essentially no change in HR and felt light headed. Had pt sit and perform exercises (including counterpressure) and again SBP dropped 24 mmHG with sit to stand. RN informed and will try to contact MD to make him aware. Patient educated he needs to get OOB with assistance for every meal. Remained with feet on floor sitting upright with chair alarm in place.    06/22/19 1146 06/22/19 1204 06/22/19 1211  Orthostatic Lying   BP- Lying 128/74  --   --   Pulse- Lying 81  --   --   Orthostatic Sitting  BP- Sitting 136/72 135/74  --   Pulse- Sitting 93 91  --   Orthostatic Standing at 0 minutes  BP- Standing at 0 minutes 101/72 111/61 124/67  Pulse- Standing at 0 minutes 97 96 (after counter pressure and UE exercises) 95     Follow Up Recommendations  Home health PT (vs SNF); will depend on his tolerance for activity once orthostasis is managed. If this continues to be an issue, he will need SNF as he is currently a high fall risk      Equipment Recommendations  None recommended by PT    Recommendations for Other Services       Precautions / Restrictions Precautions Precautions: Fall Precaution Comments: denies hip pain    Mobility  Bed Mobility Overal bed mobility: Needs Assistance   Rolling: Supervision(with rail) Sidelying to sit: Min guard       General bed mobility comments: needed assist to remove covers from his  feet  Transfers Overall transfer level: Needs assistance Equipment used: Rolling walker (2 wheeled) Transfers: Sit to/from Stand Sit to Stand: Min assist         General transfer comment: vc to safely use RW; steady assist to rise from raised bed,  Ambulation/Gait Ambulation/Gait assistance: Min assist Gait Distance (Feet): 4 Feet Assistive device: Rolling walker (2 wheeled) Gait Pattern/deviations: Step-to pattern;Decreased stride length     General Gait Details: limited distance due to orthostasis, even on second attempt after seated exercises   Stairs             Wheelchair Mobility    Modified Rankin (Stroke Patients Only)       Balance           Standing balance support: Bilateral upper extremity supported;During functional activity Standing balance-Leahy Scale: Poor Standing balance comment: reliant on UE's                            Cognition Arousal/Alertness: Awake/alert Behavior During Therapy: Flat affect Overall Cognitive Status: No family/caregiver present to determine baseline cognitive functioning(often answers "I don't know" "I don't remember")                                        Exercises General Exercises -  Lower Extremity Long Arc Quad: AROM;Both;5 reps;Seated Straight Leg Raises: AROM;Strengthening;Both;10 reps;Supine Hip Flexion/Marching: AROM;Both;10 reps;Seated Low Level/ICU Exercises Hip ABduction/ADduction: AROM;Strengthening;Both;10 reps;Supine    General Comments        Pertinent Vitals/Pain Pain Assessment: No/denies pain    Home Living                      Prior Function            PT Goals (current goals can now be found in the care plan section) Acute Rehab PT Goals Patient Stated Goal: to go home Time For Goal Achievement: 07/04/19 Progress towards PT goals: Not progressing toward goals - comment(orthostasis limiting walking)    Frequency    Min 3X/week       PT Plan Current plan remains appropriate    Co-evaluation              AM-PAC PT "6 Clicks" Mobility   Outcome Measure  Help needed turning from your back to your side while in a flat bed without using bedrails?: A Little Help needed moving from lying on your back to sitting on the side of a flat bed without using bedrails?: A Little Help needed moving to and from a bed to a chair (including a wheelchair)?: A Little Help needed standing up from a chair using your arms (e.g., wheelchair or bedside chair)?: A Little Help needed to walk in hospital room?: A Lot Help needed climbing 3-5 steps with a railing? : Total 6 Click Score: 15    End of Session Equipment Utilized During Treatment: Gait belt Activity Tolerance: Treatment limited secondary to medical complications (Comment)(orthostasis) Patient left: in chair;with call bell/phone within reach;with chair alarm set Nurse Communication: Mobility status;Other (comment)(orthostasis; wants purewick places while in chair) PT Visit Diagnosis: Unsteadiness on feet (R26.81);History of falling (Z91.81)     Time: 7416-3845 PT Time Calculation (min) (ACUTE ONLY): 41 min  Charges:  $Therapeutic Exercise: 8-22 mins $Therapeutic Activity: 23-37 mins                        [employer], PT 06/22/2019, 12:51 PM

## 2019-06-23 ENCOUNTER — Inpatient Hospital Stay (HOSPITAL_COMMUNITY): Payer: Medicare Other

## 2019-06-23 DIAGNOSIS — R55 Syncope and collapse: Secondary | ICD-10-CM

## 2019-06-23 LAB — COMPREHENSIVE METABOLIC PANEL
ALT: 32 U/L (ref 0–44)
AST: 38 U/L (ref 15–41)
Albumin: 2.7 g/dL — ABNORMAL LOW (ref 3.5–5.0)
Alkaline Phosphatase: 67 U/L (ref 38–126)
Anion gap: 6 (ref 5–15)
BUN: 50 mg/dL — ABNORMAL HIGH (ref 8–23)
CO2: 19 mmol/L — ABNORMAL LOW (ref 22–32)
Calcium: 8.6 mg/dL — ABNORMAL LOW (ref 8.9–10.3)
Chloride: 113 mmol/L — ABNORMAL HIGH (ref 98–111)
Creatinine, Ser: 2.42 mg/dL — ABNORMAL HIGH (ref 0.61–1.24)
GFR calc Af Amer: 28 mL/min — ABNORMAL LOW (ref >60)
GFR calc non Af Amer: 24 mL/min — ABNORMAL LOW (ref >60)
Glucose, Bld: 150 mg/dL — ABNORMAL HIGH (ref 70–99)
Potassium: 4.1 mmol/L (ref 3.5–5.1)
Sodium: 138 mmol/L (ref 135–145)
Total Bilirubin: 0.5 mg/dL (ref 0.3–1.2)
Total Protein: 6.6 g/dL (ref 6.5–8.1)

## 2019-06-23 LAB — PHOSPHORUS: Phosphorus: 2.6 mg/dL (ref 2.5–4.6)

## 2019-06-23 LAB — D-DIMER, QUANTITATIVE: D-Dimer, Quant: 1.12 ug/mL-FEU — ABNORMAL HIGH (ref 0.00–0.50)

## 2019-06-23 LAB — C-REACTIVE PROTEIN: CRP: 4 mg/dL — ABNORMAL HIGH (ref <1.0)

## 2019-06-23 LAB — ECHOCARDIOGRAM LIMITED
Height: 74 in
Weight: 3622.6 oz

## 2019-06-23 LAB — GLUCOSE, CAPILLARY
Glucose-Capillary: 171 mg/dL — ABNORMAL HIGH (ref 70–99)
Glucose-Capillary: 113 mg/dL — ABNORMAL HIGH (ref 70–99)

## 2019-06-23 LAB — MAGNESIUM: Magnesium: 2.4 mg/dL (ref 1.7–2.4)

## 2019-06-23 LAB — CK: Total CK: 220 U/L (ref 49–397)

## 2019-06-23 LAB — FERRITIN: Ferritin: 1807 ng/mL — ABNORMAL HIGH (ref 24–336)

## 2019-06-23 NOTE — Progress Notes (Signed)
[facility] PROGRESS NOTE    Progress Note  Charles Castillo.  ZOX:[id number] DOB: 07/27/1934 DOA: 06/19/2019 PCP: Marin Olp, MD     Brief Narrative:   Charles Castillo. is an 85 y.o. 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  Relates some low-grade temps, he decided to go to urgent care and he almost fell trying to get out of the chair there was no loss of consciousness he denied any lightheadedness or dizziness.  Assessment/Plan:   Generalized weakness and deconditioning likely secondary to COVID-19: He has multiple risk factors for decompensation and organ failure including but not limited to being a male history of cancer, hypertension and obesity. Blood cultures are negative till date.   Repeat orthostatics vitals, echo is pending. His inflammatory markers are elevated, he had a single episode of an elevated temperature of 101.8 on the morning of 06/21/2019, since then has remained afebrile not requiring oxygen supplementation. Continue to monitor fever curve and oxygen requirement.  Acute kidney injury on CKD (chronic kidney disease), stage IV (HCC) Creatinine back in May 2019 was around 2.3-2.5. Likely hemodynamically mediated, now resolved.  Acute fall/orthostatic hypotension: Patient denies any loss of consciousness. He continues to complain of dizziness, will start on IV fluid hydration recheck orthostatic vitals in the morning. CT head showed no acute findings.  Morbid obesity ([place]): Counseling.   Essential hypertension: Antihypertensive medications were held at admission, continue IV fluids.  Elevated LFTs: Likely due to COVID-19. Continue statins for now.  Type II diabetes mellitus with stage 4  chronic kidney disease ([place]): Blood glucose continues to be elevated likely due to steroids, check an A1c start him on sliding scale insulin sensitive scale.  Mild aortic stenosis Moderate by echocardiogram 2019   DVT prophylaxis: lovenox Family Communication:none Disposition Plan/Barrier to D/C: Unable to determine Code Status:     Code Status Orders  (From admission, onward)         Start     Ordered   06/19/19 2130  Full code  Continuous     06/19/19 2129        Code Status History    Date Active Date Inactive Code Status Order ID Comments User Context   06/19/2019 2129 06/19/2019 2129 Full Code [id number]  Kerney Elbe, DO Inpatient   05/23/2017 1750 05/27/2017 1848 Full Code [id number]  Janece Canterbury, MD Inpatient   Advance Care Planning Activity        IV Access:    Peripheral IV   Procedures and diagnostic studies:   No results found.   Medical Consultants:    None.  Anti-Infectives:   None  Subjective:    Charles Castillo. cont to be dizzy upon standing  Objective:    Vitals:   06/22/19 1211 06/22/19 2052 06/23/19 0440 06/23/19 0636  BP:   135/72   Pulse:      Resp:      Temp:  97.6 F (36.4 C) 97.6 F (36.4 C)   TempSrc:  Oral Oral   SpO2: 96%     Weight:    102.7 kg  Height:        Intake/Output Summary (Last 24 hours) at 06/23/2019 5784 Last data filed at 06/23/2019 0600 Gross per 24 hour  Intake 614.79  ml  Output 100 ml  Net 514.79 ml   Filed Weights   06/20/19 0549 06/22/19 0407 06/23/19 0636  Weight: 115.7 kg 115.9 kg 102.7 kg    Exam: General exam: In no acute distress. Respiratory system: Good air movement and clear to auscultation. Cardiovascular system: S1 & S2 heard, RRR. No JVD. Gastrointestinal system: Abdomen is nondistended, soft and nontender.  Central nervous system: Alert and oriented. No focal neurological deficits. Extremities: No pedal edema. Skin: No rashes, lesions or ulcers Psychiatry:  Judgement and insight appear normal. Mood & affect appropriate.   Data Reviewed:    Labs: Basic Metabolic Panel: Recent Labs  Lab 06/19/19 1126 06/20/19 0300 06/21/19 0330 06/22/19 0350 06/23/19 0450  NA 137 140 141 138 138  K 4.2 4.1 4.3 4.3 4.1  CL 108 111 112* 110 113*  CO2 19* 17* 20* 17* 19*  GLUCOSE 153* 124* 108* 177* 150*  BUN 45* 42* 43* 48* 50*  CREATININE 3.00* 2.93* 2.86* 2.80* 2.42*  CALCIUM 8.9 8.7* 8.6* 8.9 8.6*  MG  --  2.1 2.2 2.6* 2.4  PHOS  --  2.8 2.8 3.2 2.6   GFR Estimated Creatinine Clearance: 30.1 mL/min (A) (by C-G formula based on SCr of 2.42 mg/dL (H)). Liver Function Tests: Recent Labs  Lab 06/19/19 1126 06/20/19 0300 06/21/19 0330 06/22/19 0350 06/23/19 0450  AST 47* 47* 51* 46* 38  ALT 40 35 35 33 32  ALKPHOS 72 64 66 66 67  BILITOT 0.9 0.5 0.5 0.7 0.5  PROT 7.9 7.0 6.8 7.3 6.6  ALBUMIN 3.4* 3.2* 3.0* 3.0* 2.7*   No results for input(s): LIPASE, AMYLASE in the last 168 hours. No results for input(s): AMMONIA in the last 168 hours. Coagulation profile No results for input(s): INR, PROTIME in the last 168 hours. COVID-19 Labs  Recent Labs    06/21/19 0330 06/22/19 0350 06/23/19 0450  DDIMER 1.58* 1.38* 1.12*  FERRITIN 1,494* 1,698* 1,807*  CRP 8.2* 11.3* 4.0*    Lab Results  Component Value Date   SARSCOV2NAA POSITIVE (A) 06/19/2019    CBC: Recent Labs  Lab 06/19/19 1126  WBC 5.0  NEUTROABS 3.8  HGB 15.3  HCT 48.2  MCV 92.5  PLT 162   Cardiac Enzymes: Recent Labs  Lab 06/20/19 0300 06/21/19 0330 06/22/19 0350 06/23/19 0450  CKTOTAL 438* 504* 373 220   BNP (last 3 results) No results for input(s): PROBNP in the last 8760 hours. CBG: Recent Labs  Lab 06/21/19 0840 06/21/19 1137 06/22/19 0453 06/22/19 0617 06/22/19 1541  GLUCAP 113* 121* 189* 183* 187*   D-Dimer: Recent Labs    06/22/19 0350 06/23/19 0450  DDIMER 1.38* 1.12*   Hgb A1c: Recent Labs    06/22/19 0500  HGBA1C 6.2*   Lipid  Profile: No results for input(s): CHOL, HDL, LDLCALC, TRIG, CHOLHDL, LDLDIRECT in the last 72 hours. Thyroid function studies: No results for input(s): TSH, T4TOTAL, T3FREE, THYROIDAB in the last 72 hours.  Invalid input(s): FREET3 Anemia work up: Recent Labs    06/22/19 0350 06/23/19 0450  FERRITIN 1,698* 1,807*   Sepsis Labs: Recent Labs  Lab 06/19/19 1126 06/19/19 1156 06/19/19 1200  PROCALCITON  --   --  0.43  WBC 5.0  --   --   LATICACIDVEN  --  1.3  --    Microbiology Recent Results (from the past 240 hour(s))  Blood culture (routine x 2)     Status: None (Preliminary result)   Collection Time: 06/19/19 11:26 AM  Specimen: BLOOD  Result Value Ref Range Status   Specimen Description   Final    BLOOD LEFT ANTECUBITAL Performed at [facility] [address], [place], [place] [address]    Special Requests   Final    BOTTLES DRAWN AEROBIC AND ANAEROBIC Blood Culture adequate volume Performed at [facility] [address] [place], [place] [address]    Culture   Final    NO GROWTH 3 DAYS Performed at [facility], [place] [address] [place], [place] [address]    Report Status PENDING  Incomplete  Blood culture (routine x 2)     Status: None (Preliminary result)   Collection Time: 06/19/19 11:26 AM   Specimen: BLOOD  Result Value Ref Range Status   Specimen Description   Final    BLOOD RIGHT ANTECUBITAL Performed at [facility] [address], [place], [place] [address]    Special Requests   Final    BOTTLES DRAWN AEROBIC AND ANAEROBIC Blood Culture adequate volume Performed at [facility] [address], [place], [place] [address]    Culture   Final    NO GROWTH 3 DAYS Performed at [facility], [place] [address] [place], [place] [address]    Report Status PENDING  Incomplete  Urine culture     Status: None   Collection Time: 06/19/19 12:02 PM   Specimen: Urine,  Random  Result Value Ref Range Status   Specimen Description   Final    URINE, RANDOM Performed at [facility] [address] [place], [place] [address]    Special Requests   Final    NONE Performed at [facility], [place] [address] [place], [place] [address]    Culture   Final    NO GROWTH Performed at [facility], [place] [address], [place], [place] [address]    Report Status 06/20/2019 FINAL  Final  SARS Coronavirus 2 (CEPHEID- Performed in [facility] hospital lab), Hosp Order     Status: Abnormal   Collection Time: 06/19/19  1:05 PM   Specimen: Nasopharyngeal Swab  Result Value Ref Range Status   SARS Coronavirus 2 POSITIVE (A) NEGATIVE Final    Comment: RESULT CALLED TO, READ BACK BY AND VERIFIED WITH: AMANDA RN AT 1519 ON 06/19/2019 BY S.VANHOORNE (NOTE) If result is NEGATIVE SARS-CoV-2 target nucleic acids are NOT DETECTED. The SARS-CoV-2 RNA is generally detectable in upper and lower  respiratory specimens during the acute phase of infection. The lowest  concentration of SARS-CoV-2 viral copies this assay can detect is 250  copies / mL. A negative result does not preclude SARS-CoV-2 infection  and should not be used as the sole basis for treatment or other  patient management decisions.  A negative result may occur with  improper specimen collection / handling, submission of specimen other  than nasopharyngeal swab, presence of viral mutation(s) within the  areas targeted by this assay, and inadequate number of viral copies  (<250 copies / mL). A negative result must be combined with clinical  observations, patient history, and epidemiological information. If result is POSITIVE SARS-CoV-2 target nucleic acids are DET ECTED. The SARS-CoV-2 RNA is generally detectable in upper and lower  respiratory specimens during the acute phase of infection.  Positive  results are indicative of active infection with SARS-CoV-2.  Clinical   correlation with patient history and other diagnostic information is  necessary to determine patient  infection status.  Positive results do  not rule out bacterial infection or co-infection with other viruses. If result is PRESUMPTIVE POSTIVE SARS-CoV-2 nucleic acids MAY BE PRESENT.   A presumptive positive result was obtained on the submitted specimen  and confirmed on repeat testing.  While 2019 novel coronavirus  (SARS-CoV-2) nucleic acids may be present in the submitted sample  additional confirmatory testing may be necessary for epidemiological  and / or clinical management purposes  to differentiate between  SARS-CoV-2 and other Sarbecovirus currently known to infect humans.  If clinically indicated additional testing with an alternate test  methodology ([place]) is advised. The SARS-CoV-2 RNA is generally  detectable in upper and lower respiratory specimens during the acute  phase of infection. The expected result is Negative. Fact Sheet for Patients:  StrictlyIdeas.no Fact Sheet for Healthcare Providers: BankingDealers.co.za This test is not yet approved or cleared by the [place] FDA and has been authorized for detection and/or diagnosis of SARS-CoV-2 by FDA under an Emergency Use Authorization (EUA).  This EUA will remain in effect (meaning this test can be used) for the duration of the COVID-19 declaration under Section 564(b)(1) of the Act, 21 U.S.C. section 360bbb-3(b)(1), unless the authorization is terminated or revoked sooner. Performed at [facility], [place] [address] [place], [place] [address]      Medications:   . atorvastatin  40 mg Oral QHS  . enoxaparin (LOVENOX) injection  40 mg Subcutaneous Q24H  . insulin aspart  0-9 Units Subcutaneous TID WC  . insulin aspart  3 Units Subcutaneous TID WC  . pantoprazole  40 mg Oral BID  . sodium chloride flush  3 mL Intravenous Q12H   Continuous  Infusions:   LOS: 3 days   Charlynne Cousins  [facility]  06/23/2019, 8:08 AM

## 2019-06-23 NOTE — Progress Notes (Signed)
  Echocardiogram 2D Echocardiogram has been performed.  Bobbye Charleston 06/23/2019, 9:25 AM

## 2019-06-23 NOTE — Progress Notes (Signed)
Patient a Had a urinary inc episode this morning. Ambulates with assistance to the BR but is very unsteady. IV stopped as per order for 12 hr transfusion.

## 2019-06-24 DIAGNOSIS — I951 Orthostatic hypotension: Secondary | ICD-10-CM

## 2019-06-24 LAB — CULTURE, BLOOD (ROUTINE X 2)
Culture: NO GROWTH
Culture: NO GROWTH
Special Requests: ADEQUATE
Special Requests: ADEQUATE

## 2019-06-24 LAB — GLUCOSE, CAPILLARY
Glucose-Capillary: 121 mg/dL — ABNORMAL HIGH (ref 70–99)
Glucose-Capillary: 211 mg/dL — ABNORMAL HIGH (ref 70–99)
Glucose-Capillary: 168 mg/dL — ABNORMAL HIGH (ref 70–99)
Glucose-Capillary: 161 mg/dL — ABNORMAL HIGH (ref 70–99)
Glucose-Capillary: 122 mg/dL — ABNORMAL HIGH (ref 70–99)
Glucose-Capillary: 120 mg/dL — ABNORMAL HIGH (ref 70–99)

## 2019-06-24 LAB — COMPREHENSIVE METABOLIC PANEL
ALT: 34 U/L (ref 0–44)
AST: 37 U/L (ref 15–41)
Albumin: 2.7 g/dL — ABNORMAL LOW (ref 3.5–5.0)
Alkaline Phosphatase: 64 U/L (ref 38–126)
Anion gap: 7 (ref 5–15)
BUN: 49 mg/dL — ABNORMAL HIGH (ref 8–23)
CO2: 18 mmol/L — ABNORMAL LOW (ref 22–32)
Calcium: 8.6 mg/dL — ABNORMAL LOW (ref 8.9–10.3)
Chloride: 115 mmol/L — ABNORMAL HIGH (ref 98–111)
Creatinine, Ser: 2.19 mg/dL — ABNORMAL HIGH (ref 0.61–1.24)
GFR calc Af Amer: 31 mL/min — ABNORMAL LOW (ref >60)
GFR calc non Af Amer: 27 mL/min — ABNORMAL LOW (ref >60)
Glucose, Bld: 115 mg/dL — ABNORMAL HIGH (ref 70–99)
Potassium: 4.4 mmol/L (ref 3.5–5.1)
Sodium: 140 mmol/L (ref 135–145)
Total Bilirubin: 0.5 mg/dL (ref 0.3–1.2)
Total Protein: 6.4 g/dL — ABNORMAL LOW (ref 6.5–8.1)

## 2019-06-24 LAB — FERRITIN: Ferritin: 1493 ng/mL — ABNORMAL HIGH (ref 24–336)

## 2019-06-24 LAB — CK: Total CK: 130 U/L (ref 49–397)

## 2019-06-24 LAB — MAGNESIUM: Magnesium: 2.3 mg/dL (ref 1.7–2.4)

## 2019-06-24 LAB — D-DIMER, QUANTITATIVE: D-Dimer, Quant: 0.99 ug/mL-FEU — ABNORMAL HIGH (ref 0.00–0.50)

## 2019-06-24 LAB — PHOSPHORUS: Phosphorus: 2.4 mg/dL — ABNORMAL LOW (ref 2.5–4.6)

## 2019-06-24 LAB — C-REACTIVE PROTEIN: CRP: 1.1 mg/dL — ABNORMAL HIGH (ref <1.0)

## 2019-06-24 MED ORDER — PREDNISONE 10 MG PO TABS: ORAL_TABLET | ORAL | 0 refills | Status: DC

## 2019-06-24 NOTE — TOC Transition Note (Signed)
Transition of Care [facility]) - CM/SW Discharge Note   Patient Details  Name: Charles Castillo. MRN: [id number] Date of Birth: 14-Feb-1934  [facility] of [facility]) CM/SW Contact:  Ninfa Meeker, RN Phone Number: [phone] (working remotely) 06/24/2019, 10:24 AM   Clinical Narrative:   85 yr old gentleman admitted and treated for [place]. Patient has improved enough to return home. Case manager contacted patient via phone to discuss [place]. Patient politely declines, doesn't want it. Case manager will request RW be delivered to patient. He will discharge home with wife.           Patient Goals and CMS Choice        Discharge Placement                       Discharge Plan and Services                                     Social Determinants of Health (SDOH) Interventions     Readmission Risk Interventions No flowsheet data found.

## 2019-06-24 NOTE — Progress Notes (Signed)
OT Treatment Note  Pt making progress but unsteady with gait and mobility during ADL. Pt will need direct supervision with ADL and mobility and medication management. Wife informed by nurse over the phone. REcommend HHOT, however, pt declining.     06/24/19 1500  OT Visit Information  Last OT Received On 06/24/19  Assistance Needed +1  PT/OT/SLP Co-Evaluation/Treatment Yes  Reason for Co-Treatment For patient/therapist safety  OT goals addressed during session ADL's and self-care  History of Present Illness Charles Castillo. is an 85 y.o. male past medical history o BPH, urethral cancer status post ureterectomy, chronic kidney disease, aortic stenosis, obesity osteoarthritis presents with weakness , fall at urgent care  Precautions  Precautions Fall  Pain Assessment  Pain Assessment No/denies pain  Cognition  Arousal/Alertness Awake/alert  Behavior During Therapy Flat affect  Overall Cognitive Status No family/caregiver present to determine baseline cognitive functioning  Area of Impairment Safety/judgement;Awareness  Memory Decreased short-term memory  Safety/Judgement Decreased awareness of safety;Decreased awareness of deficits  Awareness Emergent  General Comments Decreased awareness of safety; Pt usually ambulates with a cane but currently requires a RW, Staets he can do "everything for himself", but once his pants dropped to the groudn when standing, pt stood and waited. When therapist asked what he was doing, pt did not appear aware of how to problem solve through the situation. Unsure if pt is at his baseline. Will need S with medication management  Upper Extremity Assessment  Upper Extremity Assessment Generalized weakness  Lower Extremity Assessment  Lower Extremity Assessment Defer to PT evaluation  ADL  Overall ADL's  Needs assistance/impaired  Eating/Feeding Independent  Grooming Set up;Sitting  Upper Body Bathing Set up;Sitting  Lower Body Bathing Moderate  assistance;Sit to/from stand  Upper Body Dressing  Set up;Supervision/safety;Sitting  Lower Body Dressing Minimal assistance;Sit to/from Environmental education officer guard;RW  Toileting- Forensic psychologist Min guard;Sit to/from stand  Functional mobility during ADLs Min guard;Rolling walker;Cueing for safety  General ADL Comments Easily fatigues  Bed Mobility  General bed mobility comments OOB in chair  Balance  Overall balance assessment Needs assistance;History of Falls  Sitting balance-Leahy Scale Good  Standing balance support Bilateral upper extremity supported  Standing balance-Leahy Scale Poor  Standing balance comment reliant on UE's  Transfers  Overall transfer level Needs assistance  Transfers Sit to/from Stand  Sit to Stand Min guard  Exercises  Exercises Other exercises  Other Exercises  Other Exercises reviewed theraband HEP  OT - End of Session  Equipment Utilized During Treatment Gait belt;Rolling walker  Activity Tolerance Patient tolerated treatment well  Patient left in chair;with call bell/phone within reach  Nurse Communication Mobility status;Other (comment) (DC needs)  OT Assessment/Plan  OT Plan Discharge plan remains appropriate  OT Visit Diagnosis Unsteadiness on feet (R26.81);Other abnormalities of gait and mobility (R26.89);Muscle weakness (generalized) (M62.81);History of falling (Z91.81);Other symptoms and signs involving cognitive function;Pain  OT Frequency (ACUTE ONLY) Min 3X/week  Follow Up Recommendations Home health OT;Supervision/Assistance - 24 hour  OT Equipment None recommended by OT  AM-PAC OT "6 Clicks" Daily Activity Outcome Measure (Version 2)  Help from another person eating meals? 4  Help from another person taking care of personal grooming? 3  Help from another person toileting, which includes using toliet, bedpan, or urinal? 3  Help from another person bathing (including washing, rinsing, drying)? 3  Help from another  person to put on and taking off regular upper body clothing? 3  Help from another  person to put on and taking off regular lower body clothing? 3  6 Click Score 19  OT Goal Progression  Progress towards OT goals Progressing toward goals  Acute Rehab OT Goals  Patient Stated Goal to go home  OT Goal Formulation With patient  Time For Goal Achievement 07/04/19  Potential to Achieve Goals Good  ADL Goals  Pt Will Perform Lower Body Bathing with modified independence;sit to/from stand;with adaptive equipment  Pt Will Perform Lower Body Dressing with modified independence;with adaptive equipment  Pt Will Transfer to Toilet with modified independence;ambulating;bedside commode  Pt Will Perform Toileting - Clothing Manipulation and hygiene with modified independence;sit to/from stand  Pt/caregiver will Perform Home Exercise Program Independently;With theraband;With written HEP provided;Increased strength;Both right and left upper extremity  Additional ADL Goal #1 Pt will complete standing ADL task at sink with SpO2 remianing >90 adn DOE < 2/4.  Additional ADL Goal #2 Pt will independently verbalize 3 energy conservation techniques for ADL and IADL tasks  OT Time Calculation  OT Start Time (ACUTE ONLY) 1020  OT Stop Time (ACUTE ONLY) 1043  OT Time Calculation (min) 23 min  OT General Charges  $OT Visit 1 Visit  OT Treatments  $Self Care/Home Management  8-22 mins  Maurie Boettcher, OT/L   Acute OT Clinical Specialist [place] Pager [phone] Office [phone]

## 2019-06-24 NOTE — Discharge Summary (Signed)
Physician Discharge Summary  Charles Castillo. EQA:[id number] DOB: [birth year] DOA: 06/19/2019  PCP: Charles Olp, MD  Admit date: 06/19/2019 Discharge date: 06/24/2019  Admitted From: home Disposition:  Home  Recommendations for Outpatient Follow-up:  1. Follow up with PCP in 1-2 weeks 2. Please obtain BMP/CBC in one week   Home Health:No Equipment/Devices:None  Discharge Condition:Stable CODE STATUS:Full Diet recommendation: Heart Healthy   Brief/Interim Summary: 85 y.o. 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  Relates some low-grade temps, he decided to go to urgent care and he almost fell trying to get out of the chair there was no loss of consciousness.  Discharge Diagnoses:  Active Problems:   CKD (chronic kidney disease), stage IV (HCC)   Morbid obesity (HCC)   Type II diabetes mellitus with stage 4 chronic kidney disease (HCC)   Mild aortic stenosis   Generalized weakness   COVID-19 virus infection Generalized weakness likely secondary to COVID-19 virus infection: He had multiple risk factors for decompensation and organ failure. Blood cultures remain negative till date of repeated 2D echo showed no change in gradient in his aortic valve his inflammatory markers, he was started on steroids but they slowly started to improve. His saturations greater than 95% on room air. Go home on a steroid taper.  Acute kidney injury on chronic kidney disease stage IV: Likely hemodynamically mediated resolved with IV fluid hydration his baseline creatinine ranges anywhere from 2.3-2.5 [place] was 3 improve to baseline with IV fluid hydration.  Fall likely due to orthostatic hypotension: Patient denies any loss of consciousness he was  complaining of dizziness upon standing he will start on IV fluid hydration, 2D echo was done that showed no change in his mean gradient in the aortic valve after several bolus of normal saline his orthostasis resolved. CT scan of the head was done that showed no acute findings.  Morbid obesity: He was counseled.  Essential hypertension: Antihypertensive medications were held on admission he will resume them as an outpatient.  LFTs: Likely due to COVID-19 no changes were made to his statins.  Diabetes mellitus type 2: Hemoglobin A1c was 6.2 his blood glucose was elevated here in house likely due to steroids Relates his diabetes is diet controlled at home.  Moderate her aortic stenosis: Repeated 2D echo showed no change in aortic valve gradient.   Discharge Instructions  Discharge Instructions    Diet - low sodium heart healthy   Complete by: As directed    Increase activity slowly   Complete by: As directed    MyChart COVID-19 home monitoring program   Complete by: Jun 24, 2019    Is the patient willing to use the [place] for home monitoring?: Yes   Temperature monitoring   Complete by: Jun 24, 2019    After how many days would you like to receive a notification of this patient's flowsheet entries?: 1     Allergies as of 06/24/2019   No Known Allergies     Medication List    TAKE these medications   acetaminophen 500 MG tablet Commonly known as: TYLENOL Take 1,000 mg by mouth every 6 (six) hours as needed for mild pain or headache.   amLODipine 5 MG tablet Commonly known as: NORVASC Take 1 tablet (5 mg  total) by mouth daily. What changed: when to take this   atorvastatin 40 MG tablet Commonly known as: LIPITOR Take 1 tablet (40 mg total) by mouth daily. What changed: when to take this   OVER THE COUNTER MEDICATION Take 1 tablet by mouth at bedtime as needed (sleep). [employer] brand sleep aid   pantoprazole 40 MG tablet Commonly known as: PROTONIX Take  1 tablet (40 mg total) by mouth 2 (two) times daily. What changed: when to take this   predniSONE 10 MG tablet Commonly known as: DELTASONE Takes 6 tablets for 1 days, then 5 tablets for 1 days, then 4 tablets for 1 days, then 3 tablets for 1 days, then 2 tabs for 1 days, then 1 tab for 1 days, and then stop.       No Known Allergies  Consultations:  none   Procedures/Studies: Ct Head Wo Contrast  Result Date: 06/19/2019 CLINICAL DATA:  Neuro deficits.  Subacute head trauma.  Ataxia. EXAM: CT HEAD WITHOUT CONTRAST TECHNIQUE: Contiguous axial images were obtained from the base of the skull through the vertex without intravenous contrast. COMPARISON:  None. FINDINGS: Brain: No evidence of acute infarction, hemorrhage, hydrocephalus, extra-axial collection or mass lesion/mass effect. There are microvascular ischemic changes. There is significant volume loss. Vascular: No hyperdense vessel or unexpected calcification. Skull: Normal. Negative for fracture or focal lesion. Sinuses/Orbits: No acute finding. The patient is status post bilateral cataract surgery. Other: None. IMPRESSION: 1. No acute intracranial abnormality. 2. There is moderate atrophy with chronic microvascular ischemic changes. Electronically Signed   By: Constance Holster M.D.   On: 06/19/2019 20:02   Dg Chest Port 1 View  Result Date: 06/20/2019 CLINICAL DATA:  Shortness of breath EXAM: PORTABLE CHEST 1 VIEW COMPARISON:  Yesterday FINDINGS: Artifact from EKG leads. Normal heart size and mediastinal contours. Interstitial coarsening which is increased, with possible early nodular airspace density at the right base. IMPRESSION: Increasing interstitial opacity. Electronically Signed   By: Monte Fantasia M.D.   On: 06/20/2019 05:54   Dg Chest Portable 1 View  Result Date: 06/19/2019 CLINICAL DATA:  Fever EXAM: PORTABLE CHEST 1 VIEW COMPARISON:  November 27, 2018 FINDINGS: There is no evident edema or consolidation. Heart size and  pulmonary vascularity are normal. No adenopathy. No bone lesions. IMPRESSION: No edema or consolidation. Electronically Signed   By: Lowella Grip III M.D.   On: 06/19/2019 13:10    Subjective: Complaints feels great.  Discharge Exam: Vitals:   06/24/19 0400 06/24/19 0745  BP: 130/77 94/73  Pulse: 66 69  Resp: 17 16  Temp: 98.8 F (37.1 C) 97.6 F (36.4 C)  SpO2:  96%   Vitals:   06/23/19 1612 06/23/19 2011 06/24/19 0400 06/24/19 0745  BP:  132/73 130/77 94/73  Pulse:  76 66 69  Resp:  17 17 16   Temp: 97.7 F (36.5 C) 97.8 F (36.6 C) 98.8 F (37.1 C) 97.6 F (36.4 C)  TempSrc: Oral Oral Oral Oral  SpO2:    96%  Weight:      Height:        General: Pt is alert, awake, not in acute distress Cardiovascular: RRR, S1/S2 +, no rubs, no gallops Respiratory: CTA bilaterally, no wheezing, no rhonchi Abdominal: Soft, NT, ND, bowel sounds + Extremities: no edema, no cyanosis    The results of significant diagnostics from this hospitalization (including imaging, microbiology, ancillary and laboratory) are listed below for reference.     Microbiology: Recent Results (from the past  240 hour(s))  Blood culture (routine x 2)     Status: None (Preliminary result)   Collection Time: 06/19/19 11:26 AM   Specimen: BLOOD  Result Value Ref Range Status   Specimen Description   Final    BLOOD LEFT ANTECUBITAL Performed at [facility] [address], [place], [place] [address]    Special Requests   Final    BOTTLES DRAWN AEROBIC AND ANAEROBIC Blood Culture adequate volume Performed at [facility] [address], [place], [place] [address]    Culture   Final    NO GROWTH 4 DAYS Performed at [facility], [place] [address], [place], [place] [address]    Report Status PENDING  Incomplete  Blood culture (routine x 2)     Status: None (Preliminary result)   Collection Time: 06/19/19 11:26 AM   Specimen: BLOOD  Result Value Ref  Range Status   Specimen Description   Final    BLOOD RIGHT ANTECUBITAL Performed at [facility] [address] [place], [place] [address]    Special Requests   Final    BOTTLES DRAWN AEROBIC AND ANAEROBIC Blood Culture adequate volume Performed at [facility] [address], [place], [place] [address]    Culture   Final    NO GROWTH 4 DAYS Performed at [facility], [place] [address], [place], [place] [address]    Report Status PENDING  Incomplete  Urine culture     Status: None   Collection Time: 06/19/19 12:02 PM   Specimen: Urine, Random  Result Value Ref Range Status   Specimen Description   Final    URINE, RANDOM Performed at [facility] [address] [place], [place] [address]    Special Requests   Final    NONE Performed at [facility], [place] [address], [place], [place] [address]    Culture   Final    NO GROWTH Performed at [facility], [place] [address], [place],  [address]    Report Status 06/20/2019 FINAL  Final  SARS Coronavirus 2 (CEPHEID- Performed in [facility] hospital lab), Hosp Order     Status: Abnormal   Collection Time: 06/19/19  1:05 PM   Specimen: Nasopharyngeal Swab  Result Value Ref Range Status   SARS Coronavirus 2 POSITIVE (A) NEGATIVE Final    Comment: RESULT CALLED TO, READ BACK BY AND VERIFIED WITH: AMANDA RN AT 1519 ON 06/19/2019 BY S.VANHOORNE (NOTE) If result is NEGATIVE SARS-CoV-2 target nucleic acids are NOT DETECTED. The SARS-CoV-2 RNA is generally detectable in upper and lower  respiratory specimens during the acute phase of infection. The lowest  concentration of SARS-CoV-2 viral copies this assay can detect is 250  copies / mL. A negative result does not preclude SARS-CoV-2 infection  and should not be used as the sole basis for treatment or other  patient management decisions.  A negative result may occur with  improper specimen  collection / handling, submission of specimen other  than nasopharyngeal swab, presence of viral mutation(s) within the  areas targeted by this assay, and inadequate number of viral copies  (<250 copies / mL). A negative result must be combined with clinical  observations, patient history, and epidemiological information. If result is POSITIVE SARS-CoV-2 target nucleic acids are DET ECTED. The SARS-CoV-2 RNA is generally detectable in upper and lower  respiratory specimens during the acute phase of infection.  Positive  results are indicative of active infection with SARS-CoV-2.  Clinical  correlation with patient history and other diagnostic information is  necessary to determine patient infection status.  Positive results do  not rule out bacterial infection or co-infection with other viruses. If result is PRESUMPTIVE POSTIVE SARS-CoV-2 nucleic acids MAY BE PRESENT.   A presumptive positive result was obtained on the submitted specimen  and confirmed on repeat testing.  While 2019 novel coronavirus  (SARS-CoV-2) nucleic acids may be present in the submitted sample  additional confirmatory testing may be necessary for epidemiological  and / or clinical management purposes  to differentiate between  SARS-CoV-2 and other Sarbecovirus currently known to infect humans.  If clinically indicated additional testing with an alternate test  methodology ([place]) is advised. The SARS-CoV-2 RNA is generally  detectable in upper and lower respiratory specimens during the acute  phase of infection. The expected result is Negative. Fact Sheet for Patients:  StrictlyIdeas.no Fact Sheet for Healthcare Providers: BankingDealers.co.za This test is not yet approved or cleared by the [place] FDA and has been authorized for detection and/or diagnosis of SARS-CoV-2 by FDA under an Emergency Use Authorization (EUA).  This EUA will remain in effect  (meaning this test can be used) for the duration of the COVID-19 declaration under Section 564(b)(1) of the Act, 21 U.S.C. section 360bbb-3(b)(1), unless the authorization is terminated or revoked sooner. Performed at [facility], [place] [address], [place], [place] [address]      Labs: BNP (last 3 results) No results for input(s): BNP in the last 8760 hours. Basic Metabolic Panel: Recent Labs  Lab 06/20/19 0300 06/21/19 0330 06/22/19 0350 06/23/19 0450 06/24/19 0400  NA 140 141 138 138 140  K 4.1 4.3 4.3 4.1 4.4  CL 111 112* 110 113* 115*  CO2 17* 20* 17* 19* 18*  GLUCOSE 124* 108* 177* 150* 115*  BUN 42* 43* 48* 50* 49*  CREATININE 2.93* 2.86* 2.80* 2.42* 2.19*  CALCIUM 8.7* 8.6* 8.9 8.6* 8.6*  MG 2.1 2.2 2.6* 2.4 2.3  PHOS 2.8 2.8 3.2 2.6 2.4*   Liver Function Tests: Recent Labs  Lab 06/20/19 0300 06/21/19 0330 06/22/19 0350 06/23/19 0450 06/24/19 0400  AST 47* 51* 46* 38 37  ALT 35 35 33 32 34  ALKPHOS 64 66 66 67 64  BILITOT 0.5 0.5 0.7 0.5 0.5  PROT 7.0 6.8 7.3 6.6 6.4*  ALBUMIN 3.2* 3.0* 3.0* 2.7* 2.7*   No results for input(s): LIPASE, AMYLASE in the last 168 hours. No results for input(s): AMMONIA in the last 168 hours. CBC: Recent Labs  Lab 06/19/19 1126  WBC 5.0  NEUTROABS 3.8  HGB 15.3  HCT 48.2  MCV 92.5  PLT 162   Cardiac Enzymes: Recent Labs  Lab 06/20/19 0300 06/21/19 0330 06/22/19 0350 06/23/19 0450 06/24/19 0400  CKTOTAL 438* 504* 373 220 130   BNP: Invalid input(s): POCBNP CBG: Recent Labs  Lab 06/23/19 0824 06/23/19 1228 06/23/19 1741 06/23/19 2119 06/24/19 0743  GLUCAP 171* 161* 122* 113* 120*   D-Dimer Recent Labs    06/23/19 0450 06/24/19 0400  DDIMER 1.12* 0.99*   Hgb A1c Recent Labs    06/22/19 0500  HGBA1C 6.2*   Lipid Profile No results for input(s): CHOL, HDL, LDLCALC, TRIG, CHOLHDL, LDLDIRECT in the last 72 hours. Thyroid function studies No results for input(s): TSH,  T4TOTAL, T3FREE, THYROIDAB in the last 72 hours.  Invalid input(s): FREET3 Anemia work up [employer]  06/22/19 0350 06/23/19 0450  FERRITIN 1,698* 1,807*   Urinalysis    Component Value Date/Time   COLORURINE YELLOW 06/19/2019 1500   APPEARANCEUR HAZY (A) 06/19/2019 1500   LABSPEC 1.018 06/19/2019 1500   PHURINE 5.0 06/19/2019 1500   GLUCOSEU NEGATIVE 06/19/2019 1500   GLUCOSEU NEGATIVE 10/22/2018 1315   HGBUR MODERATE (A) 06/19/2019 1500   HGBUR trace-lysed 11/04/2009 1053   BILIRUBINUR NEGATIVE 06/19/2019 1500   BILIRUBINUR neg 10/20/2018 0957   KETONESUR NEGATIVE 06/19/2019 1500   PROTEINUR >=300 (A) 06/19/2019 1500   UROBILINOGEN 0.2 10/22/2018 1315   NITRITE NEGATIVE 06/19/2019 1500   LEUKOCYTESUR NEGATIVE 06/19/2019 1500   Sepsis Labs Invalid input(s): PROCALCITONIN,  WBC,  LACTICIDVEN Microbiology Recent Results (from the past 240 hour(s))  Blood culture (routine x 2)     Status: None (Preliminary result)   Collection Time: 06/19/19 11:26 AM   Specimen: BLOOD  Result Value Ref Range Status   Specimen Description   Final    BLOOD LEFT ANTECUBITAL Performed at [facility], [place] [address], [place], [place] [address]    Special Requests   Final    BOTTLES DRAWN AEROBIC AND ANAEROBIC Blood Culture adequate volume Performed at [facility] [address], [place], [place] [address]    Culture   Final    NO GROWTH 4 DAYS Performed at [facility], [place] [address] [place], [place] [address]    Report Status PENDING  Incomplete  Blood culture (routine x 2)     Status: None (Preliminary result)   Collection Time: 06/19/19 11:26 AM   Specimen: BLOOD  Result Value Ref Range Status   Specimen Description   Final    BLOOD RIGHT ANTECUBITAL Performed at [facility] [address], [place], [place] [address]    Special Requests   Final    BOTTLES DRAWN AEROBIC AND ANAEROBIC Blood Culture adequate  volume Performed at [facility] [address], [place], [place] [address]    Culture   Final    NO GROWTH 4 DAYS Performed at [facility], [place] [address], [place], [place] [address]    Report Status PENDING  Incomplete  Urine culture     Status: None   Collection Time: 06/19/19 12:02 PM   Specimen: Urine, Random  Result Value Ref Range Status   Specimen Description   Final    URINE, RANDOM Performed at [facility] [address] [place], [place] [address]    Special Requests   Final    NONE Performed at [facility], [place] [address], [place], [place] [address]    Culture   Final    NO GROWTH Performed at [facility], [place] [address] [place], [place] [address]    Report Status 06/20/2019 FINAL  Final  SARS Coronavirus 2 (CEPHEID- Performed in [facility] hospital lab), Hosp Order     Status: Abnormal   Collection Time: 06/19/19  1:05 PM   Specimen: Nasopharyngeal Swab  Result Value Ref Range Status   SARS Coronavirus 2 POSITIVE (A) NEGATIVE Final    Comment: RESULT CALLED TO, READ BACK BY AND VERIFIED WITH: AMANDA RN AT 1519 ON 06/19/2019 BY S.VANHOORNE (NOTE) If result is NEGATIVE SARS-CoV-2 target nucleic acids are NOT DETECTED. The SARS-CoV-2 RNA is generally detectable in upper and lower  respiratory specimens during the acute phase of infection. The lowest  concentration of SARS-CoV-2 viral copies this assay  can detect is 250  copies / mL. A negative result does not preclude SARS-CoV-2 infection  and should not be used as the sole basis for treatment or other  patient management decisions.  A negative result may occur with  improper specimen collection / handling, submission of specimen other  than nasopharyngeal swab, presence of viral mutation(s) within the  areas targeted by this assay, and inadequate number of viral copies  (<250 copies / mL). A negative result must be combined with clinical   observations, patient history, and epidemiological information. If result is POSITIVE SARS-CoV-2 target nucleic acids are DET ECTED. The SARS-CoV-2 RNA is generally detectable in upper and lower  respiratory specimens during the acute phase of infection.  Positive  results are indicative of active infection with SARS-CoV-2.  Clinical  correlation with patient history and other diagnostic information is  necessary to determine patient infection status.  Positive results do  not rule out bacterial infection or co-infection with other viruses. If result is PRESUMPTIVE POSTIVE SARS-CoV-2 nucleic acids MAY BE PRESENT.   A presumptive positive result was obtained on the submitted specimen  and confirmed on repeat testing.  While 2019 novel coronavirus  (SARS-CoV-2) nucleic acids may be present in the submitted sample  additional confirmatory testing may be necessary for epidemiological  and / or clinical management purposes  to differentiate between  SARS-CoV-2 and other Sarbecovirus currently known to infect humans.  If clinically indicated additional testing with an alternate test  methodology ([place]) is advised. The SARS-CoV-2 RNA is generally  detectable in upper and lower respiratory specimens during the acute  phase of infection. The expected result is Negative. Fact Sheet for Patients:  StrictlyIdeas.no Fact Sheet for Healthcare Providers: BankingDealers.co.za This test is not yet approved or cleared by the [place] FDA and has been authorized for detection and/or diagnosis of SARS-CoV-2 by FDA under an Emergency Use Authorization (EUA).  This EUA will remain in effect (meaning this test can be used) for the duration of the COVID-19 declaration under Section 564(b)(1) of the Act, 21 U.S.C. section 360bbb-3(b)(1), unless the authorization is terminated or revoked sooner. Performed at [facility], [place]  [address] [place], [place] [address]      Time coordinating discharge: Over 40 minutes  SIGNED:   Charlynne Cousins, MD  [facility] 06/24/2019, 9:31 AM Pager   If 7PM-7AM, please contact night-coverage [email or web address] Password [email or web address]

## 2019-06-24 NOTE — Progress Notes (Signed)
D/c instructions given to pt and wife. Spoke with wife Via phone. Pt down to car in w/c. No issues or further questions at this time.

## 2019-06-24 NOTE — Progress Notes (Signed)
Physical Therapy Treatment Patient Details Name: Charles Castillo. MRN: [id number] DOB: [birth year] Today's Date: 06/24/2019    History of Present Illness Nicholas Trompeter. is an 85 y.o. male past medical history o BPH, urethral cancer status post ureterectomy, chronic kidney disease, aortic stenosis, obesity osteoarthritis presents with weakness , fall at urgent care    PT Comments    The patient reports going home. Encouraged opatient that he should ambulate with wife assist and use RW. Patient somewhat irritable. Patient successfully ambulated x 120' with min assist and Rw.    Follow Up Recommendations  Home health PT     Equipment Recommendations  None recommended by PT    Recommendations for Other Services       Precautions / Restrictions Precautions Precautions: Fall Precaution Comments: denies hip pain    Mobility  Bed Mobility               General bed mobility comments: OOB in chair  Transfers Overall transfer level: Needs assistance   Transfers: Sit to/from Stand Sit to Stand: Min guard            Ambulation/Gait Ambulation/Gait assistance: Min assist Gait Distance (Feet): 120 Feet Assistive device: Rolling walker (2 wheeled) Gait Pattern/deviations: Step-to pattern;Decreased stride length Gait velocity: decr   General Gait Details: ambulated on RA, Sao2 >95%   Stairs             Wheelchair Mobility    Modified Rankin (Stroke Patients Only)       Balance Overall balance assessment: Needs assistance;History of Falls   Sitting balance-Leahy Scale: Good     Standing balance support: Bilateral upper extremity supported Standing balance-Leahy Scale: Poor Standing balance comment: reliant on UE's                            Cognition Arousal/Alertness: Awake/alert Behavior During Therapy: Flat affect Overall Cognitive Status: No family/caregiver present to determine baseline cognitive functioning Area of  Impairment: Safety/judgement;Awareness                   Current Attention Level: Sustained Memory: Decreased short-term memory   Safety/Judgement: Decreased awareness of safety;Decreased awareness of deficits Awareness: Emergent   General Comments: Decreased awareness of safety; Pt usually ambulates with a cane but currently requires a RW, Staets he can do "everything for himself", but once his pants dropped to the groudn when standing, pt stood and waited. When therapist asked what he was doing, pt did not appear aware of how to problem solve through the situation. Unsure if pt is at his baseline. Will need S with medication management      Exercises Other Exercises Other Exercises: reviewed theraband HEP    General Comments        Pertinent Vitals/Pain Pain Assessment: No/denies pain    Home Living                      Prior Function            PT Goals (current goals can now be found in the care plan section) Acute Rehab PT Goals Patient Stated Goal: to go home    Frequency    Min 3X/week      PT Plan Current plan remains appropriate    Co-evaluation   Reason for Co-Treatment: For patient/therapist safety   OT goals addressed during session: ADL's and self-care  AM-PAC PT "6 Clicks" Mobility   Outcome Measure  Help needed turning from your back to your side while in a flat bed without using bedrails?: A Little Help needed moving from lying on your back to sitting on the side of a flat bed without using bedrails?: A Little Help needed moving to and from a bed to a chair (including a wheelchair)?: A Little Help needed standing up from a chair using your arms (e.g., wheelchair or bedside chair)?: A Little Help needed to walk in hospital room?: A Lot Help needed climbing 3-5 steps with a railing? : A Lot 6 Click Score: 16    End of Session Equipment Utilized During Treatment: Gait belt Activity Tolerance: Patient tolerated treatment  well Patient left: in chair;with call bell/phone within reach;with chair alarm set Nurse Communication: Mobility status;Other (comment) PT Visit Diagnosis: Unsteadiness on feet (R26.81);History of falling (Z91.81)     Time: 1020-1040 PT Time Calculation (min) (ACUTE ONLY): 20 min  Charges:  $Gait Training: 8-22 mins                    [place] Pager [phone] Office [phone]    Claretha Cooper 06/24/2019, 3:30 PM

## 2019-06-24 NOTE — Discharge Instructions (Signed)
COVID-19 COVID-19 is a respiratory infection that is caused by a virus called severe acute respiratory syndrome coronavirus 2 (SARS-CoV-2). The disease is also known as coronavirus disease or novel coronavirus. In some people, the virus may not cause any symptoms. In others, it may cause a serious infection. The infection can get worse quickly and can lead to complications, such as:  Pneumonia, or infection of the lungs.  Acute respiratory distress syndrome or ARDS. This is fluid build-up in the lungs.  Acute respiratory failure. This is a condition in which there is not enough oxygen passing from the lungs to the body.  Sepsis or septic shock. This is a serious bodily reaction to an infection.  Blood clotting problems.  Secondary infections due to bacteria or fungus. The virus that causes COVID-19 is contagious. This means that it can spread from person to person through droplets from coughs and sneezes (respiratory secretions). What are the causes? This illness is caused by a virus. You may catch the virus by:  Breathing in droplets from an infected person's cough or sneeze.  Touching something, like a table or a doorknob, that was exposed to the virus (contaminated) and then touching your mouth, nose, or eyes. What increases the risk? Risk for infection You are more likely to be infected with this virus if you:  Live in or travel to an area with a COVID-19 outbreak.  Come in contact with a sick person who recently traveled to an area with a COVID-19 outbreak.  Provide care for or live with a person who is infected with COVID-19. Risk for serious illness You are more likely to become seriously ill from the virus if you:  Are 83 years of age or older.  Have a long-term disease that lowers your body's ability to fight infection (immunocompromised).  Live in a nursing home or long-term care facility.  Have a long-term (chronic) disease such as: ? Chronic lung disease, including  chronic obstructive pulmonary disease or asthma ? Heart disease. ? Diabetes. ? Chronic kidney disease. ? Liver disease.  Are obese. What are the signs or symptoms? Symptoms of this condition can range from mild to severe. Symptoms may appear any time from 2 to 14 days after being exposed to the virus. They include:  A fever.  A cough.  Difficulty breathing.  Chills.  Muscle pains.  A sore throat.  Loss of taste or smell. Some people may also have stomach problems, such as nausea, vomiting, or diarrhea. Other people may not have any symptoms of COVID-19. How is this diagnosed? This condition may be diagnosed based on:  Your signs and symptoms, especially if: ? You live in an area with a COVID-19 outbreak. ? You recently traveled to or from an area where the virus is common. ? You provide care for or live with a person who was diagnosed with COVID-19.  A physical exam.  Lab tests, which may include: ? A nasal swab to take a sample of fluid from your nose. ? A throat swab to take a sample of fluid from your throat. ? A sample of mucus from your lungs (sputum). ? Blood tests.  Imaging tests, which may include, X-rays, CT scan, or ultrasound. How is this treated? At present, there is no medicine to treat COVID-19. Medicines that treat other diseases are being used on a trial basis to see if they are effective against COVID-19. Your health care provider will talk with you about ways to treat your symptoms. For most  people, the infection is mild and can be managed at home with rest, fluids, and over-the-counter medicines. °Treatment for a serious infection usually takes places in a hospital intensive care unit (ICU). It may include one or more of the following treatments. These treatments are given until your symptoms improve. °· Receiving fluids and medicines through an IV. °· Supplemental oxygen. Extra oxygen is given through a tube in the nose, a face mask, or a  hood. °· Positioning you to lie on your stomach (prone position). This makes it easier for oxygen to get into the lungs. °· Continuous positive airway pressure (CPAP) or bi-level positive airway pressure (BPAP) machine. This treatment uses mild air pressure to keep the airways open. A tube that is connected to a motor delivers oxygen to the body. °· Ventilator. This treatment moves air into and out of the lungs by using a tube that is placed in your windpipe. °· Tracheostomy. This is a procedure to create a hole in the neck so that a breathing tube can be inserted. °· Extracorporeal membrane oxygenation (ECMO). This procedure gives the lungs a chance to recover by taking over the functions of the heart and lungs. It supplies oxygen to the body and removes carbon dioxide. °Follow these instructions at home: °Lifestyle °· If you are sick, stay home except to get medical care. Your health care provider will tell you how long to stay home. Call your health care provider before you go for medical care. °· Rest at home as told by your health care provider. °· Do not use any products that contain nicotine or tobacco, such as cigarettes, e-cigarettes, and chewing tobacco. If you need help quitting, ask your health care provider. °· Return to your normal activities as told by your health care provider. Ask your health care provider what activities are safe for you. °General instructions °· Take over-the-counter and prescription medicines only as told by your health care provider. °· Drink enough fluid to keep your urine pale yellow. °· Keep all follow-up visits as told by your health care provider. This is important. °How is this prevented? ° °There is no vaccine to help prevent COVID-19 infection. However, there are steps you can take to protect yourself and others from this virus. °To protect yourself:  °· Do not travel to areas where COVID-19 is a risk. The areas where COVID-19 is reported change often. To identify  high-risk areas and travel restrictions, check the CDC travel website: wwwnc.cdc.gov/travel/notices °· If you live in, or must travel to, an area where COVID-19 is a risk, take precautions to avoid infection. °? Stay away from people who are sick. °? Wash your hands often with soap and water for 20 seconds. If soap and water are not available, use an alcohol-based hand sanitizer. °? Avoid touching your mouth, face, eyes, or nose. °? Avoid going out in public, follow guidance from your state and local health authorities. °? If you must go out in public, wear a cloth face covering or face mask. °? Disinfect objects and surfaces that are frequently touched every day. This may include: °§ Counters and tables. °§ Doorknobs and light switches. °§ Sinks and faucets. °§ Electronics, such as phones, remote controls, keyboards, computers, and tablets. °To protect others: °If you have symptoms of COVID-19, take steps to prevent the virus from spreading to others. °· If you think you have a COVID-19 infection, contact your health care provider right away. Tell your health care team that you think you may   have a COVID-19 infection. °· Stay home. Leave your house only to seek medical care. Do not use public transport. °· Do not travel while you are sick. °· Wash your hands often with soap and water for 20 seconds. If soap and water are not available, use alcohol-based hand sanitizer. °· Stay away from other members of your household. Let healthy household members care for children and pets, if possible. If you have to care for children or pets, wash your hands often and wear a mask. If possible, stay in your own room, separate from others. Use a different bathroom. °· Make sure that all people in your household wash their hands well and often. °· Cough or sneeze into a tissue or your sleeve or elbow. Do not cough or sneeze into your hand or into the air. °· Wear a cloth face covering or face mask. °Where to find more  information °· Centers for Disease Control and Prevention: www.cdc.gov/coronavirus/2019-ncov/index.html °· World Health Organization: www.who.int/health-topics/coronavirus °Contact a health care provider if: °· You live in or have traveled to an area where COVID-19 is a risk and you have symptoms of the infection. °· You have had contact with someone who has COVID-19 and you have symptoms of the infection. °Get help right away if: °· You have trouble breathing. °· You have pain or pressure in your chest. °· You have confusion. °· You have bluish lips and fingernails. °· You have difficulty waking from sleep. °· You have symptoms that get worse. °These symptoms may represent a serious problem that is an emergency. Do not wait to see if the symptoms will go away. Get medical help right away. Call your local emergency services (911 in the [place] Do not drive yourself to the hospital. Let the emergency medical personnel know if you think you have COVID-19. °Summary °· COVID-19 is a respiratory infection that is caused by a virus. It is also known as coronavirus disease or novel coronavirus. It can cause serious infections, such as pneumonia, acute respiratory distress syndrome, acute respiratory failure, or sepsis. °· The virus that causes COVID-19 is contagious. This means that it can spread from person to person through droplets from coughs and sneezes. °· You are more likely to develop a serious illness if you are 65 years of age or older, have a weak immunity, live in a nursing home, or have chronic disease. °· There is no medicine to treat COVID-19. Your health care provider will talk with you about ways to treat your symptoms. °· Take steps to protect yourself and others from infection. Wash your hands often and disinfect objects and surfaces that are frequently touched every day. Stay away from people who are sick and wear a mask if you are sick. °This information is not intended to replace advice given to you by  your health care provider. Make sure you discuss any questions you have with your health care provider. °Document Released: 01/10/2019 Document Revised: 05/02/2019 Document Reviewed: 01/10/2019 °Elsevier Patient Education © 2020 [employer] ° °COVID-19: How to Protect Yourself and Others °Know how it spreads °· There is currently no vaccine to prevent coronavirus disease 2019 (COVID-19). °· The best way to prevent illness is to avoid being exposed to this virus. °· The virus is thought to spread mainly from person-to-person. °? Between people who are in close contact with one another (within about 6 feet). °? Through respiratory droplets produced when an infected person coughs, sneezes or talks. °? These droplets can land in   the mouths or noses of people who are nearby or possibly be inhaled into the lungs. °? Some recent studies have suggested that COVID-19 may be spread by people who are not showing symptoms. °Everyone should °Clean your hands often °· Wash your hands often with soap and water for at least 20 seconds especially after you have been in a public place, or after blowing your nose, coughing, or sneezing. °· If soap and water are not readily available, use a hand sanitizer that contains at least 60% alcohol. Cover all surfaces of your hands and rub them together until they feel dry. °· Avoid touching your eyes, nose, and mouth with unwashed hands. °Avoid close contact °· Stay home if you are sick. °· Avoid close contact with people who are sick. °· Put distance between yourself and other people. °? Remember that some people without symptoms may be able to spread virus. °? This is especially important for people who are at higher risk of getting very sick.www.cdc.gov/coronavirus/2019-ncov/need-extra-precautions/people-at-higher-risk.html °Cover your mouth and nose with a cloth face cover when around others °· You could spread COVID-19 to others even if you do not feel sick. °· Everyone should wear a  cloth face cover when they have to go out in public, for example to the grocery store or to pick up other necessities. °? Cloth face coverings should not be placed on young children under age 2, anyone who has trouble breathing, or is unconscious, incapacitated or otherwise unable to remove the mask without assistance. °· The cloth face cover is meant to protect other people in case you are infected. °· Do NOT use a facemask meant for a healthcare worker. °· Continue to keep about 6 feet between yourself and others. The cloth face cover is not a substitute for social distancing. °Cover coughs and sneezes °· If you are in a private setting and do not have on your cloth face covering, remember to always cover your mouth and nose with a tissue when you cough or sneeze or use the inside of your elbow. °· Throw used tissues in the trash. °· Immediately wash your hands with soap and water for at least 20 seconds. If soap and water are not readily available, clean your hands with a hand sanitizer that contains at least 60% alcohol. °Clean and disinfect °· Clean AND disinfect frequently touched surfaces daily. This includes tables, doorknobs, light switches, countertops, handles, desks, phones, keyboards, toilets, faucets, and sinks. www.cdc.gov/coronavirus/2019-ncov/prevent-getting-sick/disinfecting-your-home.html °· If surfaces are dirty, clean them: Use detergent or soap and water prior to disinfection. °· Then, use a household disinfectant. You can see a list of EPA-registered household disinfectants here. °cdc.gov/coronavirus °04/23/2019 °This information is not intended to replace advice given to you by your health care provider. Make sure you discuss any questions you have with your health care provider. °Document Released: 04/02/2019 Document Revised: 05/01/2019 Document Reviewed: 04/02/2019 °Elsevier Patient Education © 2020 [employer] ° ° °COVID-19 Frequently Asked Questions °COVID-19 (coronavirus disease) is  an infection that is caused by a large family of viruses. Some viruses cause illness in people and others cause illness in animals like camels, cats, and bats. In some cases, the viruses that cause illness in animals can spread to humans. °Where did the coronavirus come from? °In December 2019, [place] told the World Health Organization (WHO) of several cases of lung disease (human respiratory illness). These cases were linked to an open seafood and livestock market in the city of [place]. The link to the seafood and   livestock market suggests that the virus may have spread from animals to humans. However, since that first outbreak in December, the virus has also been shown to spread from person to person. °What is the name of the disease and the virus? °Disease name °Early on, this disease was called novel coronavirus. This is because scientists determined that the disease was caused by a new (novel) respiratory virus. The World Health Organization (WHO) has now named the disease COVID-19, or coronavirus disease. °Virus name °The virus that causes the disease is called severe acute respiratory syndrome coronavirus 2 (SARS-CoV-2). °More information on disease and virus naming °World Health Organization (WHO): www.who.int/emergencies/diseases/novel-coronavirus-2019/technical-guidance/naming-the-coronavirus-disease-(covid-2019)-and-the-virus-that-causes-it °Who is at risk for complications from coronavirus disease? °Some people may be at higher risk for complications from coronavirus disease. This includes older adults and people who have chronic diseases, such as heart disease, diabetes, and lung disease. °If you are at higher risk for complications, take these extra precautions: °· Avoid close contact with people who are sick or have a fever or cough. Stay at least 3-6 ft (1-2 m) away from them, if possible. °· Wash your hands often with soap and water for at least 20 seconds. °· Avoid touching your face, mouth, nose, or  eyes. °· Keep supplies on hand at home, such as food, medicine, and cleaning supplies. °· Stay home as much as possible. °· Avoid social gatherings and travel. °How does coronavirus disease spread? °The virus that causes coronavirus disease spreads easily from person to person (is contagious). There are also cases of community-spread disease. This means the disease has spread to: °· People who have no known contact with other infected people. °· People who have not traveled to areas where there are known cases. °It appears to spread from one person to another through droplets from coughing or sneezing. °Can I get the virus from touching surfaces or objects? °There is still a lot that we do not know about the virus that causes coronavirus disease. Scientists are basing a lot of information on what they know about similar viruses, such as: °· Viruses cannot generally survive on surfaces for long. They need a human body (host) to survive. °· It is more likely that the virus is spread by close contact with people who are sick (direct contact), such as through: °? Shaking hands or hugging. °? Breathing in respiratory droplets that travel through the air. This can happen when an infected person coughs or sneezes on or near other people. °· It is less likely that the virus is spread when a person touches a surface or object that has the virus on it (indirect contact). The virus may be able to enter the body if the person touches a surface or object and then touches his or her face, eyes, nose, or mouth. °Can a person spread the virus without having symptoms of the disease? °It may be possible for the virus to spread before a person has symptoms of the disease, but this is most likely not the main way the virus is spreading. It is more likely for the virus to spread by being in close contact with people who are sick and breathing in the respiratory droplets of a sick person's cough or sneeze. °What are the symptoms of  coronavirus disease? °Symptoms vary from person to person and can range from mild to severe. Symptoms may include: °· Fever. °· Cough. °· Tiredness, weakness, or fatigue. °· Fast breathing or feeling short of breath. °These symptoms can appear anywhere from   2 to 14 days after you have been exposed to the virus. If you develop symptoms, call your health care provider. People with severe symptoms may need hospital care. °If I am exposed to the virus, how long does it take before symptoms start? °Symptoms of coronavirus disease may appear anywhere from 2 to 14 days after a person has been exposed to the virus. If you develop symptoms, call your health care provider. °Should I be tested for this virus? °Your health care provider will decide whether to test you based on your symptoms, history of exposure, and your risk factors. °How does a health care provider test for this virus? °Health care providers will collect samples to send for testing. Samples may include: °· Taking a swab of fluid from the nose. °· Taking fluid from the lungs by having you cough up mucus (sputum) into a sterile cup. °· Taking a blood sample. °· Taking a stool or urine sample. °Is there a treatment or vaccine for this virus? °Currently, there is no vaccine to prevent coronavirus disease. Also, there are no medicines like antibiotics or antivirals to treat the virus. A person who becomes sick is given supportive care, which means rest and fluids. A person may also relieve his or her symptoms by using over-the-counter medicines that treat sneezing, coughing, and runny nose. These are the same medicines that a person takes for the common cold. °If you develop symptoms, call your health care provider. People with severe symptoms may need hospital care. °What can I do to protect myself and my family from this virus? ° °  ° °You can protect yourself and your family by taking the same actions that you would take to prevent the spread of other viruses.  Take the following actions: °· Wash your hands often with soap and water for at least 20 seconds. If soap and water are not available, use alcohol-based hand sanitizer. °· Avoid touching your face, mouth, nose, or eyes. °· Cough or sneeze into a tissue, sleeve, or elbow. Do not cough or sneeze into your hand or the air. °? If you cough or sneeze into a tissue, throw it away immediately and wash your hands. °· Disinfect objects and surfaces that you frequently touch every day. °· Avoid close contact with people who are sick or have a fever or cough. Stay at least 3-6 ft (1-2 m) away from them, if possible. °· Stay home if you are sick, except to get medical care. Call your health care provider before you get medical care. °· Make sure your vaccines are up to date. Ask your health care provider what vaccines you need. °What should I do if I need to travel? °Follow travel recommendations from your local health authority, the CDC, and WHO. °Travel information and advice °· Centers for Disease Control and Prevention (CDC): www.cdc.gov/coronavirus/2019-ncov/travelers/index.html °· World Health Organization (WHO): www.who.int/emergencies/diseases/novel-coronavirus-2019/travel-advice °Know the risks and take action to protect your health °· You are at higher risk of getting coronavirus disease if you are traveling to areas with an outbreak or if you are exposed to travelers from areas with an outbreak. °· Wash your hands often and practice good hygiene to lower the risk of catching or spreading the virus. °What should I do if I am sick? °General instructions to stop the spread of infection °· Wash your hands often with soap and water for at least 20 seconds. If soap and water are not available, use alcohol-based hand sanitizer. °· Cough or sneeze into a   tissue, sleeve, or elbow. Do not cough or sneeze into your hand or the air.  If you cough or sneeze into a tissue, throw it away immediately and wash your hands.  Stay  home unless you must get medical care. Call your health care provider or local health authority before you get medical care.  Avoid public areas. Do not take public transportation, if possible.  If you can, wear a mask if you must go out of the house or if you are in close contact with someone who is not sick. Keep your home clean  Disinfect objects and surfaces that are frequently touched every day. This may include: ? Counters and tables. ? Doorknobs and light switches. ? Sinks and faucets. ? Electronics such as phones, remote controls, keyboards, computers, and tablets.  Wash dishes in hot, soapy water or use a dishwasher. Air-dry your dishes.  Wash laundry in hot water. Prevent infecting other household members  Let healthy household members care for children and pets, if possible. If you have to care for children or pets, wash your hands often and wear a mask.  Sleep in a different bedroom or bed, if possible.  Do not share personal items, such as razors, toothbrushes, deodorant, combs, brushes, towels, and washcloths. Where to find more information Centers for Disease Control and Prevention (CDC)  Information and news updates: https://www.butler-gonzalez.com/ World Health Organization [facility])  Information and news updates: MissExecutive.com.ee  Coronavirus health topic: https://www.castaneda.info/  Questions and answers on COVID-19: OpportunityDebt.at  Global tracker: who.sprinklr.com American Academy of Pediatrics (AAP)  Information for families: www.healthychildren.org/English/health-issues/conditions/chest-lungs/Pages/2019-Novel-Coronavirus.aspx The coronavirus situation is changing rapidly. Check your local health authority website or the CDC and [facility] websites for updates and news. When should I contact a health care provider?  Contact your health care provider if you have symptoms of an  infection, such as fever or cough, and you: ? Have been near anyone who is known to have coronavirus disease. ? Have come into contact with a person who is suspected to have coronavirus disease. ? Have traveled outside of the country. When should I get emergency medical care?  Get help right away by calling your local emergency services (911 in the [place] if you have: ? Trouble breathing. ? Pain or pressure in your chest. ? Confusion. ? Blue-tinged lips and fingernails. ? Difficulty waking from sleep. ? Symptoms that get worse. Let the emergency medical personnel know if you think you have coronavirus disease. Summary  A new respiratory virus is spreading from person to person and causing COVID-19 (coronavirus disease).  The virus that causes COVID-19 appears to spread easily. It spreads from one person to another through droplets from coughing or sneezing.  Older adults and those with chronic diseases are at higher risk of disease. If you are at higher risk for complications, take extra precautions.  There is currently no vaccine to prevent coronavirus disease. There are no medicines, such as antibiotics or antivirals, to treat the virus.  You can protect yourself and your family by washing your hands often, avoiding touching your face, and covering your coughs and sneezes. This information is not intended to replace advice given to you by your health care provider. Make sure you discuss any questions you have with your health care provider. Document Released: 04/02/2019 Document Revised: 04/02/2019 Document Reviewed: 04/02/2019 Elsevier Patient Education  [place] if You Are Sick If you are sick with COVID-19 or think you might have COVID-19, follow  the steps below to help protect other people in your home and community. Stay home except to get medical care.  Stay home. Most people with COVID-19 have mild illness and are able to recover at  home without medical care. Do not leave your home, except to get medical care. Do not visit public areas.  Take care of yourself. Get rest and stay hydrated.  Get medical care when needed. Call your doctor before you go to their office for care. But, if you have trouble breathing or other concerning symptoms, call 911 for immediate help.  Avoid public transportation, ride-sharing, or taxis. Separate yourself from other people and pets in your home.  As much as possible, stay in a specific room and away from other people and pets in your home. Also, you should use a separate bathroom, if available. If you need to be around other people or animals in or outside of the home, wear a cloth face covering. ? See COVID-19 and Animals if you have questions about pets: [email or web address]/ Monitor your symptoms.  Common symptoms of COVID-19 include fever and cough. Trouble breathing is a more serious symptom that means you should get medical attention.  Follow care instructions from your healthcare provider and local health department. Your local health authorities will give instructions on checking your symptoms and reporting information. If you develop emergency warning signs for COVID-19 get medical attention immediately.  Emergency warning signs include*:  Trouble breathing  Persistent pain or pressure in the chest  New confusion or not able to be woken  Bluish lips or face *This list is not all inclusive. Please consult your medical provider for any other symptoms that are severe or concerning to you. Call 911 if you have a medical emergency. If you have a medical emergency and need to call 911, notify the operator that you have or think you might have, COVID-19. If possible, put on a facemask before medical help arrives. Call ahead before visiting your doctor.  Call ahead. Many medical visits for routine care are being postponed or done by phone  or telemedicine.  If you have a medical appointment that cannot be postponed, call your doctor's office. This will help the office protect themselves and other patients. If you are sick, wear a cloth covering over your nose and mouth.  You should wear a cloth face covering over your nose and mouth if you must be around other people or animals, including pets (even at home).  You don't need to wear the cloth face covering if you are alone. If you can't put on a cloth face covering (because of trouble breathing for example), cover your coughs and sneezes in some other way. Try to stay at least 6 feet away from other people. This will help protect the people around you. Note: During the COVID-19 pandemic, medical grade facemasks are reserved for healthcare workers and some first responders. You may need to make a cloth face covering using a scarf or bandana. Cover your coughs and sneezes.  Cover your mouth and nose with a tissue when you cough or sneeze.  Throw used tissues in a lined trash can.  Immediately wash your hands with soap and water for at least 20 seconds. If soap and water are not available, clean your hands with an alcohol-based hand sanitizer that contains at least 60% alcohol. Clean your hands often.  Wash your hands often with soap and water for at least 20 seconds. This is especially important after blowing your  nose, coughing, or sneezing; going to the bathroom; and before eating or preparing food.  Use hand sanitizer if soap and water are not available. Use an alcohol-based hand sanitizer with at least 60% alcohol, covering all surfaces of your hands and rubbing them together until they feel dry.  Soap and water are the best option, especially if your hands are visibly dirty.  Avoid touching your eyes, nose, and mouth with unwashed hands. Avoid sharing personal household items.  Do not share dishes, drinking glasses, cups, eating utensils, towels, or bedding with other  people in your home.  Wash these items thoroughly after using them with soap and water or put them in the dishwasher. Clean all "high-touch" surfaces everyday.  Clean and disinfect high-touch surfaces in your "sick room" and bathroom. Let someone else clean and disinfect surfaces in common areas, but not your bedroom and bathroom.  If a caregiver or other person needs to clean and disinfect a sick person's bedroom or bathroom, they should do so on an as-needed basis. The caregiver/other person should wear a mask and wait as long as possible after the sick person has used the bathroom. High-touch surfaces include phones, remote controls, counters, tabletops, doorknobs, bathroom fixtures, toilets, keyboards, tablets, and bedside tables.  Clean and disinfect areas that may have blood, stool, or body fluids on them.  Use household cleaners and disinfectants. Clean the area or item with soap and water or another detergent if it is dirty. Then use a household disinfectant. ? Be sure to follow the instructions on the label to ensure safe and effective use of the product. Many products recommend keeping the surface wet for several minutes to ensure germs are killed. Many also recommend precautions such as wearing gloves and making sure you have good ventilation during use of the product. ? Most EPA-registered household disinfectants should be effective. How to discontinue home isolation  People with COVID-19 who have stayed home (home isolated) can stop home isolation under the following conditions: ? If you will not have a test to determine if you are still contagious, you can leave home after these three things have happened:  You have had no fever for at least 72 hours (that is three full days of no fever without the use of medicine that reduces fevers) AND  other symptoms have improved (for example, when your cough or shortness of breath has improved) AND  at least 10 days have passed since your  symptoms first appeared. ? If you will be tested to determine if you are still contagious, you can leave home after these three things have happened:  You no longer have a fever (without the use of medicine that reduces fevers) AND  other symptoms have improved (for example, when your cough or shortness of breath has improved) AND  you received two negative tests in a row, 24 hours apart. Your doctor will follow CDC guidelines. In all cases, follow the guidance of your healthcare provider and local health department. The decision to stop home isolation should be made in consultation with your healthcare provider and state and local health departments. Local decisions depend on local circumstances. [email or web address] 04/21/2019 This information is not intended to replace advice given to you by your health care provider. Make sure you discuss any questions you have with your health care provider. Document Released: 04/02/2019 Document Revised: 05/01/2019 Document Reviewed: 04/02/2019 Elsevier Patient Education  [place].

## 2019-06-25 ENCOUNTER — Telehealth: Payer: Self-pay

## 2019-06-25 NOTE — Telephone Encounter (Signed)
LM for patient to return call for TCM. 

## 2019-06-26 NOTE — Telephone Encounter (Signed)
  PER CHART REVIEW ___________________________________  PCP: Marin Olp, MD  Admit date: 06/19/2019 Discharge date: 06/24/2019  Admitted From: home Disposition:  Home  Recommendations for Outpatient Follow-up:  1. Follow up with PCP in 1-2 weeks 2. Please obtain BMP/CBC in one week   PER TELEPHONE CALL  Transition Care Management Follow-up Telephone Call   Date discharged? 06/24/19   How have you been since you were released from the hospital? Patient has been doing well.  Do you understand why you were in the hospital? yes   Do you understand the discharge instructions? yes   Where were you discharged to? Home    Items Reviewed:  Medications reviewed: yes  Allergies reviewed: yes  Dietary changes reviewed: yes  Referrals reviewed: yes   Functional Questionnaire:   Activities of Daily Living (ADLs):   He states they are independent in the following: ambulation, bathing and hygiene, feeding, continence, grooming, toileting and dressing States they require assistance with the following: N/A   Any transportation issues/concerns?: no   Any patient concerns? no   Confirmed importance and date/time of follow-up visits scheduled yes  Provider Appointment booked with Dr.Hunter 07/05/19 @ 4:00 PM  Confirmed with patient if condition begins to worsen call PCP or go to the ER.  Patient was given the office number and encouraged to call back with question or concerns.  : yes

## 2019-06-26 NOTE — Telephone Encounter (Signed)
Noted thanks °

## 2019-07-05 ENCOUNTER — Inpatient Hospital Stay: Payer: Medicare Other | Admitting: Family Medicine

## 2019-07-12 DIAGNOSIS — N189 Chronic kidney disease, unspecified: Secondary | ICD-10-CM | POA: Diagnosis not present

## 2019-07-12 DIAGNOSIS — N2581 Secondary hyperparathyroidism of renal origin: Secondary | ICD-10-CM | POA: Diagnosis not present

## 2019-07-12 DIAGNOSIS — N184 Chronic kidney disease, stage 4 (severe): Secondary | ICD-10-CM | POA: Diagnosis not present

## 2019-07-12 DIAGNOSIS — I129 Hypertensive chronic kidney disease with stage 1 through stage 4 chronic kidney disease, or unspecified chronic kidney disease: Secondary | ICD-10-CM | POA: Diagnosis not present

## 2019-07-12 DIAGNOSIS — D631 Anemia in chronic kidney disease: Secondary | ICD-10-CM | POA: Diagnosis not present

## 2019-07-22 DIAGNOSIS — Z85828 Personal history of other malignant neoplasm of skin: Secondary | ICD-10-CM | POA: Diagnosis not present

## 2019-07-22 DIAGNOSIS — D1801 Hemangioma of skin and subcutaneous tissue: Secondary | ICD-10-CM | POA: Diagnosis not present

## 2019-07-22 DIAGNOSIS — L309 Dermatitis, unspecified: Secondary | ICD-10-CM | POA: Diagnosis not present

## 2019-07-22 DIAGNOSIS — L821 Other seborrheic keratosis: Secondary | ICD-10-CM | POA: Diagnosis not present

## 2019-07-22 DIAGNOSIS — L918 Other hypertrophic disorders of the skin: Secondary | ICD-10-CM | POA: Diagnosis not present

## 2019-08-11 IMAGING — DX PORTABLE CHEST - 1 VIEW
1 series · 1 of 1 positions shown · non-contrast
Comparison: November 27, 2018

CLINICAL DATA: Fever

EXAM:
PORTABLE CHEST 1 VIEW

[chest ap]
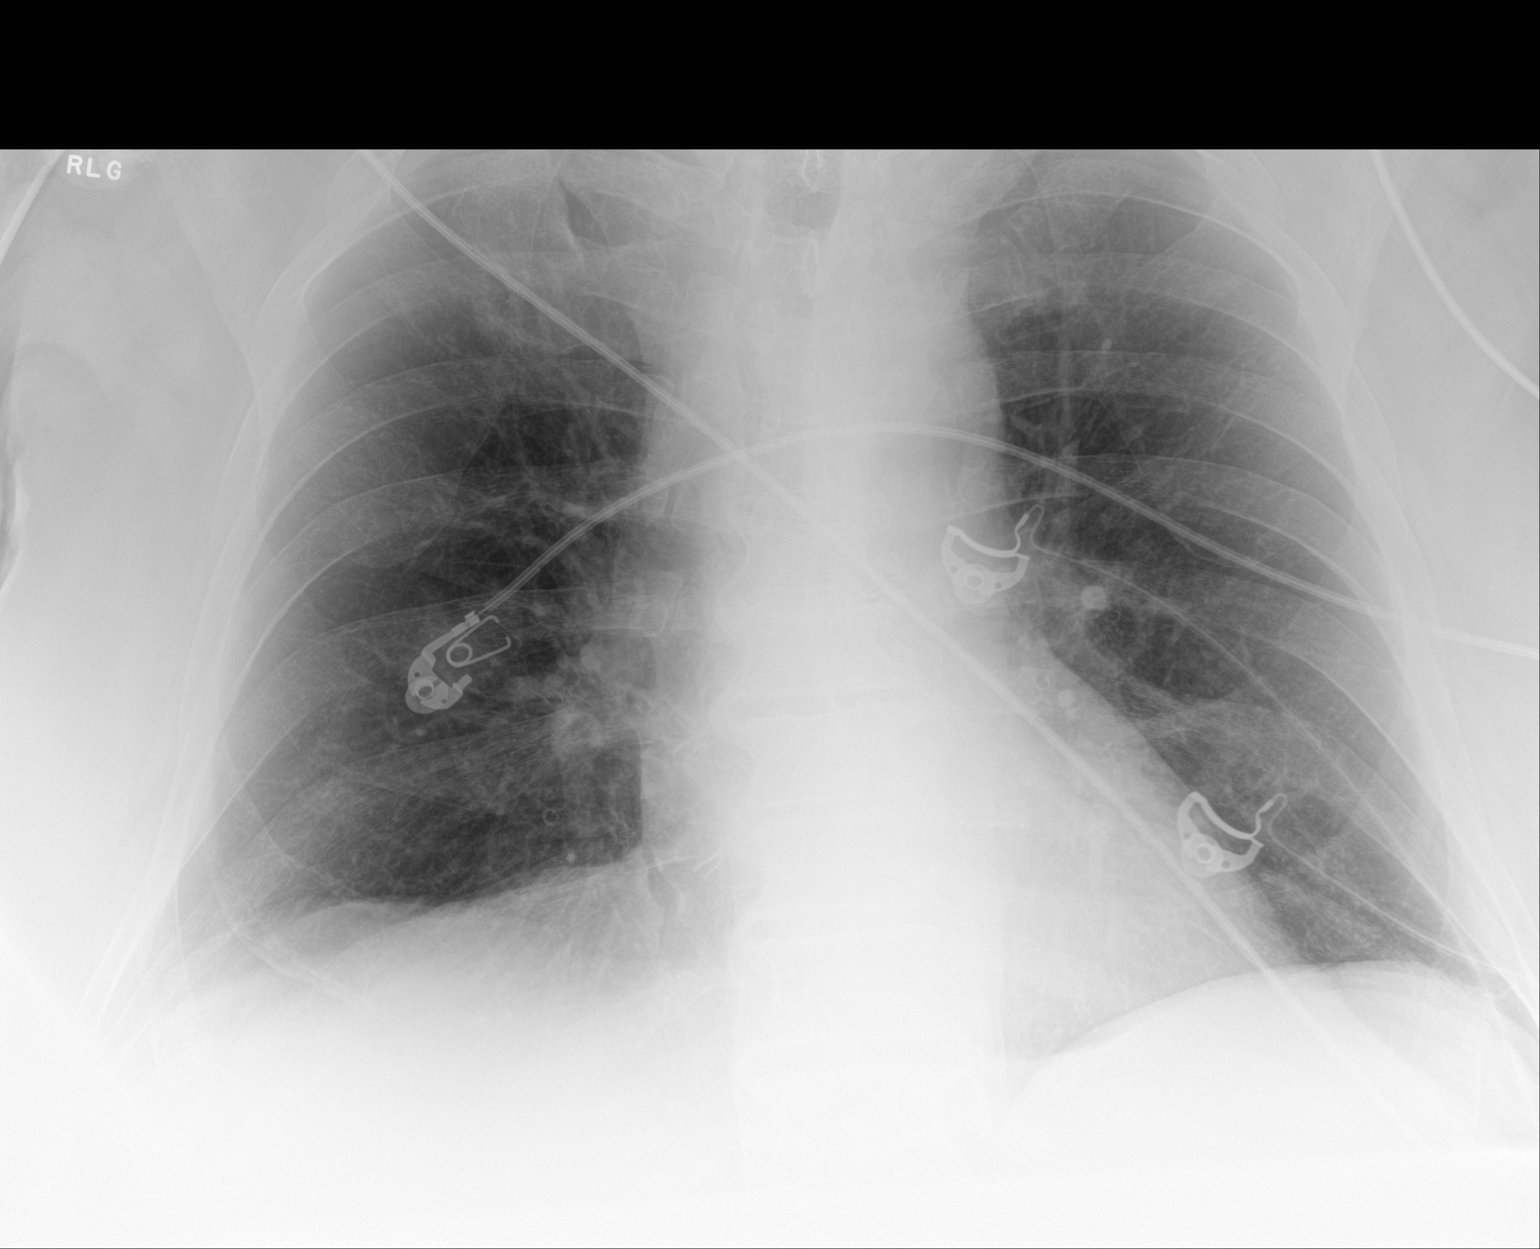

[1 of 1 positions shown; findings below may reference images not displayed]

FINDINGS: There is no evident edema or consolidation. Heart size and pulmonary
vascularity are normal. No adenopathy. No bone lesions.
IMPRESSION: No edema or consolidation.

## 2019-08-15 DIAGNOSIS — C7989 Secondary malignant neoplasm of other specified sites: Secondary | ICD-10-CM | POA: Diagnosis not present

## 2019-08-15 DIAGNOSIS — Z87891 Personal history of nicotine dependence: Secondary | ICD-10-CM | POA: Diagnosis not present

## 2019-08-15 DIAGNOSIS — Z906 Acquired absence of other parts of urinary tract: Secondary | ICD-10-CM | POA: Diagnosis not present

## 2019-08-15 DIAGNOSIS — C680 Malignant neoplasm of urethra: Secondary | ICD-10-CM | POA: Diagnosis not present

## 2019-08-16 DIAGNOSIS — Z23 Encounter for immunization: Secondary | ICD-10-CM | POA: Diagnosis not present

## 2019-09-23 ENCOUNTER — Other Ambulatory Visit: Payer: Self-pay | Admitting: Family Medicine

## 2019-09-25 NOTE — Progress Notes (Signed)
Phone [phone]   Subjective:  Charles Castillo. is a 85 y.o. year old very pleasant male patient who presents for/with See problem oriented charting Chief Complaint  Patient presents with  . Follow-up  . Diabetes  . Hypertension  . Hyperlipidemia   ROS- No chest pain or shortness of breath. No headache or blurry vision. No edema.    Past Medical History-  Patient Active Problem List   Diagnosis Date Noted  . Mild aortic stenosis 10/25/2018    Priority: High  . Type II diabetes mellitus with stage 4 chronic kidney disease ([place]) 09/15/2014    Priority: High  . CKD (chronic kidney disease), stage IV ([place]) 03/24/2008    Priority: High  . Dysphagia 10/30/2017    Priority: Medium  . Gout 05/24/2016    Priority: Medium  . Erectile dysfunction 09/15/2014    Priority: Medium  . 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 Hyperlipidemia associated with type 2 diabetes mellitus ([place]) 08/27/2007    Priority: Medium  . Hypertension associated with diabetes ([place]) 07/03/2007    Priority: Medium  . Sleep apnea 07/03/2007    Priority: Medium  . Former smoker 09/15/2014    Priority: Low  . Morbid obesity ([place]) 10/10/2012    Priority: Low  . Osteoarthritis 07/03/2007    Priority: Low  . COVID-19 virus infection 06/20/2019  . Generalized weakness 06/19/2019  . Secondary hyperparathyroidism ([place]) 04/10/2019  . Syncope 05/23/2017    Medications- reviewed and updated Current Outpatient Medications  Medication Sig Dispense Refill  . atorvastatin (LIPITOR) 40 MG tablet Take 1 tablet (40 mg total) by mouth at bedtime. 90 tablet 3  . OVER THE COUNTER MEDICATION Take 1 tablet by mouth at bedtime as needed (sleep). [employer] brand sleep aid    . pantoprazole (PROTONIX) 40 MG tablet Take 1 tablet (40 mg total) by mouth 2 (two) times daily. (Patient taking differently: Take 40 mg by mouth at bedtime. ) 180 tablet 0  . amLODipine (NORVASC) 5 MG tablet Take 1 tablet (5 mg  total) by mouth daily. (Patient taking differently: Take 5 mg by mouth at bedtime. ) 90 tablet 3   No current facility-administered medications for this visit.      Objective:  BP 130/80   Pulse 72   Temp 98.2 F (36.8 C)   Ht 6\' 2"  (1.88 m)   Wt 268 lb (121.6 kg)   SpO2 98%   BMI 34.41 kg/m  Gen: NAD, resting comfortably CV: RRR no murmurs rubs or gallops Lungs: CTAB no crackles, wheeze, rhonchi Abdomen: soft/nontender/nondistended/normal bowel sounds. Ext: trace edema Skin: warm, dry Neuro: grossly normal- normal gait and speech   Diabetic Foot Exam - Simple   Simple Foot Form Diabetic Foot exam was performed with the following findings: Yes 09/26/2019 10:38 AM  Visual Inspection No deformities, no ulcerations, no other skin breakdown bilaterally: Yes Sensation Testing Intact to touch and monofilament testing bilaterally: Yes Pulse Check Posterior Tibialis and Dorsalis pulse intact bilaterally: Yes Comments        Assessment and Plan   # Post covid- has lost some weight, doesn't feel like energy levels have responded as quickly as he would like. Feels like getting stronger everyday.    #Hypertension/CKD IV S: Compliant with amlodipine 5 mg  He follows with Dr. Hollie Salk of nephrology.  No microalbuminuria and nephrology has not recommended ACE inhibitor or arb. Knows to avoid ibuprofen/aleve/motrin. A/P: Blood pressure looks good today.  Will monitor  blood work today with CMP and microalbumin to creatinine ratio- will use hat due to urethra location.    #Hyperlipidemia S: Patient is compliant with atorvastatin 40 mg.  Prior was on aspirin but we took him off of this for primary prevention. Lab Results  Component Value Date   CHOL 167 04/11/2019   HDL 42.70 04/11/2019   LDLCALC 103 (H) 04/11/2019   LDLDIRECT 123.0 10/12/2018   TRIG 150 (H) 06/19/2019   CHOLHDL 4 04/11/2019   A/P: Mild LDL elevation-would prefer under 100.  At patient's age for primary prevention  do not feel strongly about increasing dose of medicine now.  Will focus on lifestyle changes instead - LFT issues isn the past but not recently.  Lab Results  Component Value Date   ALT 34 06/24/2019   AST 37 06/24/2019   ALKPHOS 64 06/24/2019   BILITOT 0.5 06/24/2019    #Diabetes S: Has been diet controlled on recent checks  Lab Results  Component Value Date   HGBA1C 6.2 (H) 06/22/2019   HGBA1C 6.5 04/11/2019   HGBA1C 6.4 10/12/2018  A/P: Doing well previously-update A1c at this time.  As long as A1c remains below 7 remain off medication  #Mild aortic stenosis S: Last echocardiogram was November 2019-1 to 2-year repeat recommended. A/P: With current coronavirus pandemic-patient would like to defer until next visit or perhaps 12 months   % Urethral carcinoma-patient follows with [facility] Dr. Dimas Millin.History of urethrectomy and bilateral lymph node dissection. Saw [facility] 08/15/2019 and had mri ab pelvis and no evidence of recurrence! Plan for 6 month repeat- 2 years out from cancer.   #Gout-elevated uric acid level in the past.  No recent gout flares-patient wants to remain off medicine and we opted to only check uric acid if has flare ups.    Recommended follow up: 60-month follow-up or sooner if needed  Lab/Order associations:   ICD-10-CM   1. Type 2 diabetes mellitus with stage 4 chronic kidney disease, without long-term current use of insulin (HCC)  E11.22 Comprehensive metabolic panel   I35.6 Hemoglobin A1c    Microalbumin / creatinine urine ratio    CANCELED: Microalbumin / creatinine urine ratio  2. CKD (chronic kidney disease), stage IV (HCC)  N18.4   3. Hypertension associated with diabetes ([place])  E11.59    I10   4. Hyperlipidemia associated with type 2 diabetes mellitus ([place])  E11.69    E78.5   5. 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  Garret Reddish, MD

## 2019-09-25 NOTE — Patient Instructions (Addendum)
No changes today   Please stop by lab before you go If you do not have mychart- we will call you about results within 5 business days of [place] receiving them.  If you have mychart- we will send your results within 3 business days of [place] receiving them.  If abnormal or we want to clarify a result, we will call or mychart you to make sure you receive the message.  If you have questions or concerns or don't hear within 5-7 days, please send [place] a message or call us.

## 2019-09-26 ENCOUNTER — Other Ambulatory Visit: Payer: Self-pay

## 2019-09-26 ENCOUNTER — Encounter: Payer: Self-pay | Admitting: Family Medicine

## 2019-09-26 ENCOUNTER — Ambulatory Visit (INDEPENDENT_AMBULATORY_CARE_PROVIDER_SITE_OTHER): Payer: Medicare Other | Admitting: Family Medicine

## 2019-09-26 VITALS — BP 130/80 | HR 72 | Temp 98.2°F | Ht 74.0 in | Wt 268.0 lb

## 2019-09-26 DIAGNOSIS — I10 Essential (primary) hypertension: Secondary | ICD-10-CM | POA: Diagnosis not present

## 2019-09-26 DIAGNOSIS — C680 Malignant neoplasm of urethra: Secondary | ICD-10-CM | POA: Diagnosis not present

## 2019-09-26 DIAGNOSIS — E1122 Type 2 diabetes mellitus with diabetic chronic kidney disease: Secondary | ICD-10-CM | POA: Diagnosis not present

## 2019-09-26 DIAGNOSIS — E785 Hyperlipidemia, unspecified: Secondary | ICD-10-CM

## 2019-09-26 DIAGNOSIS — E1169 Type 2 diabetes mellitus with other specified complication: Secondary | ICD-10-CM | POA: Diagnosis not present

## 2019-09-26 DIAGNOSIS — N184 Chronic kidney disease, stage 4 (severe): Secondary | ICD-10-CM | POA: Diagnosis not present

## 2019-09-26 DIAGNOSIS — I152 Hypertension secondary to endocrine disorders: Secondary | ICD-10-CM

## 2019-09-26 DIAGNOSIS — E1159 Type 2 diabetes mellitus with other circulatory complications: Secondary | ICD-10-CM | POA: Diagnosis not present

## 2019-09-26 LAB — COMPREHENSIVE METABOLIC PANEL
ALT: 21 U/L (ref 0–53)
AST: 19 U/L (ref 0–37)
Albumin: 4 g/dL (ref 3.5–5.2)
Alkaline Phosphatase: 91 U/L (ref 39–117)
BUN: 29 mg/dL — ABNORMAL HIGH (ref 6–23)
CO2: 24 mEq/L (ref 19–32)
Calcium: 9.6 mg/dL (ref 8.4–10.5)
Chloride: 106 mEq/L (ref 96–112)
Creatinine, Ser: 2.37 mg/dL — ABNORMAL HIGH (ref 0.40–1.50)
GFR: 26.37 mL/min — ABNORMAL LOW (ref >60.00)
Glucose, Bld: 127 mg/dL — ABNORMAL HIGH (ref 70–99)
Potassium: 4.8 mEq/L (ref 3.5–5.1)
Sodium: 137 mEq/L (ref 135–145)
Total Bilirubin: 0.6 mg/dL (ref 0.2–1.2)
Total Protein: 6.8 g/dL (ref 6.0–8.3)

## 2019-09-26 LAB — HEMOGLOBIN A1C: Hgb A1c MFr Bld: 6 % (ref 4.6–6.5)

## 2019-09-26 LAB — MICROALBUMIN / CREATININE URINE RATIO
Creatinine,U: 70.6 mg/dL
Microalb Creat Ratio: 11.6 mg/g (ref 0.0–30.0)
Microalb, Ur: 8.2 mg/dL — ABNORMAL HIGH (ref 0.0–1.9)

## 2019-11-12 DIAGNOSIS — E119 Type 2 diabetes mellitus without complications: Secondary | ICD-10-CM | POA: Diagnosis not present

## 2019-11-12 DIAGNOSIS — Z961 Presence of intraocular lens: Secondary | ICD-10-CM | POA: Diagnosis not present

## 2019-11-12 DIAGNOSIS — H524 Presbyopia: Secondary | ICD-10-CM | POA: Diagnosis not present

## 2019-11-12 DIAGNOSIS — H04122 Dry eye syndrome of left lacrimal gland: Secondary | ICD-10-CM | POA: Diagnosis not present

## 2019-11-12 LAB — HM DIABETES EYE EXAM

## 2019-12-10 ENCOUNTER — Encounter: Payer: Self-pay | Admitting: Family Medicine

## 2019-12-13 ENCOUNTER — Other Ambulatory Visit: Payer: Self-pay | Admitting: Family Medicine

## 2019-12-27 ENCOUNTER — Encounter: Payer: Self-pay | Admitting: Family Medicine

## 2019-12-30 ENCOUNTER — Ambulatory Visit: Payer: Medicare Other

## 2019-12-31 ENCOUNTER — Ambulatory Visit: Payer: Medicare Other | Attending: Internal Medicine

## 2019-12-31 DIAGNOSIS — Z23 Encounter for immunization: Secondary | ICD-10-CM

## 2019-12-31 NOTE — Progress Notes (Signed)
   [id number]  Name:  Charles Castillo.    MRN: [id number] DOB: 1934/09/08  12/31/2019  Charles Castillo was observed post Covid-19 immunization for 15 minutes without incidence. He was provided with Vaccine Information Sheet and instruction to access the V-Safe system.   Charles Castillo was instructed to call 911 with any severe reactions post vaccine: Marland Kitchen Difficulty breathing  . Swelling of your face and throat  . A fast heartbeat  . A bad rash all over your body  . Dizziness and weakness    Immunizations Administered    Name Date Dose VIS Date Route   Pfizer COVID-19 Vaccine 12/31/2019 11:55 AM 0.3 mL 11/29/2019 Intramuscular   Manufacturer: [place]   Lot: [id number]   [place]: [id number]

## 2020-01-07 ENCOUNTER — Ambulatory Visit: Payer: Medicare Other

## 2020-01-20 ENCOUNTER — Ambulatory Visit: Payer: Medicare Other | Attending: Internal Medicine

## 2020-01-20 DIAGNOSIS — Z23 Encounter for immunization: Secondary | ICD-10-CM | POA: Insufficient documentation

## 2020-01-20 NOTE — Progress Notes (Signed)
   [id number]  Name:  Charles Castillo.    MRN: [id number] DOB: 06/07/1934  01/20/2020  Mr. Wulff was observed post Covid-19 immunization for 15 minutes without incidence. He was provided with Vaccine Information Sheet and instruction to access the V-Safe system.   Mr. Difrancesco was instructed to call 911 with any severe reactions post vaccine: Marland Kitchen Difficulty breathing  . Swelling of your face and throat  . A fast heartbeat  . A bad rash all over your body  . Dizziness and weakness    Immunizations Administered    Name Date Dose VIS Date Route   Pfizer COVID-19 Vaccine 01/20/2020 10:06 AM 0.3 mL 11/29/2019 Intramuscular   Manufacturer: [place]   Lot: [id number]   [place]: [id number]

## 2020-02-05 DIAGNOSIS — I129 Hypertensive chronic kidney disease with stage 1 through stage 4 chronic kidney disease, or unspecified chronic kidney disease: Secondary | ICD-10-CM | POA: Diagnosis not present

## 2020-02-05 DIAGNOSIS — N2581 Secondary hyperparathyroidism of renal origin: Secondary | ICD-10-CM | POA: Diagnosis not present

## 2020-02-05 DIAGNOSIS — N184 Chronic kidney disease, stage 4 (severe): Secondary | ICD-10-CM | POA: Diagnosis not present

## 2020-02-05 DIAGNOSIS — N189 Chronic kidney disease, unspecified: Secondary | ICD-10-CM | POA: Diagnosis not present

## 2020-02-05 DIAGNOSIS — D631 Anemia in chronic kidney disease: Secondary | ICD-10-CM | POA: Diagnosis not present

## 2020-02-20 DIAGNOSIS — Z87891 Personal history of nicotine dependence: Secondary | ICD-10-CM | POA: Diagnosis not present

## 2020-02-20 DIAGNOSIS — Z8042 Family history of malignant neoplasm of prostate: Secondary | ICD-10-CM | POA: Diagnosis not present

## 2020-02-20 DIAGNOSIS — C680 Malignant neoplasm of urethra: Secondary | ICD-10-CM | POA: Diagnosis not present

## 2020-02-20 DIAGNOSIS — C609 Malignant neoplasm of penis, unspecified: Secondary | ICD-10-CM | POA: Diagnosis not present

## 2020-02-20 DIAGNOSIS — Z79899 Other long term (current) drug therapy: Secondary | ICD-10-CM | POA: Diagnosis not present

## 2020-02-20 DIAGNOSIS — Z807 Family history of other malignant neoplasms of lymphoid, hematopoietic and related tissues: Secondary | ICD-10-CM | POA: Diagnosis not present

## 2020-02-20 DIAGNOSIS — Z08 Encounter for follow-up examination after completed treatment for malignant neoplasm: Secondary | ICD-10-CM | POA: Diagnosis not present

## 2020-02-20 DIAGNOSIS — Z8559 Personal history of malignant neoplasm of other urinary tract organ: Secondary | ICD-10-CM | POA: Diagnosis not present

## 2020-02-24 ENCOUNTER — Telehealth: Payer: Self-pay | Admitting: Internal Medicine

## 2020-02-24 MED ORDER — PANTOPRAZOLE SODIUM 40 MG PO TBEC: 40.0000 mg | DELAYED_RELEASE_TABLET | Freq: Two times a day (BID) | ORAL | 1 refills | Status: DC

## 2020-02-24 NOTE — Telephone Encounter (Signed)
Pt requested a refill for pantoprazole sent to [facility].

## 2020-02-24 NOTE — Telephone Encounter (Signed)
Patient advised that he will need office visit for further refills as he has not been seen in 2 years. Patient has scheduled a follow up with Dr Hilarie Fredrickson on 04/14/20 at 9:30 am. I have sent enough medication to get him through until his upcoming appointment.

## 2020-03-26 ENCOUNTER — Ambulatory Visit (INDEPENDENT_AMBULATORY_CARE_PROVIDER_SITE_OTHER): Payer: Medicare Other | Admitting: Family Medicine

## 2020-03-26 ENCOUNTER — Encounter: Payer: Self-pay | Admitting: Family Medicine

## 2020-03-26 ENCOUNTER — Other Ambulatory Visit: Payer: Self-pay

## 2020-03-26 VITALS — BP 126/78 | HR 73 | Temp 98.6°F | Ht 74.0 in | Wt 272.8 lb

## 2020-03-26 DIAGNOSIS — I10 Essential (primary) hypertension: Secondary | ICD-10-CM | POA: Diagnosis not present

## 2020-03-26 DIAGNOSIS — I350 Nonrheumatic aortic (valve) stenosis: Secondary | ICD-10-CM | POA: Diagnosis not present

## 2020-03-26 DIAGNOSIS — E785 Hyperlipidemia, unspecified: Secondary | ICD-10-CM | POA: Diagnosis not present

## 2020-03-26 DIAGNOSIS — N184 Chronic kidney disease, stage 4 (severe): Secondary | ICD-10-CM

## 2020-03-26 DIAGNOSIS — C680 Malignant neoplasm of urethra: Secondary | ICD-10-CM

## 2020-03-26 DIAGNOSIS — E1122 Type 2 diabetes mellitus with diabetic chronic kidney disease: Secondary | ICD-10-CM | POA: Diagnosis not present

## 2020-03-26 DIAGNOSIS — I152 Hypertension secondary to endocrine disorders: Secondary | ICD-10-CM

## 2020-03-26 DIAGNOSIS — E1159 Type 2 diabetes mellitus with other circulatory complications: Secondary | ICD-10-CM | POA: Diagnosis not present

## 2020-03-26 DIAGNOSIS — E1169 Type 2 diabetes mellitus with other specified complication: Secondary | ICD-10-CM

## 2020-03-26 LAB — POCT GLYCOSYLATED HEMOGLOBIN (HGB A1C): Hemoglobin A1C: 5.8 % — AB (ref 4.0–5.6)

## 2020-03-26 MED ORDER — ATORVASTATIN CALCIUM 40 MG PO TABS: 40.0000 mg | ORAL_TABLET | Freq: Every day | ORAL | 3 refills | Status: DC

## 2020-03-26 MED ORDER — AMLODIPINE BESYLATE 5 MG PO TABS: 5.0000 mg | ORAL_TABLET | Freq: Every day | ORAL | 3 refills | Status: DC

## 2020-03-26 MED ORDER — AMLODIPINE BESYLATE 5 MG PO TABS: 5.0000 mg | ORAL_TABLET | Freq: Every day | ORAL | 1 refills | Status: DC

## 2020-03-26 NOTE — Patient Instructions (Addendum)
Lab Results  Component Value Date   LABURIC 8.2 (H) 05/24/2016  last uric acid several years ago was high but you have not had gout flares and wanted to remain off medicine- my only question is from a kidney perspective would Dr. Hollie Salk want me to have you on medicine- please ask next visit- you can take this paper with you  We opted to hold off on labs since just had last month- at 6 months would do full bloodwork- a1c, blood counts, kidney, liver, cholesteorl.   Glad you are doing so well!      Recommended follow up: No follow-ups on file.

## 2020-03-26 NOTE — Progress Notes (Signed)
Phone [phone] In person visit   Subjective:   Charles Castillo. is a 86 y.o. year old very pleasant male patient who presents for/with See problem oriented charting Chief Complaint  Patient presents with  . Follow-up  . Diabetes   This visit occurred during the SARS-CoV-2 public health emergency.  Safety protocols were in place, including screening questions prior to the visit, additional usage of staff PPE, and extensive cleaning of exam room while observing appropriate contact time as indicated for disinfecting solutions.   Past Medical History-  Patient Active Problem List   Diagnosis Date Noted  . Mild aortic stenosis 10/25/2018    Priority: High  . Type II diabetes mellitus with stage 4 chronic kidney disease ([place]) 09/15/2014    Priority: High  . CKD (chronic kidney disease), stage IV ([place]) 03/24/2008    Priority: High  . Dysphagia 10/30/2017    Priority: Medium  . Gout 05/24/2016    Priority: Medium  . Erectile dysfunction 09/15/2014    Priority: Medium  . 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[place]) 11/04/2009    Priority: Medium  . Hyperlipidemia associated with type 2 diabetes mellitus ([place]) 08/27/2007    Priority: Medium  . Hypertension associated with diabetes ([place]) 07/03/2007    Priority: Medium  . Sleep apnea 07/03/2007    Priority: Medium  . Former smoker 09/15/2014    Priority: Low  . Morbid obesity ([place]) 10/10/2012    Priority: Low  . Osteoarthritis 07/03/2007    Priority: Low  . COVID-19 virus infection 06/20/2019  . Generalized weakness 06/19/2019  . Syncope 05/23/2017    Medications- reviewed and updated Current Outpatient Medications  Medication Sig Dispense Refill  . amLODipine (NORVASC) 5 MG tablet Take 1 tablet (5 mg total) by mouth at bedtime. 90 tablet 1  . atorvastatin (LIPITOR) 40 MG tablet Take 1 tablet (40 mg total) by mouth at bedtime. 90 tablet 3  . OVER THE COUNTER MEDICATION Take 1 tablet by mouth at bedtime as needed (sleep).  [employer] brand sleep aid    . pantoprazole (PROTONIX) 40 MG tablet Take 1 tablet (40 mg total) by mouth 2 (two) times daily. 60 tablet 1   No current facility-administered medications for this visit.     Objective:  BP 126/78   Pulse 73   Temp 98.6 F (37 C) (Temporal)   Ht 6\' 2"  (1.88 m)   Wt 272 lb 12.8 oz (123.7 kg)   SpO2 97%   BMI 35.03 kg/m  Gen: NAD, resting comfortably CV: RRR 3/6 murmur Lungs: CTAB no crackles, wheeze, rhonchi Ext: trace edema Skin: warm, dry    Assessment and Plan    #Hypertension/CKD IV S: Compliant with amlodipine 5 mg. Does not check readings at home.   He follows with Dr. Hollie Salk of nephrology.  No microalbuminuria and nephrology has not recommended ACE inhibitor or arb  BP Readings from Last 3 Encounters:  03/26/20 126/78  09/26/19 130/80  06/24/19 122/78    A/P: Excellent control today-continue current medications  In regards to CKD stage IV-continue follow-up with nephrology-we will also update CMP today . Recently saw [facility] and was told kidney function stable -reviewed labs from [facility] and creatinine was relatively stbale at 2.7 with GFR of 21- we will not repeat today   #Hyperlipidemia S: Patient is compliant with atorvastatin 40 mg.  Prior was on aspirin but we took him off of this for primary prevention. Patient has walk every other day for 20-30 minutes.  Lab  Results  Component Value Date   CHOL 167 04/11/2019   HDL 42.70 04/11/2019   LDLCALC 103 (H) 04/11/2019   LDLDIRECT 123.0 10/12/2018   TRIG 150 (H) 06/19/2019   CHOLHDL 4 04/11/2019  A/P: Mild poor control on last check-we discussed with his diabetes if numbers are still above goal possibly stopping atorvastatin and starting rosuvastatin 40 mg. -just had labs done in march and prefers to do lipid panel at 6 month follow up which I think is reasonable -cbc/cmp largely reassuring/stable last month so we will defer   #Diabetes/morbid obesity with BMI over 35 and HTn and  diabetes S: Has been diet controlled on recent checks. Does not check readings at home.  Lab Results  Component Value Date   HGBA1C point-of-care 5.8 (A) 03/26/2020   HGBA1C 6.0 09/26/2019   HGBA1C 6.2 (H) 06/22/2019  A/P: Reasonable control-continue without medication-glad he is walking and encouraged him to keep this up.    For morbid obesity- Weight has trended up 4 pounds from last visit and encouraged reversal of this trend. Encouraged need for healthy eating, regular exercise, weight loss.    #Mild aortic stenosis S: Last echocardiogram was June 23, 2019-still noted as mild. Prior noted as mild to moderate.  A/P: Appears to be stable-plan patient has an updated echocardiogram-we will repeat in 1 to 2 years  % Urethral carcinoma-patient follows with [facility].History of urethrectomy and bilateral lymph node dissection.Encouraged patient to continue close follow-up . He ws allowed to be stretched to 1 year follow up from 6 months- was given [place] bill of health recently - I do not see code for history of- so we maintained current diagnosis code  #Denies recent gout flares-known elevated uric acid level-we discussed this would have some potential to affect kidneys as she still prefers to remain off medicine- but we will ask Dr. Hollie Salk   Lab Results  Component Value Date   LABURIC 8.2 (H) 05/24/2016    Recommended follow up: Return in about 6 months (around 09/25/2020) for physical or sooner if needed. Future Appointments  Date Time Provider [place]  04/14/2020  9:30 AM Pyrtle, Lajuan Lines, MD [facility]  to discuss GERD on pantoprazole  Lab/Order associations:   ICD-10-CM   1. Type 2 diabetes mellitus with stage 4 chronic kidney disease, without long-term current use of insulin (HCC)  E11.22 POCT HgB A1C   N18.4   2. Mild aortic stenosis  I35.0   3. CKD (chronic kidney disease), stage IV (HCC)  N18.4   4. Urethral carcinoma ([place])  C68.0   5. Hypertension associated with  diabetes ([place])  E11.59    I10   6. Hyperlipidemia associated with type 2 diabetes mellitus (HCC)  E11.69    E78.5   7. Morbid obesity ([place]) Chronic E66.01     Meds ordered this encounter  Medications  . atorvastatin (LIPITOR) 40 MG tablet    Sig: Take 1 tablet (40 mg total) by mouth at bedtime.    Dispense:  90 tablet    Refill:  3  . amLODipine (NORVASC) 5 MG tablet    Sig: Take 1 tablet (5 mg total) by mouth at bedtime.    Dispense:  90 tablet    Refill:  1   Return precautions advised.  Garret Reddish, MD

## 2020-04-10 ENCOUNTER — Encounter: Payer: Self-pay | Admitting: *Deleted

## 2020-04-14 ENCOUNTER — Ambulatory Visit (INDEPENDENT_AMBULATORY_CARE_PROVIDER_SITE_OTHER): Payer: Medicare Other | Admitting: Internal Medicine

## 2020-04-14 ENCOUNTER — Other Ambulatory Visit: Payer: Self-pay

## 2020-04-14 ENCOUNTER — Encounter: Payer: Self-pay | Admitting: Internal Medicine

## 2020-04-14 VITALS — BP 124/62 | HR 74 | Temp 97.6°F | Ht 74.0 in | Wt 272.0 lb

## 2020-04-14 DIAGNOSIS — K2100 Gastro-esophageal reflux disease with esophagitis, without bleeding: Secondary | ICD-10-CM | POA: Diagnosis not present

## 2020-04-14 DIAGNOSIS — K449 Diaphragmatic hernia without obstruction or gangrene: Secondary | ICD-10-CM | POA: Diagnosis not present

## 2020-04-14 MED ORDER — PANTOPRAZOLE SODIUM 40 MG PO TBEC: 40.0000 mg | DELAYED_RELEASE_TABLET | Freq: Two times a day (BID) | ORAL | 3 refills | Status: DC

## 2020-04-14 NOTE — Patient Instructions (Signed)
Continue pantoprazole. We have sent refills of this to your mail in pharmacy.  Please follow up with Dr Hilarie Fredrickson in 1-2 years.  If you are age 84 or older, your body mass index should be between 23-30. Your Body mass index is 34.92 kg/m. If this is out of the aforementioned range listed, please consider follow up with your Primary Care Provider.  If you are age 14 or younger, your body mass index should be between 19-25. Your Body mass index is 34.92 kg/m. If this is out of the aformentioned range listed, please consider follow up with your Primary Care Provider.

## 2020-04-14 NOTE — Progress Notes (Signed)
   Subjective:    Patient ID: Charles Castillo., male    DOB: 02-15-1934, 86 y.o.   MRN: [id number]  HPI [name] is an 86 year old male with a history of GERD with reflux esophagitis, hiatal hernia, gastroduodenitis without H. pylori who is here for follow-up.  He also has a history of hypertension, diabetes, mild aortic stenosis, sleep apnea, history of urothelial carcinoma.  He was last seen in 2019.  He had a surveillance upper endoscopy performed on 02/21/2018 to follow-up severe ulcerative esophagitis seen by EGD in January 2019.  On this upper endoscopy he had a small inlet patch.  His esophageal mucosa was normal and previous esophagitis had healed.  There was irregular Z-line.  He had a 4 cm hiatal hernia.  There was mildly erythematous mucosa in the gastric cardia at the hiatal hernia but this had improved.  There was diffuse moderate inflammation in the duodenal bulb which was biopsied and found to be peptic related inflammation.  He has been maintained on pantoprazole 40 mg a day.  This has led to resolution of his heartburn and GERD symptoms.  He is also had no further dysphagia symptom.  He previously did have dysphagia when his esophagitis was most active.  He is not having odynophagia.  No nausea or vomiting.  Good appetite.  No abdominal pain.  Normal bowel movements.  No blood in stool or melena.  Remotely he had colonoscopies on multiple occasions but never recalls polyps.  There is no family history of colon cancer.   Review of Systems As per HPI, otherwise negative  Current Medications, Allergies, Past Medical History, Past Surgical History, Family History and Social History were reviewed in [employer] record.     Objective:   Physical Exam BP 124/62   Pulse 74   Temp 97.6 F (36.4 C)   Ht 6\' 2"  (1.88 m)   Wt 272 lb (123.4 kg)   BMI 34.92 kg/m  Gen: awake, alert, NAD HEENT: anicteric CV: RRR, soft systolic ejection murmur Pulm: CTA  b/l Abd: soft, obese, NT/ND, +BS throughout Ext: no c/c/e Neuro: nonfocal     Assessment & Plan:  86 year old male with a history of GERD with reflux esophagitis, hiatal hernia, gastroduodenitis without H. pylori who is here for follow-up.   1.  GERD with history of reflux esophagitis/hiatal hernia --symptoms are well controlled with once daily PPI.  With his hiatal hernia he is high risk for recurrence of esophagitis and symptomatic dysphagia if reflux is not controlled.  We will continue pantoprazole 40 mg once daily.  No alarm symptoms to warrant further investigation.  2.  CRC screening --we discussed prior colorectal cancer screening and he has had multiple colonoscopies in the past.  It appears to me that his last exam was greater than 10 years ago.  We discussed this at length today including how colonoscopy generally stops around age 66 for screening.  He is not interested in recurrent colorectal cancer screening with colonoscopy which I think is reasonable based on his age.  We discussed how this does not mean that we would not investigate symptoms should they arise.  He can follow-up every 1 to 2 years, sooner if needed  20 minutes total spent today including patient facing time, coordination of care, reviewing medical history/procedures/pertinent radiology studies, and documentation of the encounter.

## 2020-04-24 DIAGNOSIS — I10 Essential (primary) hypertension: Secondary | ICD-10-CM | POA: Diagnosis not present

## 2020-04-24 DIAGNOSIS — H6123 Impacted cerumen, bilateral: Secondary | ICD-10-CM | POA: Diagnosis not present

## 2020-05-20 DIAGNOSIS — N3941 Urge incontinence: Secondary | ICD-10-CM | POA: Diagnosis not present

## 2020-05-20 DIAGNOSIS — R3915 Urgency of urination: Secondary | ICD-10-CM | POA: Diagnosis not present

## 2020-05-20 DIAGNOSIS — N401 Enlarged prostate with lower urinary tract symptoms: Secondary | ICD-10-CM | POA: Diagnosis not present

## 2020-08-03 ENCOUNTER — Encounter: Payer: Self-pay | Admitting: Family Medicine

## 2020-08-11 DIAGNOSIS — D225 Melanocytic nevi of trunk: Secondary | ICD-10-CM | POA: Diagnosis not present

## 2020-08-11 DIAGNOSIS — L821 Other seborrheic keratosis: Secondary | ICD-10-CM | POA: Diagnosis not present

## 2020-08-11 DIAGNOSIS — L245 Irritant contact dermatitis due to other chemical products: Secondary | ICD-10-CM | POA: Diagnosis not present

## 2020-08-11 DIAGNOSIS — L304 Erythema intertrigo: Secondary | ICD-10-CM | POA: Diagnosis not present

## 2020-08-11 DIAGNOSIS — Z85828 Personal history of other malignant neoplasm of skin: Secondary | ICD-10-CM | POA: Diagnosis not present

## 2020-08-11 DIAGNOSIS — L2089 Other atopic dermatitis: Secondary | ICD-10-CM | POA: Diagnosis not present

## 2020-09-10 DIAGNOSIS — Z23 Encounter for immunization: Secondary | ICD-10-CM | POA: Diagnosis not present

## 2020-09-14 ENCOUNTER — Encounter: Payer: Self-pay | Admitting: Family Medicine

## 2020-09-14 DIAGNOSIS — Z23 Encounter for immunization: Secondary | ICD-10-CM | POA: Diagnosis not present

## 2020-09-17 DIAGNOSIS — N189 Chronic kidney disease, unspecified: Secondary | ICD-10-CM | POA: Diagnosis not present

## 2020-09-17 DIAGNOSIS — E1129 Type 2 diabetes mellitus with other diabetic kidney complication: Secondary | ICD-10-CM | POA: Diagnosis not present

## 2020-09-17 DIAGNOSIS — M7989 Other specified soft tissue disorders: Secondary | ICD-10-CM | POA: Diagnosis not present

## 2020-09-17 DIAGNOSIS — D631 Anemia in chronic kidney disease: Secondary | ICD-10-CM | POA: Diagnosis not present

## 2020-09-17 DIAGNOSIS — E1122 Type 2 diabetes mellitus with diabetic chronic kidney disease: Secondary | ICD-10-CM | POA: Diagnosis not present

## 2020-09-17 DIAGNOSIS — E785 Hyperlipidemia, unspecified: Secondary | ICD-10-CM | POA: Diagnosis not present

## 2020-09-17 DIAGNOSIS — I129 Hypertensive chronic kidney disease with stage 1 through stage 4 chronic kidney disease, or unspecified chronic kidney disease: Secondary | ICD-10-CM | POA: Diagnosis not present

## 2020-09-17 DIAGNOSIS — N2581 Secondary hyperparathyroidism of renal origin: Secondary | ICD-10-CM | POA: Diagnosis not present

## 2020-09-17 DIAGNOSIS — N184 Chronic kidney disease, stage 4 (severe): Secondary | ICD-10-CM | POA: Diagnosis not present

## 2020-09-17 LAB — BASIC METABOLIC PANEL
BUN: 37 — AB (ref 4–21)
CO2: 21 (ref 13–22)
Chloride: 107 (ref 99–108)
Creatinine: 2.4 — AB (ref 0.6–1.3)
Glucose: 91
Potassium: 4.7 (ref 3.4–5.3)
Sodium: 141 (ref 137–147)

## 2020-09-17 LAB — FECAL OCCULT BLOOD, GUAIAC: Fecal Occult Blood: NEGATIVE

## 2020-09-17 LAB — LIPID PANEL
Cholesterol: 189 (ref 0–200)
HDL: 45 (ref 35–70)
LDL Cholesterol: 118
Triglycerides: 145 (ref 40–160)

## 2020-09-17 LAB — VITAMIN D 25 HYDROXY (VIT D DEFICIENCY, FRACTURES): Vit D, 25-Hydroxy: 16.3

## 2020-09-17 LAB — COMPREHENSIVE METABOLIC PANEL
Albumin: 4.2 (ref 3.5–5.0)
Calcium: 9.6 (ref 8.7–10.7)
GFR calc Af Amer: 28
GFR calc non Af Amer: 24
Globulin: 2.6

## 2020-09-17 LAB — CBC AND DIFFERENTIAL
HCT: 44 (ref 41–53)
Hemoglobin: 14.7 (ref 13.5–17.5)
Neutrophils Absolute: 5
Platelets: 178 (ref 150–399)
WBC: 8.6

## 2020-09-17 LAB — IRON,TIBC AND FERRITIN PANEL
%SAT: 27
Ferritin: 427
TIBC: 252
UIBC: 185

## 2020-09-17 LAB — HEPATIC FUNCTION PANEL
ALT: 23 (ref 10–40)
AST: 17 (ref 14–40)
Alkaline Phosphatase: 111 (ref 25–125)
Bilirubin, Total: 0.4

## 2020-09-17 LAB — HEMOGLOBIN A1C: Hemoglobin A1C: 6.3

## 2020-09-17 LAB — CBC: RBC: 4.8 (ref 3.87–5.11)

## 2020-09-18 ENCOUNTER — Other Ambulatory Visit: Payer: Self-pay | Admitting: Nephrology

## 2020-09-18 DIAGNOSIS — N184 Chronic kidney disease, stage 4 (severe): Secondary | ICD-10-CM

## 2020-09-25 NOTE — Progress Notes (Signed)
Phone [phone] In person visit   Subjective:   Charles Castillo. is a 86 y.o. year old very pleasant male patient who presents for/with See problem oriented charting  This visit occurred during the SARS-CoV-2 public health emergency.  Safety protocols were in place, including screening questions prior to the visit, additional usage of staff PPE, and extensive cleaning of exam room while observing appropriate contact time as indicated for disinfecting solutions.   Past Medical History-  Patient Active Problem List   Diagnosis Date Noted  . Mild aortic stenosis 10/25/2018    Priority: High  . Type II diabetes mellitus with stage 4 chronic kidney disease ([place]) 09/15/2014    Priority: High  . CKD (chronic kidney disease), stage IV ([place]) 03/24/2008    Priority: High  . Dysphagia 10/30/2017    Priority: Medium  . Gout 05/24/2016    Priority: Medium  . Erectile dysfunction 09/15/2014    Priority: Medium  . 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[place]) 11/04/2009    Priority: Medium  . Hyperlipidemia associated with type 2 diabetes mellitus ([place]) 08/27/2007    Priority: Medium  . Hypertension associated with diabetes ([place]) 07/03/2007    Priority: Medium  . Sleep apnea 07/03/2007    Priority: Medium  . Former smoker 09/15/2014    Priority: Low  . Morbid obesity ([place]) 10/10/2012    Priority: Low  . Osteoarthritis 07/03/2007    Priority: Low  . COVID-19 virus infection 06/20/2019  . Generalized weakness 06/19/2019  . Syncope 05/23/2017    Medications- reviewed and updated Current Outpatient Medications  Medication Sig Dispense Refill  . amLODipine (NORVASC) 5 MG tablet Take 1 tablet (5 mg total) by mouth at bedtime. 90 tablet 3  . atorvastatin (LIPITOR) 40 MG tablet Take 1 tablet (40 mg total) by mouth at bedtime. 90 tablet 3  . OVER THE COUNTER MEDICATION Take 1 tablet by mouth at bedtime as needed (sleep). [employer] brand sleep aid    . pantoprazole (PROTONIX) 40 MG tablet Take 1  tablet (40 mg total) by mouth 2 (two) times daily. 180 tablet 3   No current facility-administered medications for this visit.     Objective:  BP 118/70   Pulse 71   Temp 97.8 F (36.6 C) (Temporal)   Resp 18   Ht 6\' 2"  (1.88 m)   Wt 276 lb 12.8 oz (125.6 kg)   SpO2 98%   BMI 35.54 kg/m  Gen: NAD, resting comfortably CV: RRR faint murmur stable Lungs: CTAB no crackles, wheeze, rhonchi Abdomen: soft/nontender/nondistended/normal bowel sounds. No rebound or guarding.  Ext: trace edema on left leg, 1+ on right leg- no calf pain  Skin: warm, dry Neuro: grossly normal, moves all extremities     Assessment and Plan   #just had labs with Dr. Hollie Salk- we discussed drawing cbc, cmp, lipid, a1c but he may have just had these so we will look for paperwork fist and then decide   #Hypertension/CKD IV S: Compliant with amlodipine 5 mg  He follows with Dr. Hollie Salk of nephrology.  No microalbuminuria and nephrology has not recommended ACE inhibitor or arb BP Readings from Last 3 Encounters:  09/28/20 118/70  04/14/20 124/62  03/26/20 126/78  A/P: Hypertension well-controlled-continue current medication  CKD stage IV-continue close follow-up with nephrology.  Update CMP today with lab   #Hyperlipidemia S: Patient is compliant with atorvastatin 40 mg.  Prior was on aspirin but we took him off of this for primary prevention. Lab Results  Component  Value Date   CHOL 167 04/11/2019   HDL 42.70 04/11/2019   LDLCALC 103 (H) 04/11/2019   LDLDIRECT 123.0 10/12/2018   TRIG 150 (H) 06/19/2019   CHOLHDL 4 04/11/2019  A/P: Lipids slightly high last year-update lipid panel with labs today and hoping LDL at least under 100-though ideal would be under 70. Lifestyle discussion above   # Diabetes S: Medication: None-has been diet controlled Exercise and diet- hour of exercise 3x a week, trying to eat a reasonable diet - has reduced portion sizes. Drinking more water instead of other option.  Lab  Results  Component Value Date   HGBA1C 5.8 (A) 03/26/2020   HGBA1C 6.0 09/26/2019   HGBA1C 6.2 (H) 06/22/2019  A/P: Suspect will be well controlled-update A1c with labs if not done with Dr. Hollie Salk  #Mild aortic stenosis S: Last echocardiogram was June 23, 2019 A/P: Asymptomatic-we discussed repeating next year-he agrees to this. Feels well overall. I dont think unilateral leg swelling is enough to push [place] to repeat now.    % History of urethral carcinoma-patient follows with [facility].History of urethrectomy and bilateral lymph node dissection. On 1 year follow up at this point.   # GERD/dysphagia S:on high dose PPI twice daily per DR. Pyrtle - down to one dose a day A/P: working well- discussed possible affect on CKD IV - he has worked on cutting down to once a day- hoping he can keep that up.   #Right leg swelling-patient's reports 10 days of right leg swelling.  No calf pain.  Saw Dr. Hollie Salk last week and did some blood work and reported the right leg swelling.  She has him scheduled for an ultrasound of the right leg.  He agrees if he has new or worsening symptoms particularly shortness of breath or worsening swelling in the legs let me know.  As of now it seems to fluctuate some-better some days slightly worse others-he will not enough has significant worsening  # Obesity morbid with BMI over 35 with HTN and HLD S: poor control but lifestyle similar  Wt Readings from Last 3 Encounters:  09/28/20 276 lb 12.8 oz (125.6 kg)  04/14/20 272 lb (123.4 kg)  03/26/20 272 lb 12.8 oz (123.7 kg)   A/P: -Encouraged need for healthy eating, regular exercise, weight loss.    Recommended follow up: Return in about 6 months (around 03/29/2021) for follow up- or sooner if needed. Future Appointments  Date Time Provider [place]  10/01/2020  3:00 PM [facility] [place] [facility]    Lab/Order associations:   ICD-10-CM   2. Hypertension associated with diabetes ([place])  E11.59    I15.2   3.  Hyperlipidemia associated with type 2 diabetes mellitus ([place])  E11.69    E78.5   4. Type 2 diabetes mellitus with stage 4 chronic kidney disease, without long-term current use of insulin (HCC)  E11.22    N18.4   5. Chronic gout without tophus, unspecified cause, unspecified site  [id number]   6. Mild aortic stenosis  I35.0   7. CKD (chronic kidney disease), stage IV (HCC)  N18.4   8. Sleep apnea, unspecified type  G47.30   9. 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[place])  C68.0   10. Morbid obesity ([place])  E66.01     Return precautions advised.  Garret Reddish, MD

## 2020-09-28 ENCOUNTER — Other Ambulatory Visit: Payer: Self-pay

## 2020-09-28 ENCOUNTER — Ambulatory Visit (INDEPENDENT_AMBULATORY_CARE_PROVIDER_SITE_OTHER): Payer: Medicare Other | Admitting: Family Medicine

## 2020-09-28 ENCOUNTER — Encounter: Payer: Self-pay | Admitting: Family Medicine

## 2020-09-28 VITALS — BP 118/70 | HR 71 | Temp 97.8°F | Resp 18 | Ht 74.0 in | Wt 276.8 lb

## 2020-09-28 DIAGNOSIS — G4730 Sleep apnea, unspecified: Secondary | ICD-10-CM | POA: Diagnosis not present

## 2020-09-28 DIAGNOSIS — N184 Chronic kidney disease, stage 4 (severe): Secondary | ICD-10-CM | POA: Diagnosis not present

## 2020-09-28 DIAGNOSIS — E1169 Type 2 diabetes mellitus with other specified complication: Secondary | ICD-10-CM

## 2020-09-28 DIAGNOSIS — I350 Nonrheumatic aortic (valve) stenosis: Secondary | ICD-10-CM | POA: Diagnosis not present

## 2020-09-28 DIAGNOSIS — E1122 Type 2 diabetes mellitus with diabetic chronic kidney disease: Secondary | ICD-10-CM

## 2020-09-28 DIAGNOSIS — Z0000 Encounter for general adult medical examination without abnormal findings: Secondary | ICD-10-CM | POA: Diagnosis not present

## 2020-09-28 DIAGNOSIS — E785 Hyperlipidemia, unspecified: Secondary | ICD-10-CM

## 2020-09-28 DIAGNOSIS — E1159 Type 2 diabetes mellitus with other circulatory complications: Secondary | ICD-10-CM

## 2020-09-28 DIAGNOSIS — I152 Hypertension secondary to endocrine disorders: Secondary | ICD-10-CM

## 2020-09-28 DIAGNOSIS — C680 Malignant neoplasm of urethra: Secondary | ICD-10-CM | POA: Diagnosis not present

## 2020-09-28 DIAGNOSIS — M1A9XX0 Chronic gout, unspecified, without tophus (tophi): Secondary | ICD-10-CM | POA: Diagnosis not present

## 2020-09-28 NOTE — Progress Notes (Signed)
Phone [phone]    Subjective:  Patient presents today for their annual wellness visit (subsequent )  Preventive Screening-Counseling & Management  Modifiable Risk Factors/behavioral risk assessment/psychosocial risk assessment Regular exercise: 1 hour 3x a week Diet: trying to cut down on portion sizes, trying to eat a diet rich fruits in veggies, more water  Wt Readings from Last 3 Encounters:  09/28/20 276 lb 12.8 oz (125.6 kg)  04/14/20 272 lb (123.4 kg)  03/26/20 272 lb 12.8 oz (123.7 kg)   Smoking Status: former Smoker - quit in 1970s- no regular screening. UA done with nephrology or [facility] Second Hand Smoking status: No smokers in home Alcohol intake: 0 per week  Cardiac risk factors:  advanced age (older than 70 for men, 52 for women)  treated Hyperlipidemia  treated Hypertension  Diet treated diabetes.  Lab Results  Component Value Date   HGBA1C 5.8 (A) 03/26/2020  Family History: no CAD but had CHF in father   Depression Screen/risk evaluation Risk factors: none PHQ9 0  Depression screen [facility] 2/9 09/28/2020 03/26/2020 09/26/2019 01/14/2019 10/30/2017  Decreased Interest 0 0 0 0 0  Down, Depressed, Hopeless 0 0 0 0 0  PHQ - 2 Score 0 0 0 0 0  Altered sleeping 0 - - - -  Tired, decreased energy 0 - - - -  Change in appetite 0 - - - -  Feeling bad or failure about yourself  0 - - - -  Trouble concentrating 0 - - - -  Moving slowly or fidgety/restless 0 - - - -  Suicidal thoughts 0 - - - -  PHQ-9 Score 0 - - - -  Difficult doing work/chores Not difficult at all - - - -    Functional ability and level of safety Mobility assessment:  timed get up and go <12 seconds Activities of Daily Living- Independent in ADLs (toileting, bathing, dressing, transferring, eating) and in IADLs (shopping, housekeeping, managing own medications, and handling finances) Home Safety: Loose rugs (no), smoke detectors (up to date), small pets (no), grab bars (in place), stairs (1 set -  does well since having carpetted), life-alert system (would use cell phone) Hearing Difficulties: -patient declines as long as wearin ghearing adis Fall Risk: None  Fall Risk  09/28/2020 03/26/2020 09/26/2019 04/10/2019 11/05/2018  Falls in the past year? 0 0 1 0 0  Comment - - - - Emmi Telephone Survey: data to providers prior to load  Number falls in past yr: 0 0 0 0 -  Injury with Fall? 0 0 0 0 -   Opioid use history:  no long term opioids use Self assessment of health status: "good"  Cognitive Testing             No reported trouble.   Mini cog: normal clock draw. 2/3 delayed recall. Normal test result   List the Names of Other Physician/Practitioners you currently use: Patient Care Team: Marin Olp, MD as PCP - General (Family Medicine) Madelon Lips, MD as Consulting Physician (Nephrology) Janalyn Harder, MD (Urology) Pyrtle, Lajuan Lines, MD as Consulting Physician (Gastroenterology) Irine Seal, MD as Attending Physician (Urology) Martinique, Amy, MD as Consulting Physician (Dermatology)  Required Immunizations needed today:  Declines shingrix as never had chicken pox- told him that he could still get vaccine but he declines Immunization History  Administered Date(s) Administered  . Influenza Split 09/20/2012  . Influenza Whole 12/19/2005, 09/30/2009, 09/10/2010  . Influenza, High Dose Seasonal PF 09/12/2017, 09/24/2018, 08/20/2019, 09/10/2020  .  Influenza,inj,Quad PF,6+ Mos 09/15/2014  . Influenza,inj,Quad PF,6-35 Mos 09/18/2013  . Influenza-Unspecified 09/25/2015, 10/04/2016, 09/12/2017  . PFIZER SARS-COV-2 Vaccination 12/31/2019, 01/20/2020, 09/14/2020  . Pneumococcal Conjugate-13 03/19/2015  . Pneumococcal Polysaccharide-23 09/18/2004  . Td 11/01/2010   Health Maintenance  Topic Date Due  . Hemoglobin A1C  09/25/2020  . Urine Protein Check  09/25/2020  . Tetanus Vaccine  11/01/2020  . Eye exam for diabetics  11/11/2020  . Complete foot exam   09/28/2021  . Flu  Shot  Completed  . COVID-19 Vaccine  Completed  . Pneumonia vaccines  Completed    Screening tests-  Health Maintenance Due  Topic Date Due  . HEMOGLOBIN A1C  09/25/2020  . URINE MICROALBUMIN  09/25/2020   1. Colon cancer screening- past age based screening 2. Lung Cancer screening- not a candidate 3. Skin cancer screening- Dr. Martinique yearly 4. Prostate cancer screening- followed by urology- [facility] has opted to monitor even with elevated PSA Lab Results  Component Value Date   PSA 19.20 (H) 10/22/2018   PSA 19.2 10/22/2018   PSA 1.00 04/11/2011   The following were reviewed and entered/updated in epic if appropriate: Past Medical History:  Diagnosis Date  . Arthritis   . BPH (benign prostatic hypertrophy) with urinary obstruction   . Cancer ([place])    urethral   . CKD (chronic kidney disease), stage III ([place])   . Diet-controlled type 2 diabetes mellitus ([place])   . Duodenitis    peptic  . Hiatal hernia   . History of cellulitis    2012-- left lower extremitiy  . History of urinary tract part removal    2008--  partial urethrectomy for SCC  . Hyperlipidemia   . Hypertension   . Ingrown toenail 10/14/2018   Left big toe s/p removal  . Organic impotence   . OSA on CPAP    mild to moderate per study 04-19-2011  . Penile lesion   . Sleep apnea    CPAP use   Patient Active Problem List   Diagnosis Date Noted  . Mild aortic stenosis 10/25/2018    Priority: High  . Type II diabetes mellitus with stage 4 chronic kidney disease ([place]) 09/15/2014    Priority: High  . CKD (chronic kidney disease), stage IV ([place]) 03/24/2008    Priority: High  . Dysphagia 10/30/2017    Priority: Medium  . Gout 05/24/2016    Priority: Medium  . Erectile dysfunction 09/15/2014    Priority: Medium  . 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 Hyperlipidemia associated with type 2 diabetes mellitus ([place]) 08/27/2007    Priority: Medium  . Hypertension associated with  diabetes ([place]) 07/03/2007    Priority: Medium  . Sleep apnea 07/03/2007    Priority: Medium  . Former smoker 09/15/2014    Priority: Low  . Morbid obesity ([place]) 10/10/2012    Priority: Low  . Osteoarthritis 07/03/2007    Priority: Low  . COVID-19 virus infection 06/20/2019  . Generalized weakness 06/19/2019  . Syncope 05/23/2017   Past Surgical History:  Procedure Laterality Date  . CYSTO/  BILATERAL RETROGRADE PYELOGRAM/  RESECTION PROSTATIC URETHRAL TUMOR/  EXCISION BIOPSY URETHRAL MEATUS AND MEATOTOMY/  TRANSRECTAL ULTRASOUND PROSTATE BIOPSY'S  12-05-2006  . CYSTOSCOPY N/A 06/18/2015   Procedure: CYSTOSCOPY FLEXIBLE;  Surgeon: Irine Seal, MD;  [facility];  Service: Urology;  Laterality: N/A;  . PENILE BIOPSY N/A 06/18/2015   Procedure: PENILE BIOPSY;  Surgeon: Irine Seal,  MD;  Location: [facility];  Service: Urology;  Laterality: N/A;  . URETHRECTOMY  05-01-2007   Partial  (squamous cell carcinoma )    Family History  Problem Relation Age of Onset  . Heart failure Father        CHF, no history MI  . Colon cancer Neg Hx   . Stomach cancer Neg Hx   . Esophageal cancer Neg Hx     Medications- reviewed and updated Current Outpatient Medications  Medication Sig Dispense Refill  . amLODipine (NORVASC) 5 MG tablet Take 1 tablet (5 mg total) by mouth at bedtime. 90 tablet 3  . atorvastatin (LIPITOR) 40 MG tablet Take 1 tablet (40 mg total) by mouth at bedtime. 90 tablet 3  . OVER THE COUNTER MEDICATION Take 1 tablet by mouth at bedtime as needed (sleep). [employer] brand sleep aid    . pantoprazole (PROTONIX) 40 MG tablet Take 1 tablet (40 mg total) by mouth 2 (two) times daily. 180 tablet 3   No current facility-administered medications for this visit.    Allergies-reviewed and updated No Known Allergies  Social History   Socioeconomic History  . Marital status: Married    Spouse name: Not on file  . Number of children: Not on file    . Years of education: Not on file  . Highest education level: Not on file  Occupational History  . Not on file  Tobacco Use  . Smoking status: Former Smoker    Packs/day: 1.00    Years: 25.00    Pack years: 25.00    Types: Cigarettes    Quit date: 12/19/1968    Years since quitting: 51.8  . Smokeless tobacco: Never Used  Vaping Use  . Vaping Use: Never used  Substance and Sexual Activity  . Alcohol use: No    Alcohol/week: 0.0 standard drinks  . Drug use: No  . Sexual activity: Not Currently  Other Topics Concern  . Not on file  Social History Narrative   Married 1999. 5 kids from previous marriage. 2 step kids. 19 grandkids. 9 greatgrandchildren.   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       Wife-hcpoa and this document details wishes. Patient does not want long term life preserving measures but would want resuscitation and intubation if needed.    Social Determinants of Health   Financial Resource Strain:   . Difficulty of Paying Living Expenses: Not on file  Food Insecurity:   . Worried About Charity fundraiser in the Last Year: Not on file  . Ran Out of Food in the Last Year: Not on file  Transportation Needs:   . Lack of Transportation (Medical): Not on file  . Lack of Transportation (Non-Medical): Not on file  Physical Activity:   . Days of Exercise per Week: Not on file  . Minutes of Exercise per Session: Not on file  Stress:   . Feeling of Stress : Not on file  Social Connections:   . Frequency of Communication with Friends and Family: Not on file  . Frequency of Social Gatherings with Friends and Family: Not on file  . Attends Religious Services: Not on file  . Active Member of Clubs or Organizations: Not on file  . Attends Archivist Meetings:  Not on file  . Marital Status: Not on file       Objective:  BP 118/70   Pulse 71   Temp 97.8 F (36.6 C) (Temporal)   Resp 18   Ht 6\' 2"  (1.88 m)   Wt 276 lb 12.8 oz (125.6 kg)   SpO2 98%   BMI 35.54 kg/m  Gen: NAD, resting comfortably   Assessment/Plan:  AWV completed 1. Educated, counseled and referred based on above elements 2. Educated, counseled and referred as appropriate for preventative needs 3. Discussed and documented a written plan for preventiative services and screenings with personalized health advice- After Visit Summary was given to patient which included this plan   Status of chronic or acute concerns  See problem oriented note  Recommended follow up: Return in about 6 months (around 03/29/2021) for follow up- or sooner if needed. 1 year awv Future Appointments  Date Time Provider [place]  10/01/2020  3:00 PM [facility] [place] 5 [facility]    Lab/Order associations:   ICD-10-CM   1. Preventative health care  Z00.00    Return precautions advised. Garret Reddish, MD

## 2020-09-28 NOTE — Patient Instructions (Addendum)
Health Maintenance Due  Topic Date Due  . HEMOGLOBIN A1C - we will see if Dr. Hollie Salk tested this 09/25/2020  . URINE MICROALBUMIN - we will see if Dr. Hollie Salk tested this 09/25/2020    We will see if we can get a copy of labs and save you a stick today- if any really important labs we need to add we will call you back for these  Mr. Word , Thank you for taking time to come for your Medicare Wellness Visit. I appreciate your ongoing commitment to your health goals. Please review the following plan we discussed and let me know if I can assist you in the future.   These are the goals we discussed: Goals    . Weight < 260 lb (117.935 kg)     Cut back on portions;   Controllable  risk for heart disease reviewed for goal setting: Includes; BP control <140/80; monitoring renal disease; obesity (BMI >30); sedentary lifestyle Educated regarding Metabolic syndrome / Excess weight around the waist;  Waist circumference for Men >40  Triglycerides > 150 (60) HDL < 50 (49.9) BP > 130/85  Glucose > 100 Plan Lifestyle changes  Heart Healthy Diet; less sugar  Mediterranean Diet: eating primarily plant-based food such as fruits and vegetables, whole grains, legumes and nuts; replacing butter with healthy fats such as olive oil and canola oil Using herbs and spices instead of salt to flavor food Limiting red meat to no more than a few times a month Eating fish and poultry at least 2 times a week Getting plenty of exercise         This is a list of the screening recommended for you and due dates:  Health Maintenance  Topic Date Due  . Hemoglobin A1C  09/25/2020  . Urine Protein Check  09/25/2020  . Tetanus Vaccine  11/01/2020  . Eye exam for diabetics  11/11/2020  . Complete foot exam   09/28/2021  . Flu Shot  Completed  . COVID-19 Vaccine  Completed  . Pneumonia vaccines  Completed      Recommended follow up: Return in about 6 months (around 03/29/2021) for follow up- or sooner if  needed.

## 2020-10-01 ENCOUNTER — Encounter: Payer: Self-pay | Admitting: Family Medicine

## 2020-10-01 ENCOUNTER — Ambulatory Visit
Admission: RE | Admit: 2020-10-01 | Discharge: 2020-10-01 | Disposition: A | Discharge status: Home / Self Care | Payer: Medicare Other | Source: Ambulatory Visit | Attending: Nephrology | Admitting: Nephrology

## 2020-10-01 DIAGNOSIS — M25471 Effusion, right ankle: Secondary | ICD-10-CM | POA: Diagnosis not present

## 2020-10-01 DIAGNOSIS — N184 Chronic kidney disease, stage 4 (severe): Secondary | ICD-10-CM

## 2020-10-01 DIAGNOSIS — R600 Localized edema: Secondary | ICD-10-CM | POA: Diagnosis not present

## 2020-10-02 ENCOUNTER — Other Ambulatory Visit: Payer: Self-pay

## 2020-10-02 MED ORDER — ROSUVASTATIN CALCIUM 40 MG PO TABS: 40.0000 mg | ORAL_TABLET | Freq: Every day | ORAL | 3 refills | Status: DC

## 2020-10-06 ENCOUNTER — Other Ambulatory Visit: Payer: Self-pay

## 2020-10-06 MED ORDER — ROSUVASTATIN CALCIUM 40 MG PO TABS
40.0000 mg | ORAL_TABLET | Freq: Every day | ORAL | 3 refills | Status: DC
Start: 2020-10-06 — End: 2021-09-21

## 2020-11-17 DIAGNOSIS — Z961 Presence of intraocular lens: Secondary | ICD-10-CM | POA: Diagnosis not present

## 2020-11-17 DIAGNOSIS — H5461 Unqualified visual loss, right eye, normal vision left eye: Secondary | ICD-10-CM | POA: Diagnosis not present

## 2020-11-17 DIAGNOSIS — H01009 Unspecified blepharitis unspecified eye, unspecified eyelid: Secondary | ICD-10-CM | POA: Diagnosis not present

## 2020-11-17 DIAGNOSIS — E119 Type 2 diabetes mellitus without complications: Secondary | ICD-10-CM | POA: Diagnosis not present

## 2020-11-23 IMAGING — US US EXTREM LOW VENOUS
1 series · 13 of 24 positions shown · non-contrast
Comparison: None.

CLINICAL DATA: Bilateral lower extremity edema. History of
malignancy. Evaluate for DVT.



[Series 1: us extrem low venous · 0.06mm/px · 13 of 82 slices shown]
[im 1/82]
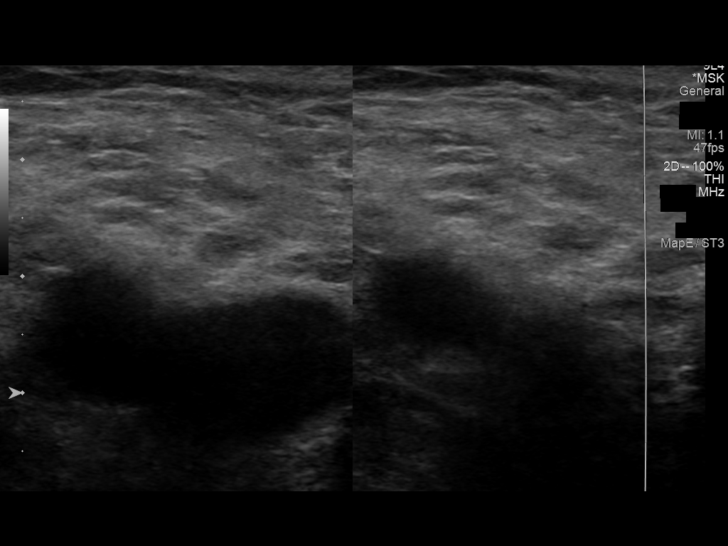
[im 8/82]
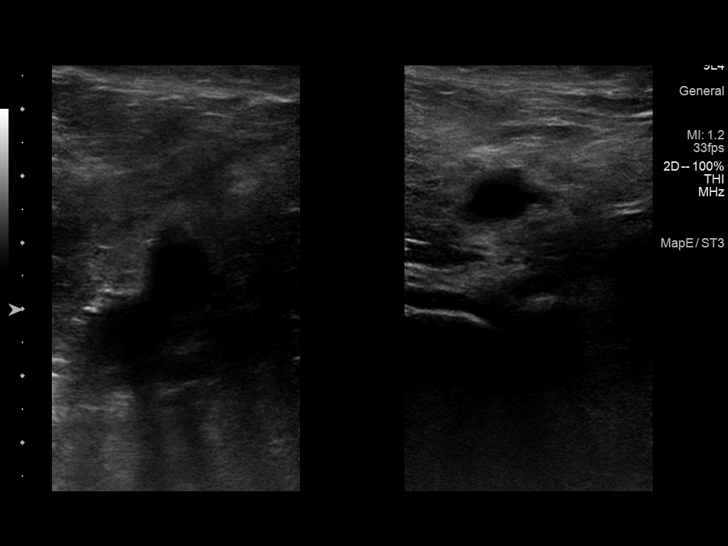
[im 15/82]
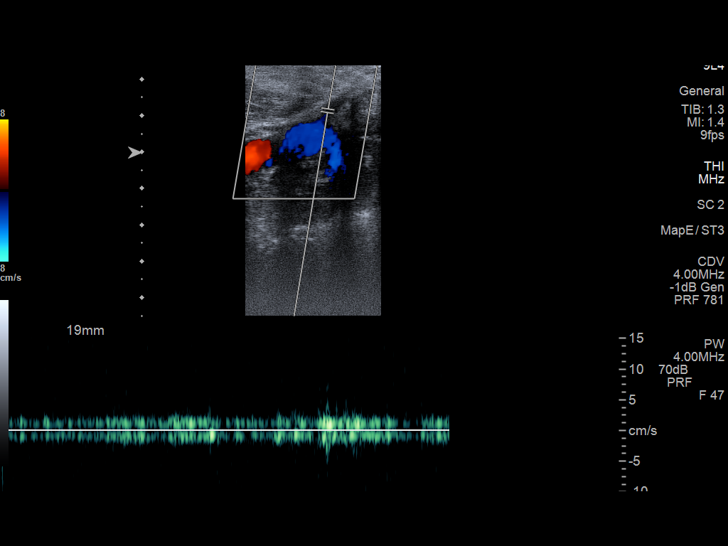
[im 22/82]
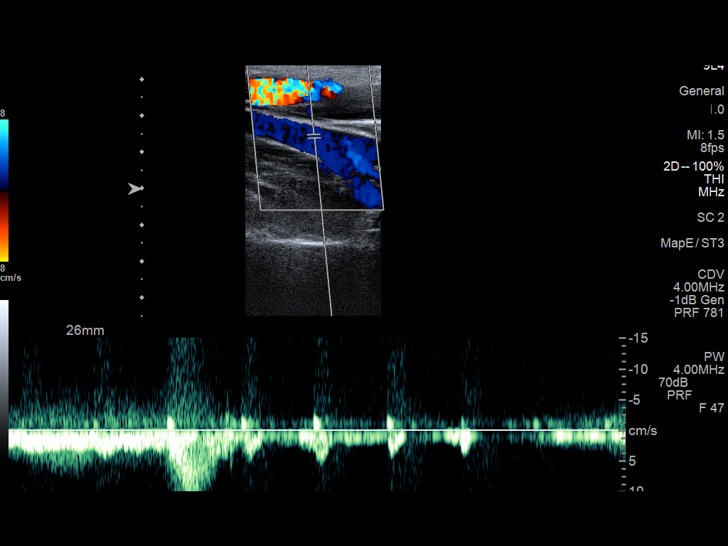
[im 29/82]
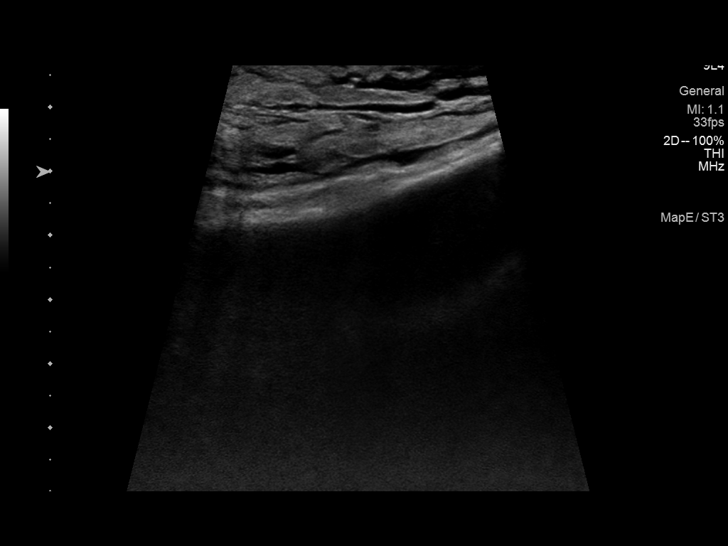
[im 36/82]
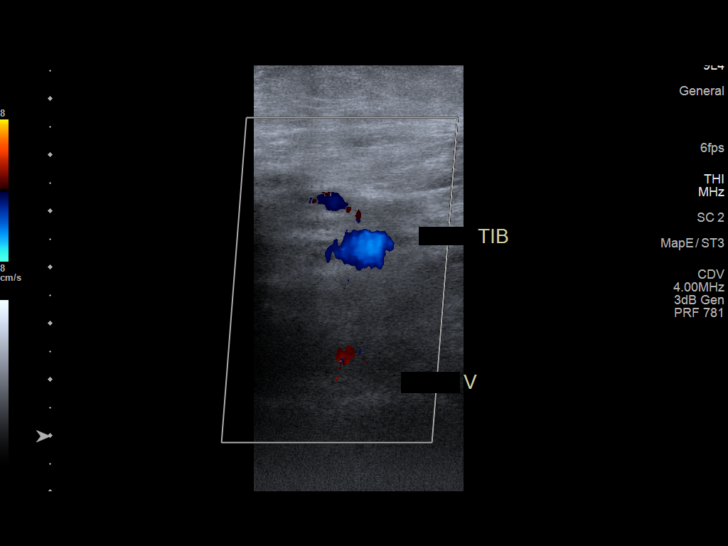
[im 43/82]
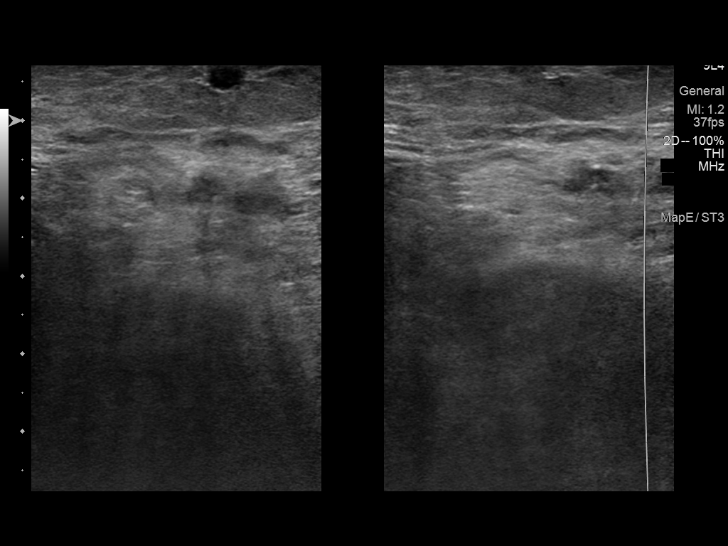
[im 46/82]
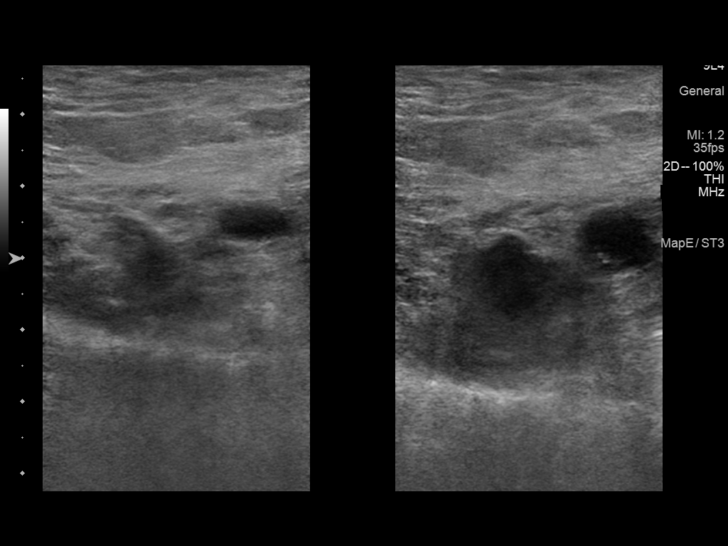
[im 53/82]
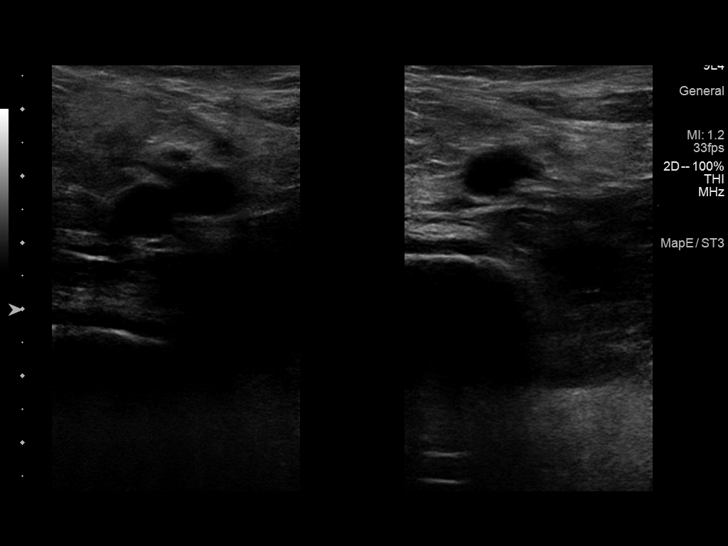
[im 60/82]
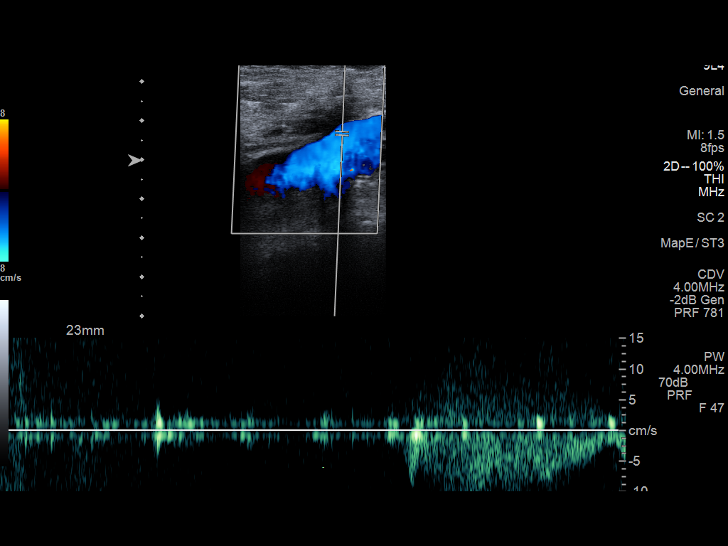
[im 67/82]
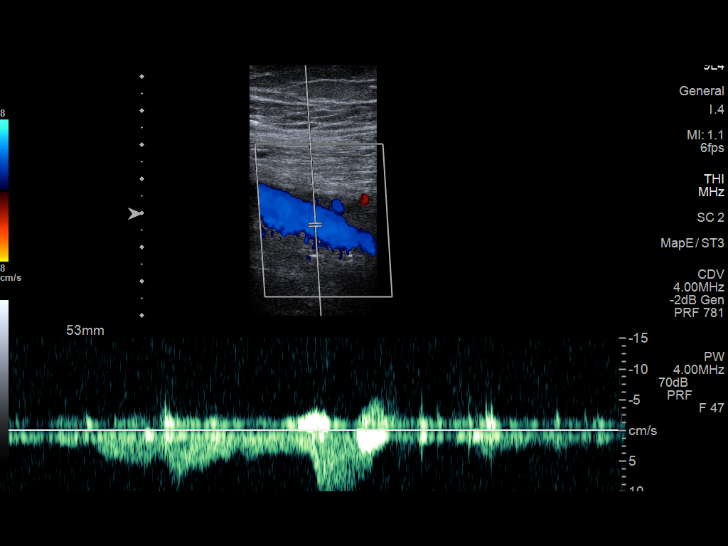
[im 74/82]
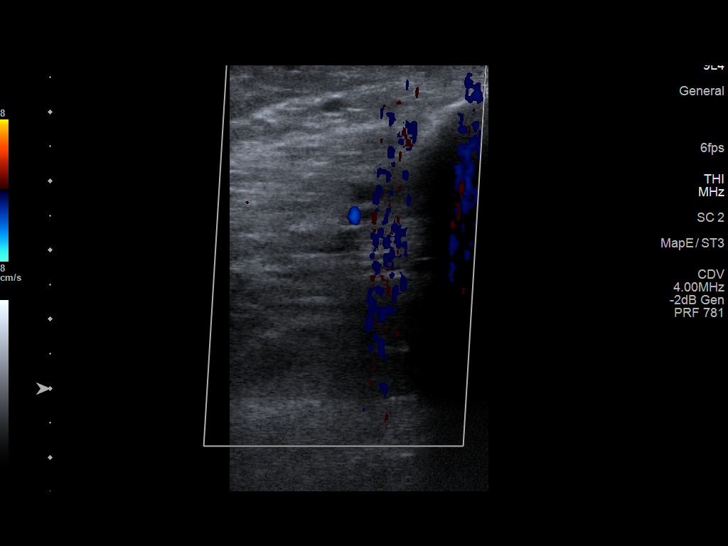
[im 82/82]
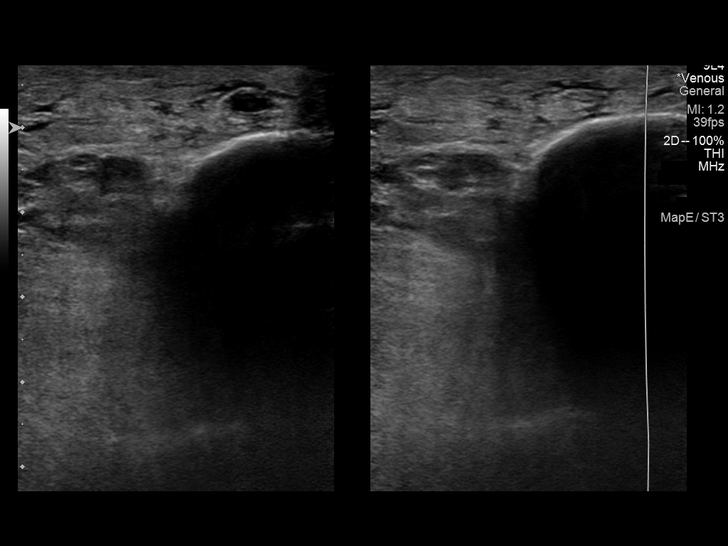

[13 of 24 positions shown; findings below may reference images not displayed]

FINDINGS: RIGHT LOWER EXTREMITY

Common Femoral Vein: No evidence of thrombus. Normal
compressibility, respiratory phasicity and response to augmentation.

Saphenofemoral Junction: No evidence of thrombus. Normal
compressibility and flow on color Doppler imaging.

Profunda Femoral Vein: No evidence of thrombus. Normal
compressibility and flow on color Doppler imaging.

Femoral Vein: No evidence of thrombus. Normal compressibility,
respiratory phasicity and response to augmentation.

Popliteal Vein: No evidence of thrombus. Normal compressibility,
respiratory phasicity and response to augmentation.

Calf Veins: Appear patent where imaged.

Superficial Great Saphenous Vein: No evidence of thrombus. Normal
compressibility.

Venous Reflux:  None.

Other Findings: Minimal to moderate amount of subcutaneous edema is
seen at the level of the right ankle.

LEFT LOWER EXTREMITY

Common Femoral Vein: No evidence of thrombus. Normal
compressibility, respiratory phasicity and response to augmentation.

Saphenofemoral Junction: No evidence of thrombus. Normal
compressibility and flow on color Doppler imaging.

Profunda Femoral Vein: No evidence of thrombus. Normal
compressibility and flow on color Doppler imaging.

Femoral Vein: No evidence of thrombus. Normal compressibility,
respiratory phasicity and response to augmentation.

Popliteal Vein: No evidence of thrombus. Normal compressibility,
respiratory phasicity and response to augmentation.

Calf Veins: Appear patent where imaged.

Superficial Great Saphenous Vein: No evidence of thrombus. Normal
compressibility.

Venous Reflux:  None.

Other Findings: Minimal to moderate amount of subcutaneous edema is
seen at the level of the left ankle.
IMPRESSION: 1. No evidence of DVT within either lower extremity.
2. Minimal to moderate amount of subcutaneous edema is seen at the
level of the lower legs bilaterally.

## 2020-12-08 DIAGNOSIS — H5461 Unqualified visual loss, right eye, normal vision left eye: Secondary | ICD-10-CM | POA: Diagnosis not present

## 2021-01-08 ENCOUNTER — Other Ambulatory Visit: Payer: Self-pay

## 2021-01-08 MED ORDER — COLCHICINE 0.6 MG PO TABS: ORAL_TABLET | ORAL | 0 refills | Status: DC

## 2021-02-17 DIAGNOSIS — D649 Anemia, unspecified: Secondary | ICD-10-CM | POA: Diagnosis not present

## 2021-02-17 DIAGNOSIS — C680 Malignant neoplasm of urethra: Secondary | ICD-10-CM | POA: Diagnosis not present

## 2021-02-17 DIAGNOSIS — C609 Malignant neoplasm of penis, unspecified: Secondary | ICD-10-CM | POA: Diagnosis not present

## 2021-02-17 DIAGNOSIS — R590 Localized enlarged lymph nodes: Secondary | ICD-10-CM | POA: Diagnosis not present

## 2021-02-17 DIAGNOSIS — Z87891 Personal history of nicotine dependence: Secondary | ICD-10-CM | POA: Diagnosis not present

## 2021-02-17 DIAGNOSIS — Z08 Encounter for follow-up examination after completed treatment for malignant neoplasm: Secondary | ICD-10-CM | POA: Diagnosis not present

## 2021-02-17 DIAGNOSIS — Z8549 Personal history of malignant neoplasm of other male genital organs: Secondary | ICD-10-CM | POA: Diagnosis not present

## 2021-02-17 DIAGNOSIS — Z8554 Personal history of malignant neoplasm of ureter: Secondary | ICD-10-CM | POA: Diagnosis not present

## 2021-02-17 DIAGNOSIS — Z906 Acquired absence of other parts of urinary tract: Secondary | ICD-10-CM | POA: Diagnosis not present

## 2021-03-11 DIAGNOSIS — I129 Hypertensive chronic kidney disease with stage 1 through stage 4 chronic kidney disease, or unspecified chronic kidney disease: Secondary | ICD-10-CM | POA: Diagnosis not present

## 2021-03-11 DIAGNOSIS — N184 Chronic kidney disease, stage 4 (severe): Secondary | ICD-10-CM | POA: Diagnosis not present

## 2021-03-11 DIAGNOSIS — R5383 Other fatigue: Secondary | ICD-10-CM | POA: Diagnosis not present

## 2021-03-11 DIAGNOSIS — N2581 Secondary hyperparathyroidism of renal origin: Secondary | ICD-10-CM | POA: Diagnosis not present

## 2021-03-11 DIAGNOSIS — N189 Chronic kidney disease, unspecified: Secondary | ICD-10-CM | POA: Diagnosis not present

## 2021-03-11 DIAGNOSIS — E785 Hyperlipidemia, unspecified: Secondary | ICD-10-CM | POA: Diagnosis not present

## 2021-03-11 DIAGNOSIS — E1122 Type 2 diabetes mellitus with diabetic chronic kidney disease: Secondary | ICD-10-CM | POA: Diagnosis not present

## 2021-03-11 DIAGNOSIS — E1129 Type 2 diabetes mellitus with other diabetic kidney complication: Secondary | ICD-10-CM | POA: Diagnosis not present

## 2021-03-11 DIAGNOSIS — D631 Anemia in chronic kidney disease: Secondary | ICD-10-CM | POA: Diagnosis not present

## 2021-03-11 LAB — IRON,TIBC AND FERRITIN PANEL
Ferritin: 601
Iron: 43

## 2021-03-11 LAB — BASIC METABOLIC PANEL
BUN: 38 — AB (ref 4–21)
CO2: 21 (ref 13–22)
Chloride: 110 — AB (ref 99–108)
Creatinine: 2.6 — AB (ref 0.6–1.3)
Glucose: 142
Sodium: 143 (ref 137–147)

## 2021-03-11 LAB — CBC AND DIFFERENTIAL
HCT: 43 (ref 41–53)
Hemoglobin: 14.2 (ref 13.5–17.5)
Neutrophils Absolute: 8.5
Platelets: 189 (ref 150–399)
WBC: 11.5

## 2021-03-11 LAB — COMPREHENSIVE METABOLIC PANEL
Albumin: 4.3 (ref 3.5–5.0)
Calcium: 9.8 (ref 8.7–10.7)
Globulin: 2.1

## 2021-03-11 LAB — TSH: TSH: 2.57 (ref 0.41–5.90)

## 2021-03-11 LAB — HEPATIC FUNCTION PANEL
ALT: 19 (ref 10–40)
AST: 19 (ref 14–40)
Alkaline Phosphatase: 97 (ref 25–125)

## 2021-03-11 LAB — CBC: RBC: 4.76 (ref 3.87–5.11)

## 2021-03-11 LAB — LIPID PANEL
Cholesterol: 169 (ref 0–200)
HDL: 53 (ref 35–70)
LDL Cholesterol: 94
Triglycerides: 123 (ref 40–160)

## 2021-03-11 LAB — HEMOGLOBIN A1C: Hemoglobin A1C: 6.1

## 2021-03-17 ENCOUNTER — Encounter: Payer: Self-pay | Admitting: Family Medicine

## 2021-03-19 ENCOUNTER — Telehealth: Payer: Self-pay | Admitting: Family Medicine

## 2021-03-19 NOTE — Chronic Care Management (AMB) (Signed)
  Chronic Care Management   Note  03/19/2021 Name: Charles Castillo. MRN: [id number] DOB: Apr 28, 1934  [name]. is a 87 y.o. year old male who is a primary care patient of Marin Olp, MD. I reached out to Charles Castillo. by phone today in response to a referral sent by Mr. BILAL MANZER Jr.'s PCP, Yong Channel Brayton Mars, MD.   Mr. Stidham was given information about Chronic Care Management services today including:  1. CCM service includes personalized support from designated clinical staff supervised by his physician, including individualized plan of care and coordination with other care providers 2. 24/7 contact phone numbers for assistance for urgent and routine care needs. 3. Service will only be billed when office clinical staff spend 20 minutes or more in a month to coordinate care. 4. Only one practitioner may furnish and bill the service in a calendar month. 5. The patient may stop CCM services at any time (effective at the end of the month) by phone call to the office staff.   Patient agreed to services and verbal consent obtained.   Follow up plan:   Lauretta Grill Up[employer] Scheduler

## 2021-03-25 NOTE — Progress Notes (Signed)
Phone [phone] In person visit   Subjective:   Charles Castillo. is a 87 y.o. year old very pleasant male patient who presents for/with See problem oriented charting Chief Complaint  Patient presents with  . Hyperlipidemia  . Diabetes   This visit occurred during the SARS-CoV-2 public health emergency.  Safety protocols were in place, including screening questions prior to the visit, additional usage of staff PPE, and extensive cleaning of exam room while observing appropriate contact time as indicated for disinfecting solutions.   Past Medical History-  Patient Active Problem List   Diagnosis Date Noted  . Mild aortic stenosis 10/25/2018    Priority: High  . Type II diabetes mellitus with stage 4 chronic kidney disease ([place]) 09/15/2014    Priority: High  . CKD (chronic kidney disease), stage IV ([place]) 03/24/2008    Priority: High  . Dysphagia 10/30/2017    Priority: Medium  . Gout 05/24/2016    Priority: Medium  . Erectile dysfunction 09/15/2014    Priority: Medium  . 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 Hyperlipidemia associated with type 2 diabetes mellitus ([place]) 08/27/2007    Priority: Medium  . Hypertension associated with diabetes ([place]) 07/03/2007    Priority: Medium  . Sleep apnea 07/03/2007    Priority: Medium  . Former smoker 09/15/2014    Priority: Low  . Morbid obesity ([place]) 10/10/2012    Priority: Low  . Osteoarthritis 07/03/2007    Priority: Low  . COVID-19 virus infection 06/20/2019  . Generalized weakness 06/19/2019  . Syncope 05/23/2017    Medications- reviewed and updated Current Outpatient Medications  Medication Sig Dispense Refill  . amLODipine (NORVASC) 5 MG tablet Take 1 tablet (5 mg total) by mouth at bedtime. 90 tablet 3  . OVER THE COUNTER MEDICATION Take 1 tablet by mouth at bedtime as needed (sleep). [employer] brand sleep aid    . pantoprazole (PROTONIX) 40 MG tablet Take 1 tablet (40 mg total) by  mouth 2 (two) times daily. 180 tablet 3  . rosuvastatin (CRESTOR) 40 MG tablet Take 1 tablet (40 mg total) by mouth daily. 90 tablet 3  . colchicine 0.6 MG tablet Take 2 tablets by mouth at first sign of gout flare and may repeat 1 tablet in 1 hour. Due to kidney disease do not take again for at least 2 weeks. 30 tablet 0   No current facility-administered medications for this visit.     Objective:  BP 118/70   Pulse 80   Temp 97.9 F (36.6 C) (Temporal)   Ht 6\' 2"  (1.88 m)   Wt 274 lb 3.2 oz (124.4 kg)   SpO2 96%   BMI 35.21 kg/m  Gen: NAD, resting comfortably CV: RRR faint murmur Lungs: CTAB no crackles, wheeze, rhonchi Abdomen: soft/nontender/nondistended/normal bowel sounds.  Ext: trace edema Skin: warm, dry     Assessment and Plan    #Hypertension/CKD IV S: Compliant with amlodipine 5 mg  He follows with Dr. Hollie Salk of nephrology.  No microalbuminuria and nephrology has not recommended ACE inhibitor or arb BP Readings from Last 3 Encounters:  03/29/21 118/70  09/28/20 118/70  04/14/20 124/62  A/P: bp stable. Creatinine stable when checked recently. We will get microalbumin/cr ratio as well.    #Hyperlipidemia S: Patient is compliant with atorvastatin 40 mg--> LDL above 100 increase to rosuvastatin 40 mg.  Prior was on aspirin but we took him off of this for primary prevention. Lab Results  Component Value Date   CHOL 169 03/11/2021   HDL 53 03/11/2021   LDLCALC 94 03/11/2021   LDLDIRECT 123.0 10/12/2018   TRIG 123 03/11/2021   CHOLHDL 4 04/11/2019  A/P: Improved controlled when checked this few weeks ago-continue current medicines   #Diabetes S: Has been diet controlled on recent checks Lab Results  Component Value Date   HGBA1C 6.1 03/11/2021  A/P: Well-controlled on last check-update A1c with labs today  #Mild aortic stenosis S: Last echocardiogram was June 23, 2019 A/P: Asymptomatic.  Discussed repeating every 2 to 3 years-we will order next visit   %  Urethral carcinoma-patient follows with [facility] Dr. Dimas Millin.History of urethrectomy and bilateral lymph node dissection. Clean bill of health 2-3 weeks ago.   # GERD/dysphagia S:on high dose PPI twice daily per DR. Pyrtle - down to one dose a day A/P:  Doing well on just once a day- continue current medicine  #Gout S: cr cl right at 30 Lab Results  Component Value Date   LABURIC 8.2 (H) 05/24/2016  A/P:worked well- patient wants to keep this but will adjust for renal function    Recommended follow up: No follow-ups on file. Future Appointments  Date Time Provider [place]  03/30/2021  9:00 AM [facility]CM PHARMACIST [facility]    Lab/Order associations:   ICD-10-CM   1. Hypertension associated with diabetes ([place])  E11.59    I15.2   2. Type 2 diabetes mellitus with stage 4 chronic kidney disease, without long-term current use of insulin ([facility])  E11.22 Microalbumin / creatinine urine ratio   N18.4   3. Hyperlipidemia associated with type 2 diabetes mellitus ([place])  E11.69    E78.5   4. CKD (chronic kidney disease), stage IV ([facility])  N18.4   5. Chronic gout without tophus, unspecified cause, unspecified site  [id number]   6. Generalized weakness  R53.1    Return precautions advised.  Garret Reddish, MD

## 2021-03-25 NOTE — Patient Instructions (Addendum)
Health Maintenance Due  Topic Date Due  . URINE MICROALBUMIN Please stop by lab before you go If you have mychart- we will send your results within 3 business days of [place] receiving them.  If you do not have mychart- we will call you about results within 5 business days of [place] receiving them.  *please also note that you will see labs on mychart as soon as they post. I will later go in and write notes on them- will say "notes from Dr. Yong Channel"  09/25/2020  . TETANUS/TDAP - please consider at your pharmacy- will be cheaper there 11/01/2020  . OPHTHALMOLOGY EXAM Will have done in October 11/11/2020  . COVID-19 Vaccine - elgible for 4th shot/2nd booster per CDC- chek with your pharmacy on availability 03/14/2021   No changes today other than adjust dose on colchicine

## 2021-03-26 ENCOUNTER — Telehealth: Payer: Self-pay

## 2021-03-26 NOTE — Chronic Care Management (AMB) (Signed)
    Chronic Care Management Pharmacy Assistant   Name: Charles Castillo.  MRN: [id number] DOB: [birth year]  Charles Castillo. is an 87 y.o. year old male who presents for his initial CCM visit with the clinical pharmacist.  Reason for Encounter: Chart Review for Initial CCM Visit   Conditions to be addressed/monitored: HTN, Aortic stenosis, HLD, DMII, Urethral cancer, CKD Stage IV, Osteoarthritis and Gout.  Primary concerns for visit include: HTN, HLD, DMII, CKD Stage IV   Recent office visits:  n/a  Recent consult visits:  02/17/2021 OV (oncology) Lorine Bears; no further information available.  Hospital visits:  None in previous 6 months  Medications: Outpatient Encounter Medications as of 03/26/2021  Medication Sig  . amLODipine (NORVASC) 5 MG tablet Take 1 tablet (5 mg total) by mouth at bedtime.  . colchicine 0.6 MG tablet Take 2 tablets by mouth at first sign of gout flare and may repeat 1 tablet in 1 hour. Then take 1 tablet once daily until resolved. DO NOT take more than 2 weeks.  Marland Kitchen OVER THE COUNTER MEDICATION Take 1 tablet by mouth at bedtime as needed (sleep). [employer] brand sleep aid  . pantoprazole (PROTONIX) 40 MG tablet Take 1 tablet (40 mg total) by mouth 2 (two) times daily.  . rosuvastatin (CRESTOR) 40 MG tablet Take 1 tablet (40 mg total) by mouth daily.   No facility-administered encounter medications on file as of 03/26/2021.    Medication Fill History: Amlodipine 5 mg last filled 03/10/2021 90 DS Colchicine 0.6 mg last filled 01/08/2021 27 DS Pantoprazole 40 mg last filled 11/04/2020 90 DS   Any changes in your medications or health? Patient states he has not had any changes in his medications or health.  Any side effects from any medications?  Patient states he does not have any side effects from any of his medications.  Do you have any symptoms or problems not managed by your medications? Patient states he does not currently have any symptoms or  problems that are not managed by his medications.  Any concerns about your health right now? Patient states he does not have any major concerns about his health at this time.  Has your provider asked that you check blood pressure, blood sugar, or follow special diet at home? Patient states he does not currently check his blood pressure or blood sugars at home. He states he does not follow any special diet.  Do you get any type of exercise on a regular basis? Patient states "not a whole lot."  Can you think of a goal you would like to reach for your health? Patient states "I would like to continue to live, I want my last years to be comfortable."  Do you have any problems getting your medications? Patient states he does not have any problems getting his medications.  Is there anything that you would like to discuss during the appointment?  Patient stated "No I don't think so."  Please bring medications and supplements to appointment.  Star Rating Drugs: Rosuvastatin 40 mg last filled 12/23/2020 90 DS  April D Calhoun, [place] Pharmacist Assistant [phone]

## 2021-03-26 NOTE — Progress Notes (Signed)
Chronic Care Management Pharmacy Note  03/30/2021 Name:  Charles Castillo. MRN:  [id number] DOB:  [birth year]  Subjective: Charles Castillo. is an 87 y.o. year old male who is a primary patient of Hunter, Brayton Mars, MD.  The CCM team was consulted for assistance with disease management and care coordination needs.    Engaged with patient by telephone for initial visit in response to provider referral for pharmacy case management and/or care coordination services.   Consent to Services:  The patient was given the following information about Chronic Care Management services today, agreed to services, and gave verbal consent: 1. CCM service includes personalized support from designated clinical staff supervised by the primary care provider, including individualized plan of care and coordination with other care providers 2. 24/7 contact phone numbers for assistance for urgent and routine care needs. 3. Service will only be billed when office clinical staff spend 20 minutes or more in a month to coordinate care. 4. Only one practitioner may furnish and bill the service in a calendar month. 5.The patient may stop CCM services at any time (effective at the end of the month) by phone call to the office staff. 6. The patient will be responsible for cost sharing (co-pay) of up to 20% of the service fee (after annual deductible is met). Patient agreed to services and consent obtained.  Patient Care Team: Marin Olp, MD as PCP - General (Family Medicine) Madelon Lips, MD as Consulting Physician (Nephrology) Janalyn Harder, MD (Urology) Pyrtle, Lajuan Lines, MD as Consulting Physician (Gastroenterology) Irine Seal, MD as Attending Physician (Urology) Martinique, Amy, MD as Consulting Physician (Dermatology) Madelin Rear, [facility] as Pharmacist (Pharmacist)  Recent office visits: 03/29/2021 (PCP): Very slight elevation in microalbumin to creatinine ratio. Please forward a copy to Dr. Hollie Salk with  nephrology. We primarily did this for screening metrics-at his level of kidney disease I do not think we would start an ACE -I r an ARB medication  Hospital visits: None in previous 6 months  Objective:  Lab Results  Component Value Date   CREATININE 2.6 (A) 03/11/2021   CREATININE 2.4 (A) 09/17/2020   CREATININE 2.37 (H) 09/26/2019   CMP     Component Value Date/Time   NA 143 03/11/2021 0000   K 4.7 09/17/2020 0000   CL 110 (A) 03/11/2021 0000   CO2 21 03/11/2021 0000   GLUCOSE 127 (H) 09/26/2019 1106   GLUCOSE 119 (H) 10/31/2006 1019   BUN 38 (A) 03/11/2021 0000   CREATININE 2.6 (A) 03/11/2021 0000   CREATININE 2.37 (H) 09/26/2019 1106   CALCIUM 9.8 03/11/2021 0000   PROT 6.8 09/26/2019 1106   ALBUMIN 4.3 03/11/2021 0000   AST 19 03/11/2021 0000   ALT 19 03/11/2021 0000   ALKPHOS 97 03/11/2021 0000   BILITOT 0.6 09/26/2019 1106   GFRNONAA 24 09/17/2020 0000   GFRAA 28 09/17/2020 0000   Lab Results  Component Value Date   HGBA1C 6.1 03/11/2021   Last diabetic Eye exam:  Lab Results  Component Value Date/Time   HMDIABEYEEXA No Retinopathy 11/12/2019 12:00 AM    Last diabetic Foot exam: No results found for: HMDIABFOOTEX      Component Value Date/Time   CHOL 169 03/11/2021 0000   TRIG 123 03/11/2021 0000   TRIG 84 10/31/2006 1019   HDL 53 03/11/2021 0000   CHOLHDL 4 04/11/2019 1004   VLDL 21.4 04/11/2019 1004   LDLCALC 94 03/11/2021 0000   LDLDIRECT 123.0  10/12/2018 0831    Hepatic Function Latest Ref Rng & Units 03/11/2021 09/17/2020 09/26/2019  Total Protein 6.0 - 8.3 g/dL - - 6.8  Albumin 3.5 - 5.0 4.3 4.2 4.0  AST 14 - 40 19 17 19   ALT 10 - 40 19 23 21   Alk Phosphatase 25 - 125 97 111 91  Total Bilirubin 0.2 - 1.2 mg/dL - - 0.6  Bilirubin, Direct 0.0 - 0.3 mg/dL - - -    Lab Results  Component Value Date/Time   TSH 2.57 03/11/2021 12:00 AM   TSH 1.617 06/19/2019 10:00 PM   TSH 2.40 10/22/2018 12:05 PM   TSH 3.71 03/19/2015 08:53 AM    CBC  Latest Ref Rng & Units 03/11/2021 09/17/2020 06/19/2019  WBC - 11.5 8.6 5.0  Hemoglobin 13.5 - 17.5 14.2 14.7 15.3  Hematocrit 41 - 53 43 44 48.2  Platelets 150 - 399 189 178 162    Lab Results  Component Value Date/Time   VD25OH 16.3 09/17/2020 12:00 AM    Clinical ASCVD: No  The ASCVD Risk score (George Mason., et al., 2013) failed to calculate for the following reasons:   The 2013 ASCVD risk score is only valid for ages 62 to 63    10/01/2020 Vascular US: No evidence of DVT within either lower extremity. Minimal to moderate amount of subcutaneous edema is seen at the level of the lower legs bilaterally.   Social History   Tobacco Use  Smoking Status Former Smoker  . Packs/day: 1.00  . Years: 25.00  . Pack years: 25.00  . Types: Cigarettes  . Quit date: 12/19/1968  . Years since quitting: 52.3  Smokeless Tobacco Never Used   BP Readings from Last 3 Encounters:  03/29/21 118/70  09/28/20 118/70  04/14/20 124/62   Pulse Readings from Last 3 Encounters:  03/29/21 80  09/28/20 71  04/14/20 74   Wt Readings from Last 3 Encounters:  03/29/21 274 lb 3.2 oz (124.4 kg)  09/28/20 276 lb 12.8 oz (125.6 kg)  04/14/20 272 lb (123.4 kg)   Assessment: Review of patient past medical history, allergies, medications, health status, including review of consultants reports, laboratory and other test data, was performed as part of comprehensive evaluation and provision of chronic care management services.   SDOH:  (Social Determinants of Health) assessments and interventions performed: Yes   CCM Care Plan  No Known Allergies  Medications Reviewed Today    Reviewed by Madelin Rear, [facility] (Pharmacist) on 03/30/21 at [phone]  Med List Status: <None>  Medication Order Taking? Sig Documenting Provider Last Dose Status Informant  amLODipine (NORVASC) 5 MG tablet [id number] Yes Take 1 tablet (5 mg total) by mouth at bedtime. Marin Olp, MD Taking Active   colchicine 0.6 MG tablet [id number]  Yes Take 2 tablets by mouth at first sign of gout flare and may repeat 1 tablet in 1 hour. Due to kidney disease do not take again for at least 2 weeks. Marin Olp, MD Taking Active   OVER THE COUNTER MEDICATION [id number] Yes Take 1 tablet by mouth at bedtime as needed (sleep). [employer] brand sleep aid [provider] Taking Active Self  pantoprazole (PROTONIX) 40 MG tablet [id number] Yes Take 1 tablet (40 mg total) by mouth 2 (two) times daily. Jerene Bears, MD Taking Active   rosuvastatin (CRESTOR) 40 MG tablet [id number] Yes Take 1 tablet (40 mg total) by mouth daily. Marin Olp, MD Taking Active  Patient Active Problem List   Diagnosis Date Noted  . COVID-19 virus infection 06/20/2019  . Generalized weakness 06/19/2019  . Mild aortic stenosis 10/25/2018  . Dysphagia 10/30/2017  . Syncope 05/23/2017  . Gout 05/24/2016  . Erectile dysfunction 09/15/2014  . Type II diabetes mellitus with stage 4 chronic kidney disease ([place]) 09/15/2014  . Former smoker 09/15/2014  . Morbid obesity ([place]) 10/10/2012  . 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[place]) 11/04/2009  . CKD (chronic kidney disease), stage IV ([place]) 03/24/2008  . Hyperlipidemia associated with type 2 diabetes mellitus ([place]) 08/27/2007  . Hypertension associated with diabetes ([place]) 07/03/2007  . Osteoarthritis 07/03/2007  . Sleep apnea 07/03/2007    Immunization History  Administered Date(s) Administered  . Influenza Split 09/20/2012  . Influenza Whole 12/19/2005, 09/30/2009, 09/10/2010  . Influenza, High Dose Seasonal PF 09/12/2017, 09/24/2018, 08/20/2019, 09/10/2020  . Influenza,inj,Quad PF,6+ Mos 09/15/2014  . Influenza,inj,Quad PF,6-35 Mos 09/18/2013  . Influenza-Unspecified 09/25/2015, 10/04/2016, 09/12/2017  . PFIZER(Purple Top)SARS-COV-2 Vaccination 12/31/2019, 01/20/2020, 09/14/2020  . Pneumococcal Conjugate-13 03/19/2015  . Pneumococcal Polysaccharide-23 09/18/2004  . Td 11/01/2010     Conditions to be addressed/monitored: Aortic Stenosis, CKD IV, Morbid Obesity, Type II DM, HLD, HTN, Sleep Apnea, ED, Gout   Care Plan : General Pharmacy (Adult)  Updates made by Madelin Rear, [facility] since 03/30/2021 12:00 AM    Problem: Aortic Stenosis, CKD IV, Morbid Obesity, Type II DM, HLD, HTN, Sleep Apnea, ED, Gout   Priority: High    Long-Range Goal: Disease Management   Start Date: 03/30/2021  Expected End Date: 03/30/2022  This Visit's Progress: On track  Priority: High  Note:    Current Barriers:  . Work towards optimizing lifestyle - exercise by walking 2-3x/wk.  . No medication related concerns  Pharmacist Clinical Goal(s):  Marland Kitchen Patient will maintain control of BP HLD DM as evidenced by vitals and labs  through collaboration with PharmD and provider.   Interventions: . 1:1 collaboration with Marin Olp, MD regarding development and update of comprehensive plan of care as evidenced by provider attestation and co-signature . Inter-disciplinary care team collaboration (see longitudinal plan of care) . Comprehensive medication review performed; medication list updated in electronic medical record . No changes to medications  Hypertension (BP goal <130/80) -Controlled -OSA on CPAP; CKD IV - sees Dr Hollie Salk -Current treatment: . Amlodipine 5 mg once daily -Medications previously tried: Lisinopril 10 mg once daily   -Current home readings: n/a -Denies hypotensive/hypertensive symptoms -Educated on BP goals, potential benefits of BP agents and proteinuria/CKD progression. -Counseled to monitor BP at home if directed, document, and provide log at future appointments -Recommended to continue current medication  Hyperlipidemia: (LDL goal < 100) -Controlled -Primary prevention, age >59. Well controlled DM, HTN.  -Current treatment: . Rosuvastatin 40 mg (09/2020) -Medications previously tried: Atorvastatin 40 mg once daily, Simvastatin 40 mg once daily    -Tolerating  current statin without issue -Educated on Cholesterol goals;  -Recommended to continue current medication  Diabetes (A1c goal <6.5%) -Controlled -GFR 20s - CKD IV, MCR >30 at 39.5 (03/29/2021) -a1c and bp at goal and well controlled historically -Current medications: . none . -Current home glucose readings: n/a -Denies hypoglycemic/hyperglycemic symptoms -Current meal patterns: Snacks: minimal. Drinks: coffee, tea. No alcohol. Some soft drinks.  -Current exercise: minimal -Walking 2-3x/wk encouraged as exercise goal -Counseled on diet and exercise extensively  Gout (Goal: minimize symptoms, ensure lifestyle optimization) -Controlled -GFR 20s -Very rarely an issue, maybe once a year -Current treatment  . Colchicine  0.6 mg tablet x 2 at first sign of gout flare, repeat 1 tablet in 1 hours. Avoid additional doses for 2 weeks due to CKD. -Has not been on allopurinol  -Recommended to continue current medication Reviewed potential dietary contributers including sea food, aged meats and cheeses, alcohol  Sleep -Taking a sleep aid from [employer], unable to identify active ingredient. -Typically sleeping well, will get up one time throughout night to urinate. Feels well rested throughout day.  -Current treatment:  Unable to confirm -if sleep agent includes diphenhydramine, would recommend to d/c and consider melatonin.  Denies recent falls, sluggishness with current aid.    Patient Goals/Self-Care Activities . Patient will:  - take medications as prescribed -consider exercise goal of walking 2-3x/wk   Follow Up Plan: [place] f/u call 8-10 months Medication Assistance: None required.     Patient's preferred pharmacy is:  [facility], [place] to [address] [place] [address] Phone: [phone] Fax: [phone]  [id number] [place]Phone: [phone]: [phone] Follow Up:  Patient agrees to Care Plan and Follow-up.  Future Appointments  Date Time Provider [place] 09/30/2021 10:40 AM HMarin Olp MD [facility]  11/29/2021 10:30 AM [facility] CCM PHARMACIST [facility]   JMadelin Rear Pharm.D., BCGP Clinical Pharmacist LJordan([phone]

## 2021-03-29 ENCOUNTER — Other Ambulatory Visit: Payer: Self-pay

## 2021-03-29 ENCOUNTER — Ambulatory Visit (INDEPENDENT_AMBULATORY_CARE_PROVIDER_SITE_OTHER): Payer: Medicare Other | Admitting: Family Medicine

## 2021-03-29 ENCOUNTER — Encounter: Payer: Self-pay | Admitting: Family Medicine

## 2021-03-29 VITALS — BP 118/70 | HR 80 | Temp 97.9°F | Ht 74.0 in | Wt 274.2 lb

## 2021-03-29 DIAGNOSIS — E1159 Type 2 diabetes mellitus with other circulatory complications: Secondary | ICD-10-CM | POA: Diagnosis not present

## 2021-03-29 DIAGNOSIS — N184 Chronic kidney disease, stage 4 (severe): Secondary | ICD-10-CM | POA: Diagnosis not present

## 2021-03-29 DIAGNOSIS — E785 Hyperlipidemia, unspecified: Secondary | ICD-10-CM | POA: Diagnosis not present

## 2021-03-29 DIAGNOSIS — E1122 Type 2 diabetes mellitus with diabetic chronic kidney disease: Secondary | ICD-10-CM | POA: Diagnosis not present

## 2021-03-29 DIAGNOSIS — E1169 Type 2 diabetes mellitus with other specified complication: Secondary | ICD-10-CM | POA: Diagnosis not present

## 2021-03-29 DIAGNOSIS — R531 Weakness: Secondary | ICD-10-CM

## 2021-03-29 DIAGNOSIS — I152 Hypertension secondary to endocrine disorders: Secondary | ICD-10-CM

## 2021-03-29 DIAGNOSIS — M1A9XX0 Chronic gout, unspecified, without tophus (tophi): Secondary | ICD-10-CM

## 2021-03-29 LAB — MICROALBUMIN / CREATININE URINE RATIO
Creatinine,U: 104.2 mg/dL
Microalb Creat Ratio: 39.5 mg/g — ABNORMAL HIGH (ref 0.0–30.0)
Microalb, Ur: 41.2 mg/dL — ABNORMAL HIGH (ref 0.0–1.9)

## 2021-03-29 MED ORDER — COLCHICINE 0.6 MG PO TABS: ORAL_TABLET | ORAL | 0 refills | Status: DC

## 2021-03-30 ENCOUNTER — Ambulatory Visit (INDEPENDENT_AMBULATORY_CARE_PROVIDER_SITE_OTHER): Payer: Medicare Other

## 2021-03-30 DIAGNOSIS — M1A9XX0 Chronic gout, unspecified, without tophus (tophi): Secondary | ICD-10-CM

## 2021-03-30 DIAGNOSIS — E1169 Type 2 diabetes mellitus with other specified complication: Secondary | ICD-10-CM | POA: Diagnosis not present

## 2021-03-30 DIAGNOSIS — I152 Hypertension secondary to endocrine disorders: Secondary | ICD-10-CM | POA: Diagnosis not present

## 2021-03-30 DIAGNOSIS — E1159 Type 2 diabetes mellitus with other circulatory complications: Secondary | ICD-10-CM

## 2021-03-30 DIAGNOSIS — E785 Hyperlipidemia, unspecified: Secondary | ICD-10-CM | POA: Diagnosis not present

## 2021-03-30 DIAGNOSIS — E1122 Type 2 diabetes mellitus with diabetic chronic kidney disease: Secondary | ICD-10-CM | POA: Diagnosis not present

## 2021-03-30 DIAGNOSIS — N184 Chronic kidney disease, stage 4 (severe): Secondary | ICD-10-CM

## 2021-03-30 NOTE — Patient Instructions (Addendum)
Charles Castillo,  Thank you for talking with me today. I have included our care plan/goals in the following pages.   Please review and call me at [phone] with any questions.  Thanks! Ellin Mayhew, Pharm.D., BCGP Clinical Pharmacist [facility] [place] [phone] Patient Care Plan: General Pharmacy (Adult)    Problem Identified: Aortic Stenosis, CKD IV, Morbid Obesity, Type II DM, HLD, HTN, Sleep Apnea, ED, Gout   Priority: High    Long-Range Goal: Disease Management   Start Date: 03/30/2021  Expected End Date: 03/30/2022  This Visit's Progress: On track  Priority: High  Note:    Current Barriers:  . Work towards optimizing lifestyle - exercise by walking 2-3x/wk.  . No medication related concerns  Pharmacist Clinical Goal(s):  Marland Kitchen Patient will maintain control of BP HLD DM as evidenced by vitals and labs  through collaboration with PharmD and provider.   Interventions: . 1:1 collaboration with Marin Olp, MD regarding development and update of comprehensive plan of care as evidenced by provider attestation and co-signature . Inter-disciplinary care team collaboration (see longitudinal plan of care) . Comprehensive medication review performed; medication list updated in electronic medical record . No changes to medications  Hypertension (BP goal <130/80) -Controlled -OSA on CPAP; CKD IV - sees Dr Hollie Salk -Current treatment: . Amlodipine 5 mg once daily -Medications previously tried: Lisinopril 10 mg once daily   -Current home readings: n/a -Denies hypotensive/hypertensive symptoms -Educated on BP goals, potential benefits of BP agents and proteinuria/CKD progression. -Counseled to monitor BP at home if directed, document, and provide log at future appointments -Recommended to continue current medication  Hyperlipidemia: (LDL goal < 100) -Controlled -Primary prevention, age >1. Well controlled DM, HTN.  -Current  treatment: . Rosuvastatin 40 mg (09/2020) -Medications previously tried: Atorvastatin 40 mg once daily, Simvastatin 40 mg once daily    -Tolerating current statin without issue -Educated on Cholesterol goals;  -Recommended to continue current medication  Diabetes (A1c goal <6.5%) -Controlled -GFR 20s - CKD IV, MCR >30 at 39.5 (03/29/2021) -a1c and bp at goal and well controlled historically -Current medications: . none . -Current home glucose readings: n/a -Denies hypoglycemic/hyperglycemic symptoms -Current meal patterns: Snacks: minimal. Drinks: coffee, tea. No alcohol. Some soft drinks.  -Current exercise: minimal -Walking 2-3x/wk encouraged as exercise goal -Counseled on diet and exercise extensively  Gout (Goal: minimize symptoms, ensure lifestyle optimization) -Controlled -GFR 20s -Very rarely an issue, maybe once a year -Current treatment  . Colchicine 0.6 mg tablet x 2 at first sign of gout flare, repeat 1 tablet in 1 hours. Avoid additional doses for 2 weeks due to CKD. -Has not been on allopurinol  -Recommended to continue current medication Reviewed potential dietary contributers including sea food, aged meats and cheeses, alcohol  Sleep -Taking a sleep aid from [employer], unable to identify active ingredient. -Typically sleeping well, will get up one time throughout night to urinate. Feels well rested throughout day.  -Current treatment:  Unable to confirm -if sleep agent includes diphenhydramine, would recommend to d/c and consider melatonin.  Denies recent falls, sluggishness with current aid.    Patient Goals/Self-Care Activities . Patient will:  - take medications as prescribed -consider exercise goal of walking 2-3x/wk   Follow Up Plan: [place] f/u call 8-10 months Medication Assistance: None required.      The patient was given the following information about Chronic Care Management services today, agreed to services, and gave verbal consent: 1.  CCM service  includes personalized support from designated clinical staff supervised by the primary care provider, including individualized plan of care and coordination with other care providers 2. 24/7 contact phone numbers for assistance for urgent and routine care needs. 3. Service will only be billed when office clinical staff spend 20 minutes or more in a month to coordinate care. 4. Only one practitioner may furnish and bill the service in a calendar month. 5.The patient may stop CCM services at any time (effective at the end of the month) by phone call to the office staff. 6. The patient will be responsible for cost sharing (co-pay) of up to 20% of the service fee (after annual deductible is met). Patient agreed to services and consent obtained.  The patient verbalized understanding of instructions provided today and agreed to receive a MyChart copy of patient instruction and/or educational materials. Telephone follow up appointment with pharmacy team member scheduled for: See next appointment with "Care Management Staff" under "What's Next" below.  Diabetes Mellitus and Nutrition, Adult When you have diabetes, or diabetes mellitus, it is very important to have healthy eating habits because your blood sugar (glucose) levels are greatly affected by what you eat and drink. Eating healthy foods in the right amounts, at about the same times every day, can help you:  Control your blood glucose.  Lower your risk of heart disease.  Improve your blood pressure.  Reach or maintain a healthy weight. What can affect my meal plan? Every person with diabetes is different, and each person has different needs for a meal plan. Your health care provider may recommend that you work with a dietitian to make a meal plan that is best for you. Your meal plan may vary depending on factors such as:  The calories you need.  The medicines you take.  Your weight.  Your blood glucose, blood pressure, and cholesterol  levels.  Your activity level.  Other health conditions you have, such as heart or kidney disease. How do carbohydrates affect me? Carbohydrates, also called carbs, affect your blood glucose level more than any other type of food. Eating carbs naturally raises the amount of glucose in your blood. Carb counting is a method for keeping track of how many carbs you eat. Counting carbs is important to keep your blood glucose at a healthy level, especially if you use insulin or take certain oral diabetes medicines. It is important to know how many carbs you can safely have in each meal. This is different for every person. Your dietitian can help you calculate how many carbs you should have at each meal and for each snack. How does alcohol affect me? Alcohol can cause a sudden decrease in blood glucose (hypoglycemia), especially if you use insulin or take certain oral diabetes medicines. Hypoglycemia can be a life-threatening condition. Symptoms of hypoglycemia, such as sleepiness, dizziness, and confusion, are similar to symptoms of having too much alcohol.  Do not drink alcohol if: ? Your health care provider tells you not to drink. ? You are pregnant, may be pregnant, or are planning to become pregnant.  If you drink alcohol: ? Do not drink on an empty stomach. ? Limit how much you use to:  0-1 drink a day for women.  0-2 drinks a day for men. ? Be aware of how much alcohol is in your drink. In the [place] one drink equals one 12 oz bottle of beer (355 mL), one 5 oz glass of wine (148 mL), or one  1 oz glass of hard liquor (44 mL). ? Keep yourself hydrated with water, diet soda, or unsweetened iced tea.  Keep in mind that regular soda, juice, and other mixers may contain a lot of sugar and must be counted as carbs. What are tips for following this plan? Reading food labels  Start by checking the serving size on the "Nutrition Facts" label of packaged foods and drinks. The amount of calories,  carbs, fats, and other nutrients listed on the label is based on one serving of the item. Many items contain more than one serving per package.  Check the total grams (g) of carbs in one serving. You can calculate the number of servings of carbs in one serving by dividing the total carbs by 15. For example, if a food has 30 g of total carbs per serving, it would be equal to 2 servings of carbs.  Check the number of grams (g) of saturated fats and trans fats in one serving. Choose foods that have a low amount or none of these fats.  Check the number of milligrams (mg) of salt (sodium) in one serving. Most people should limit total sodium intake to less than 2,300 mg per day.  Always check the nutrition information of foods labeled as "low-fat" or "nonfat." These foods may be higher in added sugar or refined carbs and should be avoided.  Talk to your dietitian to identify your daily goals for nutrients listed on the label. Shopping  Avoid buying canned, pre-made, or processed foods. These foods tend to be high in fat, sodium, and added sugar.  Shop around the outside edge of the grocery store. This is where you will most often find fresh fruits and vegetables, bulk grains, fresh meats, and fresh dairy. Cooking  Use low-heat cooking methods, such as baking, instead of high-heat cooking methods like deep frying.  Cook using healthy oils, such as olive, canola, or sunflower oil.  Avoid cooking with butter, cream, or high-fat meats. Meal planning  Eat meals and snacks regularly, preferably at the same times every day. Avoid going long periods of time without eating.  Eat foods that are high in fiber, such as fresh fruits, vegetables, beans, and whole grains. Talk with your dietitian about how many servings of carbs you can eat at each meal.  Eat 4-6 oz (112-168 g) of lean protein each day, such as lean meat, chicken, fish, eggs, or tofu. One ounce (oz) of lean protein is equal to: ? 1 oz (28  g) of meat, chicken, or fish. ? 1 egg. ?  cup (62 g) of tofu.  Eat some foods each day that contain healthy fats, such as avocado, nuts, seeds, and fish.   What foods should I eat? Fruits Berries. Apples. Oranges. Peaches. Apricots. Plums. Grapes. Mango. Papaya. Pomegranate. Kiwi. Cherries. Vegetables Lettuce. Spinach. Leafy greens, including kale, chard, collard greens, and mustard greens. Beets. Cauliflower. Cabbage. Broccoli. Carrots. Green beans. Tomatoes. Peppers. Onions. Cucumbers. Brussels sprouts. Grains Whole grains, such as whole-wheat or whole-grain bread, crackers, tortillas, cereal, and pasta. Unsweetened oatmeal. Quinoa. Brown or wild rice. Meats and other proteins Seafood. Poultry without skin. Lean cuts of poultry and beef. Tofu. Nuts. Seeds. Dairy Low-fat or fat-free dairy products such as milk, yogurt, and cheese. The items listed above may not be a complete list of foods and beverages you can eat. Contact a dietitian for more information. What foods should I avoid? Fruits Fruits canned with syrup. Vegetables Canned vegetables. Frozen vegetables with butter or  cream sauce. Grains Refined white flour and flour products such as bread, pasta, snack foods, and cereals. Avoid all processed foods. Meats and other proteins Fatty cuts of meat. Poultry with skin. Breaded or fried meats. Processed meat. Avoid saturated fats. Dairy Full-fat yogurt, cheese, or milk. Beverages Sweetened drinks, such as soda or iced tea. The items listed above may not be a complete list of foods and beverages you should avoid. Contact a dietitian for more information. Questions to ask a health care provider  Do I need to meet with a diabetes educator?  Do I need to meet with a dietitian?  What number can I call if I have questions?  When are the best times to check my blood glucose? Where to find more information:  American Diabetes Association: diabetes.org  Academy of Nutrition and  Dietetics: www.eatright.[employer] of Diabetes and Digestive and Kidney Diseases: [employer]dk  Association of Diabetes Care and Education Specialists: www.diabeteseducator.org Summary  It is important to have healthy eating habits because your blood sugar (glucose) levels are greatly affected by what you eat and drink.  A healthy meal plan will help you control your blood glucose and maintain a healthy lifestyle.  Your health care provider may recommend that you work with a dietitian to make a meal plan that is best for you.  Keep in mind that carbohydrates (carbs) and alcohol have immediate effects on your blood glucose levels. It is important to count carbs and to use alcohol carefully. This information is not intended to replace advice given to you by your health care provider. Make sure you discuss any questions you have with your health care provider. Document Revised: 11/12/2019 Document Reviewed: 11/12/2019 Elsevier Patient Education  2021 [employer].

## 2021-04-21 DIAGNOSIS — Z23 Encounter for immunization: Secondary | ICD-10-CM | POA: Diagnosis not present

## 2021-05-24 DIAGNOSIS — N401 Enlarged prostate with lower urinary tract symptoms: Secondary | ICD-10-CM | POA: Diagnosis not present

## 2021-05-24 DIAGNOSIS — N3000 Acute cystitis without hematuria: Secondary | ICD-10-CM | POA: Diagnosis not present

## 2021-05-24 DIAGNOSIS — Z8559 Personal history of malignant neoplasm of other urinary tract organ: Secondary | ICD-10-CM | POA: Diagnosis not present

## 2021-05-24 DIAGNOSIS — R3915 Urgency of urination: Secondary | ICD-10-CM | POA: Diagnosis not present

## 2021-05-27 ENCOUNTER — Other Ambulatory Visit: Payer: Self-pay | Admitting: Family Medicine

## 2021-07-20 ENCOUNTER — Other Ambulatory Visit: Payer: Self-pay | Admitting: Internal Medicine

## 2021-08-09 ENCOUNTER — Telehealth: Payer: Self-pay | Admitting: Pharmacist

## 2021-08-09 NOTE — Chronic Care Management (AMB) (Addendum)
    Chronic Care Management Pharmacy Assistant   Name: Charles Castillo.  MRN: [id number] DOB: 07-Aug-1934  Reason for Encounter: General Adherence Call    Recent office visits:  None  Recent consult visits:  None  Hospital visits:  None in previous 6 months  Medications: Outpatient Encounter Medications as of 08/09/2021  Medication Sig   amLODipine (NORVASC) 5 MG tablet TAKE 1 TABLET AT BEDTIME   colchicine 0.6 MG tablet Take 2 tablets by mouth at first sign of gout flare and may repeat 1 tablet in 1 hour. Due to kidney disease do not take again for at least 2 weeks.   OVER THE COUNTER MEDICATION Take 1 tablet by mouth at bedtime as needed (sleep). [employer] brand sleep aid   pantoprazole (PROTONIX) 40 MG tablet TAKE 1 TABLET TWICE A DAY   rosuvastatin (CRESTOR) 40 MG tablet Take 1 tablet (40 mg total) by mouth daily.   No facility-administered encounter medications on file as of 08/09/2021.   Patient Questions: Have you had any problems recently with your health? Patient states he has not had any problems recently with his health.  Have you had any problems with your pharmacy? Patient states he has not had any problems recently with his pharmacy.  What issues or side effects are you having with your medications? Patient states he is not currently having any issues or side effects with any of his medications.  What would you like me to pass along to Leata Mouse, CPP for him to help you with?  Patient states he does not have anything to pass along at this time.  What can we do to take care of you better? Patient did not have any suggestions.  Future Appointments  Date Time Provider [place]  09/30/2021 10:40 AM Marin Olp, MD [facility]     Star Rating Drugs: Rosuvastatin 40 mg last filled 06/27/2021 90 DS  April D Calhoun, [place] Pharmacist Assistant [phone]

## 2021-08-24 DIAGNOSIS — R3915 Urgency of urination: Secondary | ICD-10-CM | POA: Diagnosis not present

## 2021-08-24 DIAGNOSIS — N3020 Other chronic cystitis without hematuria: Secondary | ICD-10-CM | POA: Diagnosis not present

## 2021-08-24 DIAGNOSIS — N401 Enlarged prostate with lower urinary tract symptoms: Secondary | ICD-10-CM | POA: Diagnosis not present

## 2021-09-04 ENCOUNTER — Telehealth (INDEPENDENT_AMBULATORY_CARE_PROVIDER_SITE_OTHER): Payer: Medicare Other | Admitting: Family Medicine

## 2021-09-04 DIAGNOSIS — U071 COVID-19: Secondary | ICD-10-CM

## 2021-09-04 MED ORDER — MOLNUPIRAVIR EUA 200MG CAPSULE: 4.0000 | ORAL_CAPSULE | Freq: Two times a day (BID) | ORAL | 0 refills | Status: AC

## 2021-09-04 NOTE — Telephone Encounter (Signed)
See also phone note.

## 2021-09-05 NOTE — Telephone Encounter (Signed)
Thanks for caring for him!

## 2021-09-06 ENCOUNTER — Telehealth: Payer: Self-pay

## 2021-09-06 NOTE — Telephone Encounter (Signed)
Over the weekend cared for by Dr. Ethelene Hal- please callt o check in on him

## 2021-09-06 NOTE — Telephone Encounter (Signed)
Pt says that he is doing great. He stated that his temp is normal, he is still in quarantine until Wednesday. He says that medicine worked great, and he is on the way to getting well. He appreciated the call.

## 2021-09-06 NOTE — Telephone Encounter (Signed)
Patient Name: Charles Castillo Gender: Male DOB: 07-19-1934 Age: 87 Y 10 M 20 D Return Phone Number: [phone] (Primary) Address: City/ State/ Zip: [place]  [address] Client [place] [facility] [place] Client Site [place] [facility] [place] Physician Garret Reddish- MD Contact Type Call Who Is Calling Patient / Member / Family / Caregiver Call Type Triage / Clinical Relationship To Patient Self Return Phone Number [phone] (Primary) Chief Complaint Fever (non-urgent symptom) (greater than THREE MONTHS old) Reason for Call Symptomatic / Request for [place] has covid and is not sure what to do. Had a fever last night 101 feels better but wants speak to RN. Translation No Nurse Assessment Nurse: Romona Curls, RN, Denyse Amass Date/Time Eilene Ghazi Time): 09/04/2021 10:41:49 AM Confirm and document reason for call. If symptomatic, describe symptoms. ---Caller has covid+, S/sx temp 101 last night, 99.4 (forehead) and congestion. Does the patient have any new or worsening symptoms? ---Yes Will a triage be completed? ---Yes Related visit to physician within the last 2 weeks? ---Yes Does the PT have any chronic conditions? (i.e. diabetes, asthma, this includes High risk factors for pregnancy, etc.) ---Yes List chronic conditions. ---Prediabetic, renal failure Is this a behavioral health or substance abuse call? ---No Guidelines Guideline Title Affirmed Question Affirmed Notes Nurse Date/Time (Eastern Time) COVID-19 - Diagnosed or Suspected [1] HIGH RISK for severe COVID complications (e.g., weak immune system, age > 44 years, obesity with BMI 30 or higher, pregnant, chronic Romona Curls, Loletha Grayer 09/04/2021 10:44:37 AM PLEASE NOTE: All timestamps contained within this report are represented as [place] Standard Time. CONFIDENTIALTY NOTICE: This fax transmission is intended only for the addressee.  It contains information that is legally privileged, confidential or otherwise protected from use or disclosure. If you are not the intended recipient, you are strictly prohibited from reviewing, disclosing, copying using or disseminating any of this information or taking any action in reliance on or regarding this information. If you have received this fax in error, please notify us immediately by telephone so that we can arrange for its return to [place]. Phone: [phone], Toll-Free: [phone], Fax: [phone] Page: 2 of 3 Call Id: [id number] Guidelines Guideline Title Affirmed Question Affirmed Notes Nurse Date/Time Eilene Ghazi Time) lung disease or other chronic medical condition) AND [2] COVID symptoms (e.g., cough, fever) (Exceptions: Already seen by PCP and no new or worsening symptoms.) Disp. Time Eilene Ghazi Time) Disposition Final User 09/04/2021 10:23:24 AM Attempt made - message left Pollyann Kennedy 09/04/2021 10:54:12 AM Paged On Call back to [facility], RN, Denyse Amass 09/04/2021 11:18:44 AM Paged On Call back to [facility], RN, Denyse Amass 09/04/2021 10:52:19 AM Call PCP within 24 Hours Yes Romona Curls, RN, Denyse Amass Caller Disagree/Comply Comply Caller Understands Yes PreDisposition [place] Advice Given Per Guideline CALL PCP WITHIN 24 HOURS: GENERAL CARE ADVICE FOR COVID-19 SYMPTOMS: * The symptoms are generally treated the same whether you have COVID-19, influenza or some other respiratory virus. * IF OFFICE WILL BE CLOSED: I'll page the on-call provider now. EXCEPTION: from 9 pm to 9 am. Since this isn't urgent, we'll hold the page until morning. * Cough: Use cough drops. * Feeling dehydrated: Drink extra liquids. If the air in your home is dry, use a humidifier. * Fever: For fever over 101 F (38.3 C), take acetaminophen every 4 to 6 hours (Adults 650 mg) OR ibuprofen every 6 to 8 hours (Adults 400 mg). Before taking any medicine, read all the instructions  on the  package. Do not take aspirin unless your doctor has prescribed it for you. COVID-19 - HOW TO PROTECT OTHERS - WHEN YOU ARE SICK WITH COVID-19: * STAY HOME A MINIMUM OF 5 DAYS: Home isolation is needed for at least 5 days after the symptoms started. Stay home from school or work if you are sick. Do NOT go to religious services, child care centers, shopping, or other public places. Do NOT use public transportation (e.g., bus, taxis, ridesharing). Do NOT allow any visitors to your home. Leave the house only if you need to seek urgent medical care. * WEAR A MASK FOR 10 DAYS: Wear a well-fitted mask for 10 full days any time you are around others inside your home or in public. Do not go to places where you are unable to wear a mask. CALL BACK IF: * You become worse CARE ADVICE given per COVID-19 - DIAGNOSED OR SUSPECTED (Adult) guideline. PLEASE NOTE: All timestamps contained within this report are represented as [place] Standard Time. CONFIDENTIALTY NOTICE: This fax transmission is intended only for the addressee. It contains information that is legally privileged, confidential or otherwise protected from use or disclosure. If you are not the intended recipient, you are strictly prohibited from reviewing, disclosing, copying using or disseminating any of this information or taking any action in reliance on or regarding this information. If you have received this fax in error, please notify us immediately by telephone so that we can arrange for its return to [place]. Phone: [phone], Toll-Free: [phone], Fax: [phone] Page: 3 of 3 Call Id: [id number] Paging DoctorName Phone DateTime Result/ Outcome Message Type Notes Caryl Bis [phone] 09/04/2021 10:54:12 AM Paged On Call Back to Call Center Doctor Paged Caryl Bis 09/04/2021 11:10:47 AM Spoke with On Call - General Message Result OCP states he would want to treat this patinet. OCP states he will verifiy renal dosing  with pharmacy and send Rx to [facility] [address] [place] [phone]. OCP states stop taking Rosuvastatin while on antiviral. If patient has a fever that is not responding to Tylenol, diffculty breathing or chest pain to seek tx at nearest ED. Caller verbalized understanding. Caryl Bis [phone] 09/04/2021 11:18:44 AM Paged On Call Back to Call Center Doctor Paged Caryl Bis 09/04/2021 11:20:15 AM Spoke with On Call - General Message Result OCP notified that patient also takes Amolodipine and potential drug interaction with paxlovid according to https://www.drugs.com/druginteractions/nirmatrelvirritonavir,paxlovid.html. OCP stated he will double check in patient chart when he gets a chance and verbalized he has contact information for patient for any medication holds/ concerns

## 2021-09-06 NOTE — Telephone Encounter (Signed)
Thanks for taking great care of him/follow-up!

## 2021-09-07 NOTE — Telephone Encounter (Signed)
No problem.

## 2021-09-10 DIAGNOSIS — N139 Obstructive and reflux uropathy, unspecified: Secondary | ICD-10-CM | POA: Diagnosis not present

## 2021-09-10 DIAGNOSIS — N3000 Acute cystitis without hematuria: Secondary | ICD-10-CM | POA: Diagnosis not present

## 2021-09-19 ENCOUNTER — Other Ambulatory Visit: Payer: Self-pay | Admitting: Family Medicine

## 2021-09-27 DIAGNOSIS — N2581 Secondary hyperparathyroidism of renal origin: Secondary | ICD-10-CM | POA: Diagnosis not present

## 2021-09-27 DIAGNOSIS — D631 Anemia in chronic kidney disease: Secondary | ICD-10-CM | POA: Diagnosis not present

## 2021-09-27 DIAGNOSIS — E1129 Type 2 diabetes mellitus with other diabetic kidney complication: Secondary | ICD-10-CM | POA: Diagnosis not present

## 2021-09-27 DIAGNOSIS — I129 Hypertensive chronic kidney disease with stage 1 through stage 4 chronic kidney disease, or unspecified chronic kidney disease: Secondary | ICD-10-CM | POA: Diagnosis not present

## 2021-09-27 DIAGNOSIS — E1122 Type 2 diabetes mellitus with diabetic chronic kidney disease: Secondary | ICD-10-CM | POA: Diagnosis not present

## 2021-09-27 DIAGNOSIS — N184 Chronic kidney disease, stage 4 (severe): Secondary | ICD-10-CM | POA: Diagnosis not present

## 2021-09-27 LAB — HEMOGLOBIN A1C: Hemoglobin A1C: 6

## 2021-09-30 ENCOUNTER — Other Ambulatory Visit: Payer: Self-pay

## 2021-09-30 ENCOUNTER — Encounter: Payer: Self-pay | Admitting: Family Medicine

## 2021-09-30 ENCOUNTER — Ambulatory Visit (INDEPENDENT_AMBULATORY_CARE_PROVIDER_SITE_OTHER): Payer: Medicare Other | Admitting: Family Medicine

## 2021-09-30 ENCOUNTER — Ambulatory Visit: Payer: Medicare Other

## 2021-09-30 VITALS — BP 110/68 | HR 88 | Temp 98.2°F | Ht 74.0 in | Wt 278.6 lb

## 2021-09-30 DIAGNOSIS — I350 Nonrheumatic aortic (valve) stenosis: Secondary | ICD-10-CM | POA: Diagnosis not present

## 2021-09-30 DIAGNOSIS — E1169 Type 2 diabetes mellitus with other specified complication: Secondary | ICD-10-CM

## 2021-09-30 DIAGNOSIS — E1159 Type 2 diabetes mellitus with other circulatory complications: Secondary | ICD-10-CM

## 2021-09-30 DIAGNOSIS — E785 Hyperlipidemia, unspecified: Secondary | ICD-10-CM

## 2021-09-30 DIAGNOSIS — C680 Malignant neoplasm of urethra: Secondary | ICD-10-CM

## 2021-09-30 DIAGNOSIS — N184 Chronic kidney disease, stage 4 (severe): Secondary | ICD-10-CM | POA: Diagnosis not present

## 2021-09-30 DIAGNOSIS — I152 Hypertension secondary to endocrine disorders: Secondary | ICD-10-CM | POA: Diagnosis not present

## 2021-09-30 DIAGNOSIS — E1122 Type 2 diabetes mellitus with diabetic chronic kidney disease: Secondary | ICD-10-CM

## 2021-09-30 MED ORDER — TAMSULOSIN HCL 0.4 MG PO CAPS: 0.4000 mg | ORAL_CAPSULE | Freq: Every day | ORAL | 3 refills | Status: DC

## 2021-09-30 NOTE — Patient Instructions (Addendum)
Health Maintenance Due  Topic Date Due   Zoster Vaccines- Shingrix (1 of 2) Please check with your pharmacy to see if they have the shingrix vaccine. If they do- please get this immunization and update [place] by phone call or mychart with dates you receive the vaccine.  Never done   TETANUS/TDAP Please get this done at your local pharmacy it will be more cost effective.  11/01/2020   Thanks for having them send me labs!   Glad you are doing so well!  Recommended follow up: Return in about 6 months (around 03/31/2022) for follow up- or sooner if needed.

## 2021-09-30 NOTE — Progress Notes (Signed)
Phone [phone] In person visit   Subjective:   Charles Castillo. is a 87 y.o. year old very pleasant male patient who presents for/with See problem oriented charting Chief Complaint  Patient presents with   Hyperlipidemia   Hypertension   This visit occurred during the SARS-CoV-2 public health emergency.  Safety protocols were in place, including screening questions prior to the visit, additional usage of staff PPE, and extensive cleaning of exam room while observing appropriate contact time as indicated for disinfecting solutions.   Past Medical History-  Patient Active Problem List   Diagnosis Date Noted   Mild aortic stenosis 10/25/2018    Priority: 1.   Type II diabetes mellitus with stage 4 chronic kidney disease ([place]) 09/15/2014    Priority: 1.   CKD (chronic kidney disease), stage IV ([place]) 03/24/2008    Priority: 1.   Dysphagia 10/30/2017    Priority: 2.   Gout 05/24/2016    Priority: 2.   Erectile dysfunction 09/15/2014    Priority: 2.   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   Hyperlipidemia associated with type 2 diabetes mellitus ([place]) 08/27/2007    Priority: 2.   Hypertension associated with diabetes ([place]) 07/03/2007    Priority: 2.   Sleep apnea 07/03/2007    Priority: 2.   Former smoker 09/15/2014    Priority: 3.   Morbid obesity ([place]) 10/10/2012    Priority: 3.   Osteoarthritis 07/03/2007    Priority: 3.   COVID-19 virus infection 06/20/2019   Generalized weakness 06/19/2019   Syncope 05/23/2017    Medications- reviewed and updated Current Outpatient Medications  Medication Sig Dispense Refill   amLODipine (NORVASC) 5 MG tablet TAKE 1 TABLET AT BEDTIME 90 tablet 3   colchicine 0.6 MG tablet Take 2 tablets by mouth at first sign of gout flare and may repeat 1 tablet in 1 hour. Due to kidney disease do not take again for at least 2 weeks. 30 tablet 0   OVER THE COUNTER MEDICATION Take 1 tablet by mouth at bedtime as  needed (sleep). [employer] brand sleep aid     pantoprazole (PROTONIX) 40 MG tablet TAKE 1 TABLET TWICE A DAY 180 tablet 0   rosuvastatin (CRESTOR) 40 MG tablet TAKE 1 TABLET DAILY 90 tablet 3   tamsulosin (FLOMAX) 0.4 MG CAPS capsule Take 1 capsule (0.4 mg total) by mouth daily. 90 capsule 3   No current facility-administered medications for this visit.     Objective:  BP 110/68   Pulse 88   Temp 98.2 F (36.8 C) (Temporal)   Ht 6\' 2"  (1.88 m)   Wt 278 lb 9.6 oz (126.4 kg)   SpO2 96%   BMI 35.77 kg/m  Gen: NAD, resting comfortably CV: RRR stable faint murmur Lungs: CTAB no crackles, wheeze, rhonchi Ext: 1+ edema Skin: warm, dry Neuro: grossly normal, moves all extremities   Diabetic Foot Exam - Simple   Simple Foot Form Diabetic Foot exam was performed with the following findings: Yes 09/30/2021 11:11 AM  Visual Inspection No deformities, no ulcerations, no other skin breakdown bilaterally: Yes Sensation Testing Intact to touch and monofilament testing bilaterally: Yes Pulse Check Posterior Tibialis and Dorsalis pulse intact bilaterally: Yes Comments       Assessment and Plan   #Covid- did well with treatment last month- no lingering symptoms.  - discussed has to wait at least 3 months for omicron    #Hypertension/CKD IV- saw Dr. Bishop Dublin PA  Monday and had labs S: Compliant with amlodipine 5 mg  He follows with Dr. Hollie Salk of nephrology.  nephrology has not recommended ACE inhibitor or arb-from prior lab visit "Very slight elevation in microalbumin to creatinine ratio.  Please forward a copy to Dr. Hollie Salk.  We primarily did this for screening metrics-at his level of kidney disease I do not think we would start an ACE -I r an ARB"  -GFR recently checked and noted at 23 BP Readings from Last 3 Encounters:  09/30/21 110/68  03/29/21 118/70  09/28/20 118/70   A/P: Hypertension stable-continue current medication.  CKD stage III overall stable-continues close follow-up with  nephrology.  No changes in medication at this time   #Hyperlipidemia S: Patient is compliant rosuvastatin 40 mg.  Prior was on aspirin but we took him off of this for primary prevention. Lab Results  Component Value Date   CHOL 169 03/11/2021   HDL 53 03/11/2021   LDLCALC 94 03/11/2021   LDLDIRECT 123.0 10/12/2018   TRIG 123 03/11/2021   CHOLHDL 4 04/11/2019   A/P: Controlled. Continue current medications.  - check lipids at next visit     #Diabetes S: Has been diet controlled on recent checks  Lab Results  Component Value Date   HGBA1C 6.0 09/27/2021   HGBA1C 6.1 03/11/2021   HGBA1C 6.3 09/17/2020  A/P: A1c was 6.0 at [facility] earlier this week-team will abstract this-excellent control-continue diet control only  #Mild aortic stenosis S: Last echocardiogram was June 23, 2019 A/P: Offered updated echocardiogram at this time- he prefers to do at 6 month follow up- very reasonable. No symptoms such as CP, SOB, dizziness, syncope.    % Urethral carcinoma-patient follows with [facility]- Dr. Dimas Millin annually.History of urethrectomy and bilateral lymph node dissection. -Also follows with [facility] and has a history of recurrent UTIs.  Follows with Dr. Jeffie Pollock locally. Also on tamsulosin through their office.   # GERD/dysphagia S:on high dose PPI twice daily per DR. Pyrtle - down to one dose a day A/P:  doing well- continue current meds- consider b12 if we check labs next time No results found for: VITAMINB12  #Gout- no recent flares- has colcichine if needed   Recommended follow up: Return in about 6 months (around 03/31/2022) for follow up- or sooner if needed. Future Appointments  Date Time Provider [place]  10/11/2021  8:00 AM [facility]   Lab/Order associations:   ICD-10-CM   1. Type 2 diabetes mellitus with stage 4 chronic kidney disease, without long-term current use of insulin (HCC)  E11.22    N18.4     2. Mild aortic stenosis   I35.0     3. CKD (chronic kidney disease), stage IV (HCC)  N18.4     4. Hypertension associated with diabetes ([place])  E11.59    I15.2     5. Hyperlipidemia associated with type 2 diabetes mellitus (HCC)  E11.69    E78.5     6. 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[place])  C68.0       Meds ordered this encounter  Medications   tamsulosin (FLOMAX) 0.4 MG CAPS capsule    Sig: Take 1 capsule (0.4 mg total) by mouth daily.    Dispense:  90 capsule    Refill:  3     Return precautions advised.  Garret Reddish, MD

## 2021-10-01 ENCOUNTER — Encounter: Payer: Self-pay | Admitting: Family Medicine

## 2021-10-05 ENCOUNTER — Other Ambulatory Visit: Payer: Self-pay | Admitting: Internal Medicine

## 2021-10-10 DIAGNOSIS — Z23 Encounter for immunization: Secondary | ICD-10-CM | POA: Diagnosis not present

## 2021-10-11 ENCOUNTER — Other Ambulatory Visit: Payer: Self-pay

## 2021-10-11 ENCOUNTER — Ambulatory Visit (INDEPENDENT_AMBULATORY_CARE_PROVIDER_SITE_OTHER): Payer: Medicare Other

## 2021-10-11 DIAGNOSIS — Z0000 Encounter for general adult medical examination without abnormal findings: Secondary | ICD-10-CM

## 2021-10-11 NOTE — Patient Instructions (Signed)
Charles Castillo , Thank you for taking time to come for your Medicare Wellness Visit. I appreciate your ongoing commitment to your health goals. Please review the following plan we discussed and let me know if I can assist you in the future.   Screening recommendations/referrals: Colonoscopy: No longer required  Recommended yearly ophthalmology/optometry visit for glaucoma screening and checkup Recommended yearly dental visit for hygiene and checkup  Vaccinations: Influenza vaccine: Done 10/10/21  Pneumococcal vaccine: Up to date Tdap vaccine: Due and discussed Shingles vaccine: Shingrix discussed. Please contact your pharmacy for coverage information.    Covid-19: Completed 1/12, 2/1, & 09/14/20  Advanced directives: Please bring a copy of your health care power of attorney and living will to the office at your convenience.  Conditions/risks identified: None  at this time  Next appointment: Follow up in one year for your annual wellness visit.   Preventive Care 50 Years and Older, Male Preventive care refers to lifestyle choices and visits with your health care provider that can promote health and wellness. What does preventive care include? A yearly physical exam. This is also called an annual well check. Dental exams once or twice a year. Routine eye exams. Ask your health care provider how often you should have your eyes checked. Personal lifestyle choices, including: Daily care of your teeth and gums. Regular physical activity. Eating a healthy diet. Avoiding tobacco and drug use. Limiting alcohol use. Practicing safe sex. Taking low doses of aspirin every day. Taking vitamin and mineral supplements as recommended by your health care provider. What happens during an annual well check? The services and screenings done by your health care provider during your annual well check will depend on your age, overall health, lifestyle risk factors, and family history of disease. Counseling   Your health care provider may ask you questions about your: Alcohol use. Tobacco use. Drug use. Emotional well-being. Home and relationship well-being. Sexual activity. Eating habits. History of falls. Memory and ability to understand (cognition). Work and work Statistician. Screening  You may have the following tests or measurements: Height, weight, and BMI. Blood pressure. Lipid and cholesterol levels. These may be checked every 5 years, or more frequently if you are over 50 years old. Skin check. Lung cancer screening. You may have this screening every year starting at age 61 if you have a 30-pack-year history of smoking and currently smoke or have quit within the past 15 years. Fecal occult blood test (FOBT) of the stool. You may have this test every year starting at age 11. Flexible sigmoidoscopy or colonoscopy. You may have a sigmoidoscopy every 5 years or a colonoscopy every 10 years starting at age 50. Prostate cancer screening. Recommendations will vary depending on your family history and other risks. Hepatitis C blood test. Hepatitis B blood test. Sexually transmitted disease (STD) testing. Diabetes screening. This is done by checking your blood sugar (glucose) after you have not eaten for a while (fasting). You may have this done every 1-3 years. Abdominal aortic aneurysm (AAA) screening. You may need this if you are a current or former smoker. Osteoporosis. You may be screened starting at age 9 if you are at high risk. Talk with your health care provider about your test results, treatment options, and if necessary, the need for more tests. Vaccines  Your health care provider may recommend certain vaccines, such as: Influenza vaccine. This is recommended every year. Tetanus, diphtheria, and acellular pertussis (Tdap, Td) vaccine. You may need a Td booster every 10  years. Zoster vaccine. You may need this after age 43. Pneumococcal 13-valent conjugate (PCV13) vaccine.  One dose is recommended after age 48. Pneumococcal polysaccharide (PPSV23) vaccine. One dose is recommended after age 8. Talk to your health care provider about which screenings and vaccines you need and how often you need them. This information is not intended to replace advice given to you by your health care provider. Make sure you discuss any questions you have with your health care provider. Document Released: 01/01/2016 Document Revised: 08/24/2016 Document Reviewed: 10/06/2015 Elsevier Interactive Patient Education  2017 [place] Prevention in the Home Falls can cause injuries. They can happen to people of all ages. There are many things you can do to make your home safe and to help prevent falls. What can I do on the outside of my home? Regularly fix the edges of walkways and driveways and fix any cracks. Remove anything that might make you trip as you walk through a door, such as a raised step or threshold. Trim any bushes or trees on the path to your home. Use bright outdoor lighting. Clear any walking paths of anything that might make someone trip, such as rocks or tools. Regularly check to see if handrails are loose or broken. Make sure that both sides of any steps have handrails. Any raised decks and porches should have guardrails on the edges. Have any leaves, snow, or ice cleared regularly. Use sand or salt on walking paths during winter. Clean up any spills in your garage right away. This includes oil or grease spills. What can I do in the bathroom? Use night lights. Install grab bars by the toilet and in the tub and shower. Do not use towel bars as grab bars. Use non-skid mats or decals in the tub or shower. If you need to sit down in the shower, use a plastic, non-slip stool. Keep the floor dry. Clean up any water that spills on the floor as soon as it happens. Remove soap buildup in the tub or shower regularly. Attach bath mats securely with double-sided  non-slip rug tape. Do not have throw rugs and other things on the floor that can make you trip. What can I do in the bedroom? Use night lights. Make sure that you have a light by your bed that is easy to reach. Do not use any sheets or blankets that are too big for your bed. They should not hang down onto the floor. Have a firm chair that has side arms. You can use this for support while you get dressed. Do not have throw rugs and other things on the floor that can make you trip. What can I do in the kitchen? Clean up any spills right away. Avoid walking on wet floors. Keep items that you use a lot in easy-to-reach places. If you need to reach something above you, use a strong step stool that has a grab bar. Keep electrical cords out of the way. Do not use floor polish or wax that makes floors slippery. If you must use wax, use non-skid floor wax. Do not have throw rugs and other things on the floor that can make you trip. What can I do with my stairs? Do not leave any items on the stairs. Make sure that there are handrails on both sides of the stairs and use them. Fix handrails that are broken or loose. Make sure that handrails are as long as the stairways. Check any carpeting to make sure  that it is firmly attached to the stairs. Fix any carpet that is loose or worn. Avoid having throw rugs at the top or bottom of the stairs. If you do have throw rugs, attach them to the floor with carpet tape. Make sure that you have a light switch at the top of the stairs and the bottom of the stairs. If you do not have them, ask someone to add them for you. What else can I do to help prevent falls? Wear shoes that: Do not have high heels. Have rubber bottoms. Are comfortable and fit you well. Are closed at the toe. Do not wear sandals. If you use a stepladder: Make sure that it is fully opened. Do not climb a closed stepladder. Make sure that both sides of the stepladder are locked into place. Ask  someone to hold it for you, if possible. Clearly mark and make sure that you can see: Any grab bars or handrails. First and last steps. Where the edge of each step is. Use tools that help you move around (mobility aids) if they are needed. These include: Canes. Walkers. Scooters. Crutches. Turn on the lights when you go into a dark area. Replace any light bulbs as soon as they burn out. Set up your furniture so you have a clear path. Avoid moving your furniture around. If any of your floors are uneven, fix them. If there are any pets around you, be aware of where they are. Review your medicines with your doctor. Some medicines can make you feel dizzy. This can increase your chance of falling. Ask your doctor what other things that you can do to help prevent falls. This information is not intended to replace advice given to you by your health care provider. Make sure you discuss any questions you have with your health care provider. Document Released: 10/01/2009 Document Revised: 05/12/2016 Document Reviewed: 01/09/2015 Elsevier Interactive Patient Education  2017 [employer].

## 2021-10-11 NOTE — Progress Notes (Addendum)
Virtual Visit via Telephone Note  I connected with  Charles Castillo. on 10/11/21 at  8:00 AM EDT by telephone and verified that I am speaking with the correct person using two identifiers.  Medicare Annual Wellness visit completed telephonically due to Covid-19 pandemic.   Persons participating in this call: This Health Coach and this patient.   Location: Patient: Home Provider: Office   I discussed the limitations, risks, security and privacy concerns of performing an evaluation and management service by telephone and the availability of in person appointments. The patient expressed understanding and agreed to proceed.  Unable to perform video visit due to video visit attempted and failed and/or patient does not have video capability.   Some vital signs may be absent or patient reported.   Willette Brace, LPN   Subjective:   Charles Castillo. is a 87 y.o. male who presents for Medicare Annual/Subsequent preventive examination.  Review of Systems     Cardiac Risk Factors include: advanced age (>24men, >37 women);hypertension;diabetes mellitus;dyslipidemia;male gender;obesity (BMI >30kg/m2)     Objective:    There were no vitals filed for this visit. There is no height or weight on file to calculate BMI.  Advanced Directives 10/11/2021 06/19/2019 05/23/2017 05/23/2017 06/17/2016 10/18/2015 10/15/2015  Does Patient Have a Medical Advance Directive? Yes No Yes Yes Yes Yes No  Type of [employer] [place];Living will - - [place];Living will -  Does patient want to make changes to medical advance directive? - - No - Patient declined - - - -  Copy of [place] in Chart? No - copy requested - No - copy requested - - No - copy requested -  Would patient like information on creating a medical advance directive? - No - Patient declined - - - - -    Current Medications  (verified) Outpatient Encounter Medications as of 10/11/2021  Medication Sig   amLODipine (NORVASC) 5 MG tablet TAKE 1 TABLET AT BEDTIME   OVER THE COUNTER MEDICATION Take 1 tablet by mouth at bedtime as needed (sleep). [employer] brand sleep aid   pantoprazole (PROTONIX) 40 MG tablet Take 1 tablet (40 mg total) by mouth 2 (two) times daily. **PLEASE CALL OFFICE TO SCHEDULE FOLLOW UP APPOINTMENT   rosuvastatin (CRESTOR) 40 MG tablet TAKE 1 TABLET DAILY   tamsulosin (FLOMAX) 0.4 MG CAPS capsule Take 1 capsule (0.4 mg total) by mouth daily.   colchicine 0.6 MG tablet Take 2 tablets by mouth at first sign of gout flare and may repeat 1 tablet in 1 hour. Due to kidney disease do not take again for at least 2 weeks. (Patient not taking: Reported on 10/11/2021)   No facility-administered encounter medications on file as of 10/11/2021.    Allergies (verified) Patient has no known allergies.   History: Past Medical History:  Diagnosis Date   Arthritis    BPH (benign prostatic hypertrophy) with urinary obstruction    Cancer (HCC)    urethral    CKD (chronic kidney disease), stage III (HCC)    Diet-controlled type 2 diabetes mellitus ([place])    Duodenitis    peptic   Hiatal hernia    History of cellulitis    2012-- left lower extremitiy   History of urinary tract part removal    2008--  partial urethrectomy for SCC   Hyperlipidemia    Hypertension    Ingrown toenail 10/14/2018   Left big  toe s/p removal   Organic impotence    OSA on CPAP    mild to moderate per study 04-19-2011   Penile lesion    Sleep apnea    CPAP use   Past Surgical History:  Procedure Laterality Date   CYSTO/  BILATERAL RETROGRADE PYELOGRAM/  RESECTION PROSTATIC URETHRAL TUMOR/  EXCISION BIOPSY URETHRAL MEATUS AND MEATOTOMY/  TRANSRECTAL ULTRASOUND PROSTATE BIOPSY'S  12-05-2006   CYSTOSCOPY N/A 06/18/2015   Procedure: CYSTOSCOPY FLEXIBLE;  Surgeon: Irine Seal, MD;  Location: [facility];  Service:  Urology;  Laterality: N/A;   PENILE BIOPSY N/A 06/18/2015   Procedure: PENILE BIOPSY;  Surgeon: Irine Seal, MD;  Location: [facility];  Service: Urology;  Laterality: N/A;   URETHRECTOMY  05-01-2007   Partial  (squamous cell carcinoma )   Family History  Problem Relation Age of Onset   Heart failure Father        CHF, no history MI   Colon cancer Neg Hx    Stomach cancer Neg Hx    Esophageal cancer Neg Hx    Social History   Socioeconomic History   Marital status: Married    Spouse name: Not on file   Number of children: Not on file   Years of education: Not on file   Highest education level: Not on file  Occupational History   Not on file  Tobacco Use   Smoking status: Former    Packs/day: 1.00    Years: 25.00    Pack years: 25.00    Types: Cigarettes    Quit date: 12/19/1968    Years since quitting: 52.8   Smokeless tobacco: Never  Vaping Use   Vaping Use: Never used  Substance and Sexual Activity   Alcohol use: No    Alcohol/week: 0.0 standard drinks   Drug use: No   Sexual activity: Not Currently  Other Topics Concern   Not on file  Social History Narrative   Married 1999. 5 kids from previous marriage. 2 step kids. 19 grandkids. 9 greatgrandchildren.   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       Wife-hcpoa and this document details wishes. Patient does not want long term life preserving measures but would want resuscitation and intubation if needed.    Social Determinants of Health   Financial Resource Strain: Low Risk    Difficulty of Paying Living Expenses: Not hard at all  Food Insecurity: No Food Insecurity   Worried About Charity fundraiser in the Last Year: Never true   [place] in the Last Year: Never true  Transportation Needs: No Transportation Needs   Lack of Transportation  (Medical): No   Lack of Transportation (Non-Medical): No  Physical Activity: Inactive   Days of Exercise per Week: 0 days   Minutes of Exercise per Session: 0 min  Stress: No Stress Concern Present   Feeling of Stress : Not at all  Social Connections: Moderately Isolated   Frequency of Communication with Friends and Family: More than three times a week   Frequency of Social Gatherings with Friends and Family: More than three times a week   Attends Religious Services: Never   Marine scientist or Organizations: No   Attends Archivist Meetings: Never  Marital Status: Married    Tobacco Counseling Counseling given: Not Answered   Clinical Intake:  Pre-visit preparation completed: Yes  Pain : No/denies pain     BMI - recorded: 35.77 Nutritional Status: BMI > 30  Obese Nutritional Risks: None Diabetes: Yes CBG done?: No Did pt. bring in CBG monitor from home?: No  How often do you need to have someone help you when you read instructions, pamphlets, or other written materials from your doctor or pharmacy?: 1 - Never  Diabetic?Nutrition Risk Assessment:  Has the patient had any N/V/D within the last 2 months?  No  Does the patient have any non-healing wounds?  No  Has the patient had any unintentional weight loss or weight gain?  No   Diabetes:  Is the patient diabetic?  Yes  If diabetic, was a CBG obtained today?  No  Did the patient bring in their glucometer from home?  No  How often do you monitor your CBG's? N/A.   Financial Strains and Diabetes Management:  Are you having any financial strains with the device, your supplies or your medication? No .  Does the patient want to be seen by Chronic Care Management for management of their diabetes?  No  Would the patient like to be referred to a Nutritionist or for Diabetic Management?  No   Diabetic Exams:  Diabetic Eye Exam: Overdue for diabetic eye exam. Pt has been advised about the importance in  completing this exam. Patient advised to call and schedule an eye exam. Diabetic Foot Exam: Completed 09/30/21   Interpreter Needed?: No  Information entered by :: Charlott Rakes, LPN   Activities of Daily Living In your present state of health, do you have any difficulty performing the following activities: 10/11/2021  Hearing? Y  Comment wears hearing  Vision? N  Difficulty concentrating or making decisions? N  Walking or climbing stairs? N  Dressing or bathing? N  Doing errands, shopping? N  Preparing Food and eating ? N  Using the Toilet? N  In the past six months, have you accidently leaked urine? Y  Comment had a UTI that just finished medication for  Do you have problems with loss of bowel control? N  Managing your Medications? N  Managing your Finances? N  Housekeeping or managing your Housekeeping? N  Some recent data might be hidden    Patient Care Team: Marin Olp, MD as PCP - General (Family Medicine) Madelon Lips, MD as Consulting Physician (Nephrology) Janalyn Harder, MD (Urology) Pyrtle, Lajuan Lines, MD as Consulting Physician (Gastroenterology) Irine Seal, MD as Attending Physician (Urology) Martinique, Amy, MD as Consulting Physician (Dermatology) Madelin Rear, [facility] as Pharmacist (Pharmacist)  Indicate any recent Medical Services you may have received from other than [facility] providers in the past year (date may be approximate).     Assessment:   This is a routine wellness examination for [place].  Hearing/Vision screen Hearing Screening - Comments:: Wears hearing aids  Vision Screening - Comments:: Pt follows up with [facility] for annual eye exams   Dietary issues and exercise activities discussed: Current Exercise Habits: The patient does not participate in regular exercise at present   Goals Addressed             This Visit's Progress    Patient Stated       None at this time        Depression Screen [facility] 2/9 Scores 10/11/2021  09/30/2021 09/28/2020 03/26/2020 09/26/2019 01/14/2019 10/30/2017  PHQ - 2 Score 0 0 0 0 0 0 0  PHQ- 9 Score - - 0 - - - -    Fall Risk Fall Risk  10/11/2021 09/30/2021 09/28/2020 03/26/2020 09/26/2019  Falls in the past year? 0 0 0 0 1  Comment - - - - -  Number falls in past yr: 0 0 0 0 0  Injury with Fall? 0 0 0 0 0  Risk for fall due to : Impaired vision No Fall Risks - - -  Follow up Falls prevention discussed Falls evaluation completed - - -    FALL RISK PREVENTION PERTAINING TO THE HOME:  Any stairs in or around the home? Yes  If so, are there any without handrails? No  Home free of loose throw rugs in walkways, pet beds, electrical cords, etc? Yes  Adequate lighting in your home to reduce risk of falls? Yes   ASSISTIVE DEVICES UTILIZED TO PREVENT FALLS:  Life alert? No  Use of a cane, walker or w/c? No  Grab bars in the bathroom? Yes  Shower chair or bench in shower? No  Elevated toilet seat or a handicapped toilet? No   TIMED UP AND GO:  Was the test performed? No .  Cognitive Function: MMSE - Mini Mental State Exam 06/17/2016  Not completed: (No Data)     6CIT Screen 10/11/2021  What Year? 0 points  What month? 0 points  What time? 0 points  Count back from 20 0 points  Months in reverse 0 points  Repeat phrase 0 points  Total Score 0    Immunizations Immunization History  Administered Date(s) Administered   Influenza Split 09/20/2012   Influenza Whole 12/19/2005, 09/30/2009, 09/10/2010   Influenza, High Dose Seasonal PF 09/12/2017, 09/24/2018, 08/20/2019, 09/10/2020   Influenza,inj,Quad PF,6+ Mos 09/15/2014   Influenza,inj,Quad PF,6-35 Mos 09/18/2013   Influenza-Unspecified 09/25/2015, 10/04/2016, 10/10/2021   PFIZER(Purple Top)SARS-COV-2 Vaccination 12/31/2019, 01/20/2020, 09/14/2020   Pneumococcal Conjugate-13 03/19/2015   Pneumococcal Polysaccharide-23 09/18/2004   Td 11/01/2010    TDAP status: Due, Education has been provided regarding the  importance of this vaccine. Advised may receive this vaccine at local pharmacy or Health Dept. Aware to provide a copy of the vaccination record if obtained from local pharmacy or Health Dept. Verbalized acceptance and understanding.  Flu Vaccine status: Up to date  Pneumococcal vaccine status: Up to date  Covid-19 vaccine status: Completed vaccines  Qualifies for Shingles Vaccine? No Zostavax completed No   Shingrix Completed?: No.    Education has been provided regarding the importance of this vaccine. Patient has been advised to call insurance company to determine out of pocket expense if they have not yet received this vaccine. Advised may also receive vaccine at local pharmacy or Health Dept. Verbalized acceptance and understanding.  Screening Tests Health Maintenance  Topic Date Due   Zoster Vaccines- Shingrix (1 of 2) Never done   TETANUS/TDAP  11/01/2020   OPHTHALMOLOGY EXAM  11/17/2021 (Originally 11/11/2020)   COVID-19 Vaccine (4 - Booster for Pfizer series) 12/18/2021 (Originally 11/09/2020)   HEMOGLOBIN A1C  03/28/2022   URINE MICROALBUMIN  03/29/2022   FOOT EXAM  09/30/2022   Pneumonia Vaccine 6+ Years old  Completed   INFLUENZA VACCINE  Completed   HPV VACCINES  Aged Out    Health Maintenance  Health Maintenance Due  Topic Date Due   Zoster Vaccines- Shingrix (1 of 2) Never done   TETANUS/TDAP  11/01/2020    Colorectal cancer screening: No longer required.  Additional Screening:   Vision Screening: Recommended annual ophthalmology exams for early detection of glaucoma and other disorders of the eye. Is the patient up to date with their annual eye exam?  Yes  Who is the provider or what is the name of the office in which the patient attends annual eye exams? [facility]  If pt is not established with a provider, would they like to be referred to a provider to establish care? No .   Dental Screening: Recommended annual dental exams for proper oral  hygiene  Community Resource Referral / Chronic Care Management: CRR required this visit?  No   CCM required this visit?  No      Plan:     I have personally reviewed and noted the following in the patient's chart:   Medical and social history Use of alcohol, tobacco or illicit drugs  Current medications and supplements including opioid prescriptions. Patient is not currently taking opioid prescriptions. Functional ability and status Nutritional status Physical activity Advanced directives List of other physicians Hospitalizations, surgeries, and ER visits in previous 12 months Vitals Screenings to include cognitive, depression, and falls Referrals and appointments  In addition, I have reviewed and discussed with patient certain preventive protocols, quality metrics, and best practice recommendations. A written personalized care plan for preventive services as well as general preventive health recommendations were provided to patient.     Willette Brace, LPN   [id number]/88/[id number]   Nurse Notes: None

## 2021-11-26 DIAGNOSIS — Z23 Encounter for immunization: Secondary | ICD-10-CM | POA: Diagnosis not present

## 2021-11-29 ENCOUNTER — Telehealth: Payer: Medicare Other

## 2021-12-18 ENCOUNTER — Encounter: Payer: Self-pay | Admitting: Family Medicine

## 2021-12-21 ENCOUNTER — Other Ambulatory Visit: Payer: Self-pay | Admitting: *Deleted

## 2021-12-21 MED ORDER — TAMSULOSIN HCL 0.4 MG PO CAPS: 0.4000 mg | ORAL_CAPSULE | Freq: Every day | ORAL | 3 refills | Status: DC

## 2021-12-24 DIAGNOSIS — R3914 Feeling of incomplete bladder emptying: Secondary | ICD-10-CM | POA: Diagnosis not present

## 2021-12-24 DIAGNOSIS — R351 Nocturia: Secondary | ICD-10-CM | POA: Diagnosis not present

## 2021-12-24 DIAGNOSIS — N3020 Other chronic cystitis without hematuria: Secondary | ICD-10-CM | POA: Diagnosis not present

## 2021-12-24 DIAGNOSIS — Z8559 Personal history of malignant neoplasm of other urinary tract organ: Secondary | ICD-10-CM | POA: Diagnosis not present

## 2021-12-24 DIAGNOSIS — N403 Nodular prostate with lower urinary tract symptoms: Secondary | ICD-10-CM | POA: Diagnosis not present

## 2021-12-25 ENCOUNTER — Encounter: Payer: Self-pay | Admitting: Family Medicine

## 2021-12-26 MED ORDER — TAMSULOSIN HCL 0.4 MG PO CAPS: 0.8000 mg | ORAL_CAPSULE | Freq: Every day | ORAL | 3 refills | Status: DC

## 2022-02-01 ENCOUNTER — Other Ambulatory Visit: Payer: Self-pay

## 2022-02-01 ENCOUNTER — Encounter: Payer: Self-pay | Admitting: Family Medicine

## 2022-02-01 MED ORDER — ROSUVASTATIN CALCIUM 40 MG PO TABS: 40.0000 mg | ORAL_TABLET | Freq: Every day | ORAL | 3 refills | Status: DC

## 2022-02-01 MED ORDER — TAMSULOSIN HCL 0.4 MG PO CAPS: 0.8000 mg | ORAL_CAPSULE | Freq: Every day | ORAL | 3 refills | Status: DC

## 2022-02-01 MED ORDER — AMLODIPINE BESYLATE 5 MG PO TABS: 5.0000 mg | ORAL_TABLET | Freq: Every day | ORAL | 3 refills | Status: DC

## 2022-02-09 ENCOUNTER — Other Ambulatory Visit: Payer: Self-pay | Admitting: Family Medicine

## 2022-02-09 MED ORDER — ROSUVASTATIN CALCIUM 10 MG PO TABS: 10.0000 mg | ORAL_TABLET | Freq: Every day | ORAL | 3 refills | Status: DC

## 2022-02-09 NOTE — Telephone Encounter (Signed)
I called and spoke with [facility] and was informed pt was not active in their system so nothing needed to be canceled.

## 2022-02-12 ENCOUNTER — Encounter: Payer: Self-pay | Admitting: Family Medicine

## 2022-02-14 NOTE — Telephone Encounter (Signed)
Message left for patient to call back. Patient can drop off medical release form per keba.

## 2022-02-16 DIAGNOSIS — C680 Malignant neoplasm of urethra: Secondary | ICD-10-CM | POA: Diagnosis not present

## 2022-02-16 DIAGNOSIS — K862 Cyst of pancreas: Secondary | ICD-10-CM | POA: Diagnosis not present

## 2022-02-18 ENCOUNTER — Telehealth: Payer: Self-pay | Admitting: Family Medicine

## 2022-02-18 ENCOUNTER — Encounter: Payer: Self-pay | Admitting: Family Medicine

## 2022-02-18 DIAGNOSIS — C680 Malignant neoplasm of urethra: Secondary | ICD-10-CM | POA: Diagnosis not present

## 2022-02-18 NOTE — Telephone Encounter (Signed)
Noted  

## 2022-02-18 NOTE — Telephone Encounter (Signed)
..  Type of form received: Med Rec Req ? ?Additional comments:  ? ?Received by: Adonis Brook ? ?Form should be Faxed to: ? ?Form should be mailed to:  [facility] ? ?Is patient requesting call for pickup: ? ? ?Form placed:  In provider's box  ? ?Attach charge sheet. yes ? ?Individual made aware of 3-5 business day turn around (Y/N)? ? yes ?

## 2022-02-21 NOTE — Telephone Encounter (Signed)
Patient is already scheduled for 04/08/22. ?

## 2022-02-23 DIAGNOSIS — Z20822 Contact with and (suspected) exposure to covid-19: Secondary | ICD-10-CM | POA: Diagnosis not present

## 2022-02-23 DIAGNOSIS — R051 Acute cough: Secondary | ICD-10-CM | POA: Diagnosis not present

## 2022-02-23 DIAGNOSIS — R059 Cough, unspecified: Secondary | ICD-10-CM | POA: Diagnosis not present

## 2022-02-25 NOTE — Progress Notes (Incomplete)
Phone [phone] In person visit   Subjective:   Charles Primo. is a 88 y.o. year old very pleasant male patient who presents for/with See problem oriented charting No chief complaint on file.   This visit occurred during the SARS-CoV-2 public health emergency.  Safety protocols were in place, including screening questions prior to the visit, additional usage of staff PPE, and extensive cleaning of exam room while observing appropriate contact time as indicated for disinfecting solutions.   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 90 tablet 3   colchicine 0.6 MG tablet Take 2 tablets by mouth at first sign of gout flare and may repeat 1 tablet in 1 hour. Due to kidney disease do not take again for at least 2 weeks. (Patient not taking: Reported on 10/11/2021) 30 tablet 0   OVER THE COUNTER MEDICATION Take 1 tablet by mouth at bedtime as needed (sleep). [employer] brand sleep aid     pantoprazole (PROTONIX) 40 MG tablet Take 1 tablet (40 mg total) by mouth 2 (two) times daily. **PLEASE CALL OFFICE TO SCHEDULE FOLLOW UP APPOINTMENT 180  tablet 0   rosuvastatin (CRESTOR) 10 MG tablet Take 1 tablet (10 mg total) by mouth daily. 90 tablet 3   tamsulosin (FLOMAX) 0.4 MG CAPS capsule Take 2 capsules (0.8 mg total) by mouth daily. 180 capsule 3   No current facility-administered medications for this visit.     Objective:  There were no vitals taken for this visit. Gen: NAD, resting comfortably CV: RRR no murmurs rubs or gallops Lungs: CTAB no crackles, wheeze, rhonchi Abdomen: soft/nontender/nondistended/normal bowel sounds. No rebound or guarding.  Ext: no edema Skin: warm, dry Neuro: grossly normal, moves all extremities  ***    Assessment and Plan   ***09/28/20 awv as moved to supplmeent, avoids with nursing  #Hypertension/CKD IV S: Compliant with amlodipine 5 mg  He follows with Dr. Hollie Salk of nephrology. No microalbuminuria and nephrology had not recommended ACE inhibitor or arb Home readings #s: *** BP Readings from Last 3 Encounters:  09/30/21 110/68  03/29/21 118/70  09/28/20 118/70  A/P: ***  #Hyperlipidemia S: Patient is compliant with atorvastatin 10 mg Prior was on aspirin but we took him off of this for primary prevention. Lab Results  Component Value Date   CHOL 169 03/11/2021   HDL 53 03/11/2021   LDLCALC 94 03/11/2021   LDLDIRECT 123.0 10/12/2018   TRIG 123 03/11/2021   CHOLHDL 4 04/11/2019   A/P: ***  #Diabetes S:  been diet controlled on recent checks CBGs- *** Exercise and diet- *** Lab Results  Component Value Date   HGBA1C 6.0 09/27/2021   HGBA1C  6.1 03/11/2021   HGBA1C 6.3 09/17/2020    A/P: ***  #Mild aortic stenosis S: Last echocardiogram was June 23, 2019 A/P:  # Urethral carcinoma-patient follows with [facility].History of urethrectomy and bilateral lymph node dissection.  # GERD/dysphagia S:on high dose PPI twice daily per DR. Pyrtle - down to one dose a day A/P: ***   *** Very slight elevation in microalbumin to creatinine ratio. Please forward a copy to Dr. Hollie Salk.  We primarily did this for screening metrics-at his level of kidney disease I do not think we would start an ACE -I r an ARB  ***02/18/22 2. Increased size of a cystic lesion in the pancreas tail which may  represent a sidebranch IPMN versus neuroendocrine. Recommend 6 month follow  up abdominal MRI with MRCP.   Recommended follow up: No follow-ups on file. Future Appointments  Date Time Provider [place]  04/08/2022 10:00 AM Marin Olp, MD [facility]  10/24/2022 10:15 AM [facility] HEALTH COACH [facility]    Lab/Order associations: No diagnosis found.  No orders of the defined types were placed in this encounter.   Return precautions advised.  Burnett Corrente

## 2022-02-28 NOTE — Telephone Encounter (Signed)
Received Medical Records scanned into patients chart. ?

## 2022-03-02 ENCOUNTER — Other Ambulatory Visit: Payer: Self-pay

## 2022-03-02 ENCOUNTER — Encounter: Payer: Self-pay | Admitting: Family Medicine

## 2022-03-02 DIAGNOSIS — C680 Malignant neoplasm of urethra: Secondary | ICD-10-CM

## 2022-03-18 DIAGNOSIS — Z20822 Contact with and (suspected) exposure to covid-19: Secondary | ICD-10-CM | POA: Diagnosis not present

## 2022-03-21 DIAGNOSIS — N3020 Other chronic cystitis without hematuria: Secondary | ICD-10-CM | POA: Diagnosis not present

## 2022-03-21 DIAGNOSIS — R972 Elevated prostate specific antigen [PSA]: Secondary | ICD-10-CM | POA: Diagnosis not present

## 2022-03-21 DIAGNOSIS — R935 Abnormal findings on diagnostic imaging of other abdominal regions, including retroperitoneum: Secondary | ICD-10-CM | POA: Diagnosis not present

## 2022-03-21 DIAGNOSIS — Z8559 Personal history of malignant neoplasm of other urinary tract organ: Secondary | ICD-10-CM | POA: Diagnosis not present

## 2022-03-30 DIAGNOSIS — D631 Anemia in chronic kidney disease: Secondary | ICD-10-CM | POA: Diagnosis not present

## 2022-03-30 DIAGNOSIS — N184 Chronic kidney disease, stage 4 (severe): Secondary | ICD-10-CM | POA: Diagnosis not present

## 2022-03-30 DIAGNOSIS — E785 Hyperlipidemia, unspecified: Secondary | ICD-10-CM | POA: Diagnosis not present

## 2022-03-30 DIAGNOSIS — E1122 Type 2 diabetes mellitus with diabetic chronic kidney disease: Secondary | ICD-10-CM | POA: Diagnosis not present

## 2022-03-30 DIAGNOSIS — N2581 Secondary hyperparathyroidism of renal origin: Secondary | ICD-10-CM | POA: Diagnosis not present

## 2022-03-30 DIAGNOSIS — I129 Hypertensive chronic kidney disease with stage 1 through stage 4 chronic kidney disease, or unspecified chronic kidney disease: Secondary | ICD-10-CM | POA: Diagnosis not present

## 2022-04-04 DIAGNOSIS — Z20822 Contact with and (suspected) exposure to covid-19: Secondary | ICD-10-CM | POA: Diagnosis not present

## 2022-04-07 DIAGNOSIS — Z20822 Contact with and (suspected) exposure to covid-19: Secondary | ICD-10-CM | POA: Diagnosis not present

## 2022-04-08 ENCOUNTER — Ambulatory Visit (INDEPENDENT_AMBULATORY_CARE_PROVIDER_SITE_OTHER): Payer: Medicare Other | Admitting: Family Medicine

## 2022-04-08 ENCOUNTER — Encounter: Payer: Self-pay | Admitting: Family Medicine

## 2022-04-08 VITALS — BP 120/60 | HR 72 | Temp 98.2°F | Ht 74.0 in | Wt 277.8 lb

## 2022-04-08 DIAGNOSIS — N184 Chronic kidney disease, stage 4 (severe): Secondary | ICD-10-CM

## 2022-04-08 DIAGNOSIS — I350 Nonrheumatic aortic (valve) stenosis: Secondary | ICD-10-CM | POA: Diagnosis not present

## 2022-04-08 DIAGNOSIS — I10 Essential (primary) hypertension: Secondary | ICD-10-CM | POA: Diagnosis not present

## 2022-04-08 DIAGNOSIS — E1122 Type 2 diabetes mellitus with diabetic chronic kidney disease: Secondary | ICD-10-CM

## 2022-04-08 DIAGNOSIS — Z79899 Other long term (current) drug therapy: Secondary | ICD-10-CM

## 2022-04-08 DIAGNOSIS — E1169 Type 2 diabetes mellitus with other specified complication: Secondary | ICD-10-CM

## 2022-04-08 DIAGNOSIS — E785 Hyperlipidemia, unspecified: Secondary | ICD-10-CM | POA: Diagnosis not present

## 2022-04-08 LAB — CBC WITH DIFFERENTIAL/PLATELET
Basophils Absolute: 0.2 10*3/uL — ABNORMAL HIGH (ref 0.0–0.1)
Basophils Relative: 2.1 % (ref 0.0–3.0)
Eosinophils Absolute: 0.4 10*3/uL (ref 0.0–0.7)
Eosinophils Relative: 5.4 % — ABNORMAL HIGH (ref 0.0–5.0)
HCT: 42.5 % (ref 39.0–52.0)
Hemoglobin: 14 g/dL (ref 13.0–17.0)
Lymphocytes Relative: 23.4 % (ref 12.0–46.0)
Lymphs Abs: 1.7 10*3/uL (ref 0.7–4.0)
MCHC: 33 g/dL (ref 30.0–36.0)
MCV: 90.9 fl (ref 78.0–100.0)
Monocytes Absolute: 0.7 10*3/uL (ref 0.1–1.0)
Monocytes Relative: 9.4 % (ref 3.0–12.0)
Neutro Abs: 4.4 10*3/uL (ref 1.4–7.7)
Neutrophils Relative %: 59.7 % (ref 43.0–77.0)
Platelets: 174 10*3/uL (ref 150.0–400.0)
RBC: 4.68 Mil/uL (ref 4.22–5.81)
RDW: 15.2 % (ref 11.5–15.5)
WBC: 7.4 10*3/uL (ref 4.0–10.5)

## 2022-04-08 LAB — COMPREHENSIVE METABOLIC PANEL
ALT: 19 U/L (ref 0–53)
AST: 20 U/L (ref 0–37)
Albumin: 3.9 g/dL (ref 3.5–5.2)
Alkaline Phosphatase: 67 U/L (ref 39–117)
BUN: 42 mg/dL — ABNORMAL HIGH (ref 6–23)
CO2: 23 mEq/L (ref 19–32)
Calcium: 9.2 mg/dL (ref 8.4–10.5)
Chloride: 109 mEq/L (ref 96–112)
Creatinine, Ser: 2.6 mg/dL — ABNORMAL HIGH (ref 0.40–1.50)
GFR: 21.81 mL/min — ABNORMAL LOW (ref >60.00)
Glucose, Bld: 124 mg/dL — ABNORMAL HIGH (ref 70–99)
Potassium: 4.9 mEq/L (ref 3.5–5.1)
Sodium: 141 mEq/L (ref 135–145)
Total Bilirubin: 0.6 mg/dL (ref 0.2–1.2)
Total Protein: 6.7 g/dL (ref 6.0–8.3)

## 2022-04-08 LAB — LIPID PANEL
Cholesterol: 167 mg/dL (ref 0–200)
HDL: 51.2 mg/dL (ref >39.00)
LDL Cholesterol: 94 mg/dL (ref 0–99)
NonHDL: 116.08
Total CHOL/HDL Ratio: 3
Triglycerides: 109 mg/dL (ref 0.0–149.0)
VLDL: 21.8 mg/dL (ref 0.0–40.0)

## 2022-04-08 LAB — MICROALBUMIN / CREATININE URINE RATIO
Creatinine,U: 75.1 mg/dL
Microalb Creat Ratio: 19.5 mg/g (ref 0.0–30.0)
Microalb, Ur: 14.6 mg/dL — ABNORMAL HIGH (ref 0.0–1.9)

## 2022-04-08 LAB — HEMOGLOBIN A1C: Hgb A1c MFr Bld: 6.2 % (ref 4.6–6.5)

## 2022-04-08 LAB — VITAMIN B12: Vitamin B-12: 317 pg/mL (ref 211–911)

## 2022-04-08 NOTE — Patient Instructions (Addendum)
Sign release of information at the check out desk for diabetic eye exam. ? ?Let us know when you get your latest COVID vaccine, [facility] and TDAP at your pharmacy. ? ?Please stop by lab before you go ?If you have mychart- we will send your results within 3 business days of [place] receiving them.  ?If you do not have mychart- we will call you about results within 5 business days of [place] receiving them.  ?*please also note that you will see labs on mychart as soon as they post. I will later go in and write notes on them- will say "notes from Dr. Yong Channel"  ? ?Recommended follow up: Return in about 6 months (around 10/08/2022) for followup or sooner if needed.Schedule b4 you leave.  ?

## 2022-04-09 ENCOUNTER — Encounter: Payer: Self-pay | Admitting: Family Medicine

## 2022-04-25 ENCOUNTER — Telehealth: Payer: Self-pay | Admitting: Pharmacist

## 2022-04-25 DIAGNOSIS — Z20822 Contact with and (suspected) exposure to covid-19: Secondary | ICD-10-CM | POA: Diagnosis not present

## 2022-04-25 NOTE — Progress Notes (Signed)
? ? ?Chronic Care Management ?Pharmacy Assistant  ? ?Name: Charles Castillo.  MRN: [id number] DOB: 1934-01-28 ? ?Reason for Encounter: General Adherence Call ?  ? ?Recent office visits:  ?04/08/2022 OV (PCP) Charles Olp, MD; no medication changes indicated. ? ?Recent consult visits:  ?none ? ?Hospital visits:  ?None in previous 6 months ? ?Medications: ?Outpatient Encounter Medications as of 04/25/2022  ?Medication Sig  ? amLODipine (NORVASC) 5 MG tablet Take 1 tablet (5 mg total) by mouth at bedtime.  ? colchicine 0.6 MG tablet Take 2 tablets by mouth at first sign of gout flare and may repeat 1 tablet in 1 hour. Due to kidney disease do not take again for at least 2 weeks.  ? OVER THE COUNTER MEDICATION Take 1 tablet by mouth at bedtime as needed (sleep). [employer] brand sleep aid  ? pantoprazole (PROTONIX) 40 MG tablet Take 1 tablet (40 mg total) by mouth 2 (two) times daily. **PLEASE CALL OFFICE TO SCHEDULE FOLLOW UP APPOINTMENT  ? rosuvastatin (CRESTOR) 10 MG tablet Take 1 tablet (10 mg total) by mouth daily.  ? tamsulosin (FLOMAX) 0.4 MG CAPS capsule Take 2 capsules (0.8 mg total) by mouth daily.  ? ?No facility-administered encounter medications on file as of 04/25/2022.  ? ?Contacted Charles Castillo. for General Review Call ? ? ?Chart Review: ? ?Have there been any documented new, changed, or discontinued medications since last visit? Yes ?Has there been any documented recent hospitalizations or ED visits since last visit with Clinical Pharmacist? No ?Brief Summary: Rosuvastatin changed from 40 mg to 10 mg due to kidney function. ? ? ?Adherence Review: ? ?Does the Clinical Pharmacist Assistant have access to adherence rates? Yes ?Adherence rates for STAR metric medications: ?Rosuvastatin 10 mg last filled 04/22/2022 90 DS ?Does the patient have >5 day gap between last estimated fill dates for any of the above medications or other medication gaps? No ?Reason for medication gaps. ? ? ?Disease State  Questions: ? ?Able to connect with Patient? Yes ?Did patient have any problems with their health recently? No ?Note problems and Concerns: ?Have you had any admissions or emergency room visits or worsening of your condition(s) since last visit? No ?Details of ED visit, hospital visit and/or worsening condition(s): ?Have you had any visits with new specialists or providers since your last visit? No ?Explain: ?Have you had any new health care problem(s) since your last visit? No ?New problem(s) reported: ?Have you run out of any of your medications since you last spoke with clinical pharmacist? No ?What caused you to run out of your medications? ?Are there any medications you are not taking as prescribed? No ?What kept you from taking your medications as prescribed? ?Are you having any issues or side effects with your medications? No ?Note of issues or side effects: ?Do you have any other health concerns or questions you want to discuss with your Clinical Pharmacist before your next visit? No ?Note additional concerns and questions from Patient. ?Are there any health concerns that you feel we can do a better job addressing? No ?Note Patient's response. ?Are you having any problems with any of the following since the last visit: ? None ? Details: ?12. Any falls since last visit? No ? Details: ?13. Any increased or uncontrolled pain since last visit? No ? ? ?Care Gaps: ?Medicare Annual Wellness: Completed 10/14/2021 ?Hemoglobin A1C: 6.2% on 04/08/2022 ?Colonoscopy: Completed 12/19/2008 ? ?Future Appointments  ?Date Time Provider [place]  ?09/27/2022  9:20 AM  Charles Olp, MD [facility]  ?10/24/2022 10:15 AM [facility] HEALTH CO[facility]  ? ?Charles Castillo, [place] ?Clinical Pharmacist Assistant ?[phone] ?

## 2022-04-27 DIAGNOSIS — Z20822 Contact with and (suspected) exposure to covid-19: Secondary | ICD-10-CM | POA: Diagnosis not present

## 2022-04-28 ENCOUNTER — Telehealth: Payer: Self-pay

## 2022-04-28 NOTE — Chronic Care Management (AMB) (Signed)
?  Chronic Care Management  ? ?Note ? ?04/28/2022 ?Name: Charles Castillo. MRN: [id number] DOB: Jan 09, 1934 ? ?[name]. is a 88 y.o. year old male who is a primary care patient of Yong Channel, Brayton Mars, MD. Charles Castillo. is currently enrolled in care management services. An additional referral for RNCM was placed.  ? ?Follow up plan: ?Patient declines RNCM engagement . Appropriate care team members and provider have been notified via electronic communication.  ? ?Noreene Larsson, RMA ?Care Guide, Embedded Care Coordination ?[place]  Care Management  ?[place], [place] [address] ?Direct Dial: [phone] ?Museum/gallery [email or web address].[email or web address] ?Website: [email or web address]  ? ?

## 2022-05-12 DIAGNOSIS — N3020 Other chronic cystitis without hematuria: Secondary | ICD-10-CM | POA: Diagnosis not present

## 2022-05-20 ENCOUNTER — Telehealth: Payer: Self-pay

## 2022-05-20 NOTE — Telephone Encounter (Signed)
Called and lm on pt vm tcb regarding CPAP pressure.

## 2022-06-23 DIAGNOSIS — R351 Nocturia: Secondary | ICD-10-CM | POA: Diagnosis not present

## 2022-07-15 ENCOUNTER — Telehealth: Payer: Self-pay | Admitting: Internal Medicine

## 2022-07-15 NOTE — Telephone Encounter (Signed)
Patient called regarding his medication Pantoprazole sodium. Please call to advise.

## 2022-07-15 NOTE — Telephone Encounter (Signed)
Patient needs an appointment. He has not been seen in over 2 years. This was noted on pharmacy request form.

## 2022-07-19 MED ORDER — PANTOPRAZOLE SODIUM 40 MG PO TBEC: 40.0000 mg | DELAYED_RELEASE_TABLET | Freq: Two times a day (BID) | ORAL | 0 refills | Status: DC

## 2022-07-19 NOTE — Telephone Encounter (Addendum)
Patient was scheduled with Dr. Hilarie Fredrickson on 10/6 at 9:30

## 2022-07-19 NOTE — Addendum Note (Signed)
Addended by: Larina Bras on: 07/19/2022 09:17 AM   Modules accepted: Orders

## 2022-07-19 NOTE — Telephone Encounter (Signed)
Rx sent 

## 2022-07-20 MED ORDER — PANTOPRAZOLE SODIUM 40 MG PO TBEC: 40.0000 mg | DELAYED_RELEASE_TABLET | Freq: Two times a day (BID) | ORAL | 0 refills | Status: DC

## 2022-07-20 NOTE — Addendum Note (Signed)
Addended by: Larina Bras on: 07/20/2022 04:57 PM   Modules accepted: Orders

## 2022-07-20 NOTE — Telephone Encounter (Signed)
Inbound call from patient stating that his medication was sent to [facility] and he needs it sent through [facility]. Please advise.

## 2022-08-01 ENCOUNTER — Other Ambulatory Visit: Payer: Self-pay | Admitting: Urology

## 2022-08-01 DIAGNOSIS — R935 Abnormal findings on diagnostic imaging of other abdominal regions, including retroperitoneum: Secondary | ICD-10-CM

## 2022-08-29 DIAGNOSIS — Z85828 Personal history of other malignant neoplasm of skin: Secondary | ICD-10-CM | POA: Diagnosis not present

## 2022-08-29 DIAGNOSIS — D1801 Hemangioma of skin and subcutaneous tissue: Secondary | ICD-10-CM | POA: Diagnosis not present

## 2022-08-29 DIAGNOSIS — L309 Dermatitis, unspecified: Secondary | ICD-10-CM | POA: Diagnosis not present

## 2022-08-29 DIAGNOSIS — L821 Other seborrheic keratosis: Secondary | ICD-10-CM | POA: Diagnosis not present

## 2022-09-06 DIAGNOSIS — N3020 Other chronic cystitis without hematuria: Secondary | ICD-10-CM | POA: Diagnosis not present

## 2022-09-07 ENCOUNTER — Telehealth: Payer: Self-pay | Admitting: Pharmacist

## 2022-09-07 NOTE — Progress Notes (Signed)
    Chronic Care Management Pharmacy Assistant   Name: Charles Castillo.  MRN: [id number] DOB: 1934-11-06   Reason for Encounter: General Adherence Call    Recent office visits:  None  Recent consult visits:  None  Hospital visits:  None in previous 6 months  Medications: Outpatient Encounter Medications as of 09/07/2022  Medication Sig   amLODipine (NORVASC) 5 MG tablet Take 1 tablet (5 mg total) by mouth at bedtime.   colchicine 0.6 MG tablet Take 2 tablets by mouth at first sign of gout flare and may repeat 1 tablet in 1 hour. Due to kidney disease do not take again for at least 2 weeks.   OVER THE COUNTER MEDICATION Take 1 tablet by mouth at bedtime as needed (sleep). [employer] brand sleep aid   pantoprazole (PROTONIX) 40 MG tablet Take 1 tablet (40 mg total) by mouth 2 (two) times daily. PLEASE KEEP 09/2022 APPT   rosuvastatin (CRESTOR) 10 MG tablet Take 1 tablet (10 mg total) by mouth daily.   tamsulosin (FLOMAX) 0.4 MG CAPS capsule Take 2 capsules (0.8 mg total) by mouth daily.   No facility-administered encounter medications on file as of 09/07/2022.   Contacted Charles Castillo. for General Review Call   Chart Review:  Have there been any documented new, changed, or discontinued medications since last visit? No  Has there been any documented recent hospitalizations or ED visits since last visit with Clinical Pharmacist? No   Adherence Review:  Does the Clinical Pharmacist Assistant have access to adherence rates? Yes Adherence rates for STAR metric medications: Rosuvastatin 10 mg last filled 07/21/2022 90 DS Does the patient have >5 day gap between last estimated fill dates for any of the above medications or other medication gaps? No Reason for medication gaps.   Disease State Questions:  Able to connect with Patient? Yes Did patient have any problems with their health recently? No Have you had any admissions or emergency room visits or worsening of your  condition(s) since last visit? No Have you had any visits with new specialists or providers since your last visit? No Have you had any new health care problem(s) since your last visit? No Have you run out of any of your medications since you last spoke with clinical pharmacist? No Are there any medications you are not taking as prescribed? No Are you having any issues or side effects with your medications? No Do you have any other health concerns or questions you want to discuss with your Clinical Pharmacist before your next visit? No Are there any health concerns that you feel we can do a better job addressing? No Are you having any problems with any of the following since the last visit: (select all that apply)  None 12. Any falls since last visit? No 13. Any increased or uncontrolled pain since last visit? No  Care Gaps: Medicare Annual Wellness: Completed 10/14/2021 Hemoglobin A1C: 6.2% on 04/08/2022 Colonoscopy: Completed 12/19/2008  Future Appointments  Date Time Provider [place]  09/21/2022  9:00 AM [facility] MR 3 [id number] [address] [facility]  09/23/2022  9:30 AM Pyrtle, Lajuan Lines, MD [facility]  09/27/2022  9:20 AM Marin Olp, MD [facility]  10/24/2022 10:15 AM [facility] HEALTH COACH [facility]   Star Rating Drugs: Rosuvastatin 10 mg last filled 07/21/2022 90 DS  April D Calhoun, [place] Pharmacist Assistant [phone]

## 2022-09-12 ENCOUNTER — Encounter: Payer: Self-pay | Admitting: *Deleted

## 2022-09-16 DIAGNOSIS — Z23 Encounter for immunization: Secondary | ICD-10-CM | POA: Diagnosis not present

## 2022-09-21 ENCOUNTER — Ambulatory Visit
Admission: RE | Admit: 2022-09-21 | Discharge: 2022-09-21 | Disposition: A | Discharge status: Home / Self Care | Payer: Medicare Other | Source: Ambulatory Visit | Attending: Urology | Admitting: Urology

## 2022-09-21 DIAGNOSIS — C680 Malignant neoplasm of urethra: Secondary | ICD-10-CM | POA: Diagnosis not present

## 2022-09-21 DIAGNOSIS — I129 Hypertensive chronic kidney disease with stage 1 through stage 4 chronic kidney disease, or unspecified chronic kidney disease: Secondary | ICD-10-CM | POA: Diagnosis not present

## 2022-09-21 DIAGNOSIS — N390 Urinary tract infection, site not specified: Secondary | ICD-10-CM | POA: Diagnosis not present

## 2022-09-21 DIAGNOSIS — Z8559 Personal history of malignant neoplasm of other urinary tract organ: Secondary | ICD-10-CM | POA: Diagnosis not present

## 2022-09-21 DIAGNOSIS — N184 Chronic kidney disease, stage 4 (severe): Secondary | ICD-10-CM | POA: Diagnosis not present

## 2022-09-21 DIAGNOSIS — N2581 Secondary hyperparathyroidism of renal origin: Secondary | ICD-10-CM | POA: Diagnosis not present

## 2022-09-21 DIAGNOSIS — N2889 Other specified disorders of kidney and ureter: Secondary | ICD-10-CM | POA: Diagnosis not present

## 2022-09-21 DIAGNOSIS — R935 Abnormal findings on diagnostic imaging of other abdominal regions, including retroperitoneum: Secondary | ICD-10-CM

## 2022-09-21 DIAGNOSIS — N189 Chronic kidney disease, unspecified: Secondary | ICD-10-CM | POA: Diagnosis not present

## 2022-09-21 DIAGNOSIS — E1122 Type 2 diabetes mellitus with diabetic chronic kidney disease: Secondary | ICD-10-CM | POA: Diagnosis not present

## 2022-09-21 DIAGNOSIS — K862 Cyst of pancreas: Secondary | ICD-10-CM | POA: Diagnosis not present

## 2022-09-21 DIAGNOSIS — D631 Anemia in chronic kidney disease: Secondary | ICD-10-CM | POA: Diagnosis not present

## 2022-09-21 LAB — COMPREHENSIVE METABOLIC PANEL
Albumin: 3.9 (ref 3.5–5.0)
Calcium: 9.1 (ref 8.7–10.7)
eGFR: 23

## 2022-09-21 LAB — IRON,TIBC AND FERRITIN PANEL
Ferritin: 504
Iron: 71
UIBC: 202

## 2022-09-21 LAB — CBC: RBC: 4.59 (ref 3.87–5.11)

## 2022-09-21 LAB — VITAMIN D 25 HYDROXY (VIT D DEFICIENCY, FRACTURES): Vit D, 25-Hydroxy: 11.8

## 2022-09-22 LAB — CBC AND DIFFERENTIAL
HCT: 41 (ref 41–53)
Hemoglobin: 13.4 — AB (ref 13.5–17.5)
Platelets: 221 10*3/uL (ref 150–400)
WBC: 7.9

## 2022-09-23 ENCOUNTER — Ambulatory Visit (INDEPENDENT_AMBULATORY_CARE_PROVIDER_SITE_OTHER): Payer: Medicare Other | Admitting: Internal Medicine

## 2022-09-23 ENCOUNTER — Encounter: Payer: Self-pay | Admitting: *Deleted

## 2022-09-23 VITALS — BP 118/60 | HR 86 | Ht 74.0 in | Wt 268.0 lb

## 2022-09-23 DIAGNOSIS — K449 Diaphragmatic hernia without obstruction or gangrene: Secondary | ICD-10-CM | POA: Diagnosis not present

## 2022-09-23 DIAGNOSIS — K2100 Gastro-esophageal reflux disease with esophagitis, without bleeding: Secondary | ICD-10-CM | POA: Diagnosis not present

## 2022-09-23 DIAGNOSIS — D490 Neoplasm of unspecified behavior of digestive system: Secondary | ICD-10-CM

## 2022-09-23 MED ORDER — PANTOPRAZOLE SODIUM 40 MG PO TBEC: 40.0000 mg | DELAYED_RELEASE_TABLET | Freq: Two times a day (BID) | ORAL | 3 refills | Status: DC

## 2022-09-23 NOTE — Progress Notes (Signed)
Subjective:    Patient ID: Charles Lasso., male    DOB: 04/25/1934, 88 y.o.   MRN: [id number]  HPI Charles Castillo is an 88 year old male with a history of GERD with reflux esophagitis, hiatal hernia, gastroduodenitis without H. pylori, fairly recently discovered pancreatic cyst/IPMN, hypertension, diabetes, mild aortic stenosis, sleep apnea and history of urothelial carcinoma who is here for follow-up.  He was last seen in April 2021.  He is here alone today.  He reports that he is feeling quite well.  The pantoprazole is working very well for his heartburn and indigestion.  He is really having no issues with heartburn, dysphagia or odynophagia since starting this medication.  His appetite is good.  He reports his weight is stable.  His bowel movements have been regular without blood or melena.  In urothelial carcinoma surveillance a pancreatic cyst was identified and repeat MRI was performed just yesterday.  He has not yet heard the results of the scan.  He does not have a family history of pancreatic cancer.  No prior history of pancreatitis.  No upper abdominal pain, early satiety, nausea.  No diarrhea, oily or greasy stools.   Review of Systems As per HPI, otherwise negative  Current Medications, Allergies, Past Medical History, Past Surgical History, Family History and Social History were reviewed in [employer] record.    Objective:   Physical Exam BP 118/60   Pulse 86   Ht '6\' 2"'$  (1.88 m)   Wt 268 lb (121.6 kg)   BMI 34.41 kg/m  Gen: awake, alert, NAD HEENT: anicteric Abd: soft, obese, nt, nd, +BS, no hsm Neuro: nonfocal     Latest Ref Rng & Units 04/08/2022   10:53 AM 03/11/2021   12:00 AM 09/17/2020   12:00 AM  CBC  WBC 4.0 - 10.5 K/uL 7.4  11.5     8.6      Hemoglobin 13.0 - 17.0 g/dL 14.0  14.2     14.7      Hematocrit 39.0 - 52.0 % 42.5  43     44      Platelets 150.0 - 400.0 K/uL 174.0  189     178         This result is from an external  source.   CMP     Component Value Date/Time   NA 141 04/08/2022 1053   NA 143 03/11/2021 0000   K 4.9 04/08/2022 1053   CL 109 04/08/2022 1053   CO2 23 04/08/2022 1053   GLUCOSE 124 (H) 04/08/2022 1053   GLUCOSE 119 (H) 10/31/2006 1019   BUN 42 (H) 04/08/2022 1053   BUN 38 (A) 03/11/2021 0000   CREATININE 2.60 (H) 04/08/2022 1053   CALCIUM 9.2 04/08/2022 1053   PROT 6.7 04/08/2022 1053   ALBUMIN 3.9 04/08/2022 1053   AST 20 04/08/2022 1053   ALT 19 04/08/2022 1053   ALKPHOS 67 04/08/2022 1053   BILITOT 0.6 04/08/2022 1053   GFRNONAA 24 09/17/2020 0000   GFRAA 28 09/17/2020 0000     MRI ABDOMEN WITHOUT CONTRAST   TECHNIQUE: Multiplanar multisequence MR imaging was performed without the administration of intravenous contrast.   COMPARISON:  Renal ultrasound June 27, 2018.   FINDINGS: Lower chest: No acute abnormality.   Hepatobiliary: No significant hepatic steatosis. No suspicious hepatic lesion on this noncontrast enhanced examination. Gallbladder is unremarkable. No biliary ductal dilation.   Pancreas: 14 mm well-circumscribed cystic lesion off of the anterior pancreatic tail  on image 17/10. No pancreatic ductal dilation or evidence of acute inflammation.   Spleen: No splenomegaly.   Adrenals/Urinary Tract:  Bilateral adrenal glands appear normal.   Bilateral fluid signal well-circumscribed renal lesions measure up to 2.3 cm in the left kidney on image 19/10 and 18 mm in the right kidney on image 24/10 these are incompletely evaluated without intravenous contrast material but are statistically likely to reflect benign cysts. No hydronephrosis. Bilateral cortical renal thinning. Senescent perinephric stranding.   Stomach/Bowel: Visualized portions within the abdomen are unremarkable.   Vascular/Lymphatic: No pathologically enlarged lymph nodes identified. No abdominal aortic aneurysm demonstrated.   Other:  No significant abdominopelvic free fluid.    Musculoskeletal: No suspicious bone lesions identified.   IMPRESSION: 1. Bilateral fluid signal well-circumscribed renal lesions measure up to 2.3 cm in the left kidney and 1.8 cm in the right kidney, although incompletely evaluated without intravenous contrast material, but are statistically likely to reflect benign cysts. 2. 14 mm well-circumscribed cystic lesion off of the anterior pancreatic tail, likely reflecting a side branch IPMN. Recommend follow up pre and post contrast MRI/MRCP in 1 year.     Electronically Signed   By: Dahlia Bailiff M.D.   On: 09/22/2022 15:24         Assessment & Plan:  88 year old male with a history of GERD with reflux esophagitis, hiatal hernia, gastroduodenitis without H. pylori, fairly recently discovered pancreatic cyst/IPMN, hypertension, diabetes, mild aortic stenosis, sleep apnea and history of urothelial carcinoma who is here for follow-up.  GERD with history of reflux esophagitis and hiatal hernia --symptoms well controlled on pantoprazole.  Given anatomy he would be at increased risk for recurrent esophagitis and symptoms which warrants ongoing therapy.  He is in total agreement. -- Continue pantoprazole 40 mg once daily  2.  Sidebranch IPMN --we reviewed pancreatic cysts as well as IPMN today.  We looked at a graphic today to explain the difference between the main duct and sidebranch cystic lesion.  This is 14 mm in size and stable.  It is overall low risk though there is some low risk of growth and progression to malignancy.  Certainly does not need surgical intervention at this point and hopefully never will. -- I agree with repeat MRI abdomen pancreas protocol with and without contrast in 1 year; I will place a recall for this scan -- He will notify me if he develops any troublesome abdominal symptoms before 1 year  3.  CRC screening --we previously made the decision to discontinue colonoscopy for the purposes of screening based on age.   This is still his wish.  Annual follow-up, sooner if needed  30 minutes total spent today including patient facing time, coordination of care, reviewing medical history/procedures/pertinent radiology studies, and documentation of the encounter.

## 2022-09-23 NOTE — Patient Instructions (Signed)
We have sent the following medications to your pharmacy for you to pick up at your convenience: Pantoprazole  Please follow up with Dr Hilarie Fredrickson in the office in 1 year.  You will be due for an MRI of the pancreas in 1 year. We will schedule you when it gets closer to that time.  _______________________________________________________  If you are age 86 or older, your body mass index should be between 23-30. Your Body mass index is 34.41 kg/m. If this is out of the aforementioned range listed, please consider follow up with your Primary Care Provider.  If you are age 25 or younger, your body mass index should be between 19-25. Your Body mass index is 34.41 kg/m. If this is out of the aformentioned range listed, please consider follow up with your Primary Care Provider.   ________________________________________________________  The [place] [facility] providers would like to encourage you to use [facility] to communicate with providers for non-urgent requests or questions.  Due to long hold times on the telephone, sending your provider a message by [facility] may be a faster and more efficient way to get a response.  Please allow 48 business hours for a response.  Please remember that this is for non-urgent requests.  _______________________________________________________  Due to recent changes in healthcare laws, you may see the results of your imaging and laboratory studies on MyChart before your provider has had a chance to review them.  We understand that in some cases there may be results that are confusing or concerning to you. Not all laboratory results come back in the same time frame and the provider may be waiting for multiple results in order to interpret others.  Please give [place] 48 hours in order for your provider to thoroughly review all the results before contacting the office for clarification of your results.

## 2022-09-27 ENCOUNTER — Ambulatory Visit: Payer: Medicare Other | Admitting: Family Medicine

## 2022-09-30 LAB — BASIC METABOLIC PANEL
BUN: 35 — AB (ref 4–21)
CO2: 18 (ref 13–22)
Chloride: 108 (ref 99–108)
Creatinine: 2.6 — AB (ref 0.6–1.3)
Glucose: 90
Potassium: 4.5 mEq/L (ref 3.5–5.1)
Sodium: 140 (ref 137–147)

## 2022-10-03 DIAGNOSIS — Z23 Encounter for immunization: Secondary | ICD-10-CM | POA: Diagnosis not present

## 2022-10-04 ENCOUNTER — Encounter: Payer: Self-pay | Admitting: Family Medicine

## 2022-10-04 ENCOUNTER — Ambulatory Visit (INDEPENDENT_AMBULATORY_CARE_PROVIDER_SITE_OTHER): Payer: Medicare Other | Admitting: Family Medicine

## 2022-10-04 VITALS — BP 118/60 | HR 89 | Temp 98.3°F | Ht 74.0 in | Wt 267.8 lb

## 2022-10-04 DIAGNOSIS — E1122 Type 2 diabetes mellitus with diabetic chronic kidney disease: Secondary | ICD-10-CM

## 2022-10-04 DIAGNOSIS — E785 Hyperlipidemia, unspecified: Secondary | ICD-10-CM | POA: Diagnosis not present

## 2022-10-04 DIAGNOSIS — E1169 Type 2 diabetes mellitus with other specified complication: Secondary | ICD-10-CM | POA: Diagnosis not present

## 2022-10-04 DIAGNOSIS — I10 Essential (primary) hypertension: Secondary | ICD-10-CM | POA: Diagnosis not present

## 2022-10-04 DIAGNOSIS — N184 Chronic kidney disease, stage 4 (severe): Secondary | ICD-10-CM

## 2022-10-04 DIAGNOSIS — C680 Malignant neoplasm of urethra: Secondary | ICD-10-CM | POA: Diagnosis not present

## 2022-10-04 LAB — POCT GLYCOSYLATED HEMOGLOBIN (HGB A1C): Hemoglobin A1C: 5.9 % — AB (ref 4.0–5.6)

## 2022-10-04 NOTE — Progress Notes (Signed)
Phone [phone] In person visit   Subjective:   Charles Castillo. is a 88 y.o. year old very pleasant male patient who presents for/with See problem oriented charting Chief Complaint  Patient presents with   Follow-up   Diabetes   Hypertension   Past Medical History-  Patient Active Problem List   Diagnosis Date Noted   Mild aortic stenosis 10/25/2018    Priority: High   Type II diabetes mellitus with stage 4 chronic kidney disease ([place]) 09/15/2014    Priority: High   CKD (chronic kidney disease), stage IV ([place]) 03/24/2008    Priority: High   Dysphagia 10/30/2017    Priority: Medium    Gout 05/24/2016    Priority: Medium    Erectile dysfunction 09/15/2014    Priority: Medium    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[place]) 11/04/2009    Priority: Medium    Hyperlipidemia associated with type 2 diabetes mellitus ([place]) 08/27/2007    Priority: Medium    Essential hypertension 07/03/2007    Priority: Medium    Sleep apnea 07/03/2007    Priority: Medium    Former smoker 09/15/2014    Priority: Low   Osteoarthritis 07/03/2007    Priority: Low   COVID-19 virus infection 06/20/2019   Generalized weakness 06/19/2019   Syncope 05/23/2017    Medications- reviewed and updated Current Outpatient Medications  Medication Sig Dispense Refill   amLODipine (NORVASC) 5 MG tablet Take 1 tablet (5 mg total) by mouth at bedtime. 90 tablet 3   colchicine 0.6 MG tablet Take 2 tablets by mouth at first sign of gout flare and may repeat 1 tablet in 1 hour. Due to kidney disease do not take again for at least 2 weeks. 30 tablet 0   OVER THE COUNTER MEDICATION Take 1 tablet by mouth at bedtime as needed (sleep). [employer] brand sleep aid     pantoprazole (PROTONIX) 40 MG tablet Take 1 tablet (40 mg total) by mouth 2 (two) times daily. 180 tablet 3   rosuvastatin (CRESTOR) 10 MG tablet Take 1 tablet (10 mg total) by mouth daily. 90 tablet 3   tamsulosin (FLOMAX) 0.4 MG CAPS capsule Take 2 capsules  (0.8 mg total) by mouth daily. 180 capsule 3   No current facility-administered medications for this visit.     Objective:  BP 118/60   Pulse 89   Temp 98.3 F (36.8 C)   Ht '6\' 2"'$  (1.88 m)   Wt 267 lb 12.8 oz (121.5 kg)   SpO2 98%   BMI 34.38 kg/m  Gen: NAD, resting comfortably CV: RRR stablue systolic murmur Lungs: CTAB no crackles, wheeze, rhonchi Ext: 1+ edema Skin: warm, dry  Health Maintenance Due  Topic Date Due   OPHTHALMOLOGY EXAM  11/11/2020      Assessment and Plan    #Hypertension/CKD IV S: Compliant with amlodipine 5 mg  He follows with Dr. Hollie Salk of nephrology.  No microalbuminuria and nephrology has not recommended ACE inhibitor or arb (slight in 2023 but they still did not start)- just had visit oct 4th BP Readings from Last 3 Encounters:  10/04/22 118/60  09/23/22 118/60  04/08/22 120/60  A/P: HTN- Controlled. Continue current medications.  CKD IV- reports stable- we will try to get records   #Hyperlipidemia S: medication: rosuvastatin 10 mg (unable to increase above this due to renal function- changed February 2023) Lab Results  Component Value Date   CHOL 167 04/08/2022   HDL 51.20 04/08/2022   LDLCALC 94 04/08/2022  LDLDIRECT 123.0 10/12/2018   TRIG 109.0 04/08/2022   CHOLHDL 3 04/08/2022  A/P: Cholesterol is reasonably well controlled with LDL under 100-continue current medication-unable to increase dose of rosuvastatin so would have to change to atorvastatin if needed stronger Rx   #Diabetes S: Has been diet controlled on recent checks Lab Results  Component Value Date   HGBA1C 6.2 04/08/2022   HGBA1C 6.0 09/27/2021   HGBA1C 6.1 03/11/2021  A/P: update a1c poc- he is free to go as long as 6.5 or less.    -gave info for eye exam in our roffice  #Mild aortic stenosis S: Last echocardiogram was June 23, 2019 -no chest pain or SOB or dizziness A/P: he prefers to wait until next year to repeat  % Urethral carcinoma-patient follows with  [facility] previously- now with Dr. Jeffie Pollock only .History of urethrectomy and bilateral lymph node dissection.  #IPMN_ recently imaged by Dr. Jeffie Pollock on follow up- appears stable  Recommended follow up: No follow-ups on file. Future Appointments  Date Time Provider [place]  10/24/2022 10:15 AM [facility] HEALTH COACH [facility]    Lab/Order associations:   ICD-10-CM   1. Type 2 diabetes mellitus with stage 4 chronic kidney disease, without long-term current use of insulin (HCC)  E11.22 POCT HgB A1C   N18.4     2. CKD (chronic kidney disease), stage IV (HCC)  N18.4     3. Essential hypertension  I10     4. Hyperlipidemia associated with type 2 diabetes mellitus ([place])  E11.69    E78.5     5. 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[place])  C68.0       No orders of the defined types were placed in this encounter.   Return precautions advised.  Garret Reddish, MD

## 2022-10-04 NOTE — Patient Instructions (Addendum)
Sign release of information at the check out desk for last labs and visit from Dr. Hollie Salk before you leave  update a1c poc- he is free to go as long as 6.5 or less.    Get your diabetic eye exam scheduled. Could also simply call [phone] and chat with Jake Michaelis to schedule for a diabetic eye exam on 10/06/22 at our office.    Recommended follow up: Return in about 6 months (around 04/05/2023) for followup or sooner if needed.Schedule b4 you leave.

## 2022-10-07 ENCOUNTER — Ambulatory Visit: Payer: Medicare Other | Admitting: Family Medicine

## 2022-10-07 DIAGNOSIS — R3914 Feeling of incomplete bladder emptying: Secondary | ICD-10-CM | POA: Diagnosis not present

## 2022-10-07 DIAGNOSIS — N401 Enlarged prostate with lower urinary tract symptoms: Secondary | ICD-10-CM | POA: Diagnosis not present

## 2022-10-07 DIAGNOSIS — R351 Nocturia: Secondary | ICD-10-CM | POA: Diagnosis not present

## 2022-10-07 DIAGNOSIS — N3020 Other chronic cystitis without hematuria: Secondary | ICD-10-CM | POA: Diagnosis not present

## 2022-10-24 ENCOUNTER — Ambulatory Visit (INDEPENDENT_AMBULATORY_CARE_PROVIDER_SITE_OTHER): Payer: Medicare Other

## 2022-10-24 VITALS — Wt 267.0 lb

## 2022-10-24 DIAGNOSIS — Z0000 Encounter for general adult medical examination without abnormal findings: Secondary | ICD-10-CM | POA: Diagnosis not present

## 2022-10-24 NOTE — Progress Notes (Signed)
I connected with  Charles Castillo. on 10/24/22 by a audio enabled telemedicine application and verified that I am speaking with the correct person using two identifiers.  Patient Location: Home  Provider Location: Office/Clinic  I discussed the limitations of evaluation and management by telemedicine. The patient expressed understanding and agreed to proceed.   Subjective:   Charles Castillo. is a 88 y.o. male who presents for Medicare Annual/Subsequent preventive examination.  Review of Systems     Cardiac Risk Factors include: advanced age (>90mn, >>82women);hypertension;diabetes mellitus;dyslipidemia;male gender;obesity (BMI >30kg/m2)     Objective:    Today's Vitals   10/24/22 1011  Weight: 267 lb (121.1 kg)   Body mass index is 34.28 kg/m.     10/24/2022   10:18 AM 10/11/2021    8:11 AM 06/19/2019    3:52 PM 05/23/2017    3:39 PM 05/23/2017   12:48 PM 06/17/2016   11:56 AM 10/18/2015   11:10 AM  Advanced Directives  Does Patient Have a Medical Advance Directive? Yes Yes No Yes Yes Yes Yes  Type of [place]aramed[place]of [place] will Healthcare Power of [place] will   [place] will  Does patient want to make changes to medical advance directive?    No - Patient declined     Copy of H[place]in Chart? No - copy requested No - copy requested  No - copy requested   No - copy requested  Would patient like information on creating a medical advance directive?   No - Patient declined        Current Medications (verified) Outpatient Encounter Medications as of 10/24/2022  Medication Sig   amLODipine (NORVASC) 5 MG tablet Take 1 tablet (5 mg total) by mouth at bedtime.   Cyanocobalamin (VITAMIN B 12 PO) Take by mouth once a week.   OVER THE COUNTER MEDICATION Take 1 tablet by mouth at bedtime as needed (sleep). [employer] brand sleep aid   pantoprazole (PROTONIX) 40 MG  tablet Take 1 tablet (40 mg total) by mouth 2 (two) times daily.   rosuvastatin (CRESTOR) 10 MG tablet Take 1 tablet (10 mg total) by mouth daily.   tamsulosin (FLOMAX) 0.4 MG CAPS capsule Take 2 capsules (0.8 mg total) by mouth daily.   VITAMIN D PO Take by mouth.   [DISCONTINUED] colchicine 0.6 MG tablet Take 2 tablets by mouth at first sign of gout flare and may repeat 1 tablet in 1 hour. Due to kidney disease do not take again for at least 2 weeks.   No facility-administered encounter medications on file as of 10/24/2022.    Allergies (verified) Patient has no known allergies.   History: Past Medical History:  Diagnosis Date   Arthritis    BPH (benign prostatic hypertrophy) with urinary obstruction    Cancer (HCC)    urethral    CKD (chronic kidney disease), stage III (HCC)    Diet-controlled type 2 diabetes mellitus (HAshby    Duodenitis    peptic   Hiatal hernia    History of cellulitis    2012-- left lower extremitiy   History of urinary tract part removal    2008--  partial urethrectomy for SCC   Hyperlipidemia    Hypertension    Ingrown toenail 10/14/2018   Left big toe s/p removal   Organic impotence    OSA on CPAP    mild to moderate per study 04-19-2011  Pancreatic cyst    Penile lesion    Sleep apnea    CPAP use   Past Surgical History:  Procedure Laterality Date   CYSTO/  BILATERAL RETROGRADE PYELOGRAM/  RESECTION PROSTATIC URETHRAL TUMOR/  EXCISION BIOPSY URETHRAL MEATUS AND MEATOTOMY/  TRANSRECTAL ULTRASOUND PROSTATE BIOPSY'S  12-05-2006   CYSTOSCOPY N/A 06/18/2015   Procedure: CYSTOSCOPY FLEXIBLE;  Surgeon: Irine Seal, MD;  Location: [facility];  Service: Urology;  Laterality: N/A;   PENILE BIOPSY N/A 06/18/2015   Procedure: PENILE BIOPSY;  Surgeon: Irine Seal, MD;  Location[facility];  Service: Urology;  Laterality: N/A;   URETHRECTOMY  05-01-2007   Partial  (squamous cell carcinoma )   Family History  Problem Relation  Age of Onset   Heart failure Father        CHF, no history MI   Stroke Son    Colon cancer Neg Hx    Stomach cancer Neg Hx    Esophageal cancer Neg Hx    Social History   Socioeconomic History   Marital status: Married    Spouse name: Not on file   Number of children: 5   Years of education: Not on file   Highest education level: Not on file  Occupational History   Not on file  Tobacco Use   Smoking status: Former    Packs/day: 1.00    Years: 25.00    Total pack years: 25.00    Types: Cigarettes    Quit date: 12/19/1968    Years since quitting: 53.8   Smokeless tobacco: Never  Vaping Use   Vaping Use: Never used  Substance and Sexual Activity   Alcohol use: No    Alcohol/week: 0.0 standard drinks of alcohol   Drug use: No   Sexual activity: Not Currently  Other Topics Concern   Not on file  Social History Narrative   Married 1999. 5 kids from previous marriage. 2 step kids. 19 grandkids. 9 greatgrandchildren.   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       Wife-hcpoa and this document details wishes. Patient does not want long term life preserving measures but would want resuscitation and intubation if needed.    Social Determinants of Health   Financial Resource Strain: Low Risk  (10/24/2022)   Overall Financial Resource Strain (CARDIA)    Difficulty of Paying Living Expenses: Not hard at all  Food Insecurity: No Food Insecurity (10/24/2022)   Hunger Vital Sign    Worried About Running Out of Food in the Last Year: Never true    Ran Out of Food in the Last Year: Never true  Transportation Needs: No Transportation Needs (10/24/2022)   PRAPARE - Hydrologist (Medical): No    Lack of Transportation (Non-Medical): No  Physical Activity: Inactive (10/24/2022)   Exercise Vital Sign    Days of  Exercise per Week: 0 days    Minutes of Exercise per Session: 0 min  Stress: No Stress Concern Present (10/24/2022)   [place]    Feeling of Stress : Not at all  Social Connections: Moderately Isolated (10/24/2022)   Social Connection and Isolation Panel [NHANES]    Frequency of Communication with Friends and Family: Three times  a week    Frequency of Social Gatherings with Friends and Family: Not on file    Attends Religious Services: Never    Marine scientist or Organizations: No    Attends Music therapist: Never    Marital Status: Married    Tobacco Counseling Counseling given: Not Answered   Clinical Intake:  Pre-visit preparation completed: Yes  Pain : No/denies pain     BMI - recorded: 34.28 Nutritional Status: BMI > 30  Obese Nutritional Risks: None Diabetes: Yes CBG done?: No Did pt. bring in CBG monitor from home?: No  How often do you need to have someone help you when you read instructions, pamphlets, or other written materials from your doctor or pharmacy?: 1 - Never  Diabetic?Nutrition Risk Assessment:  Has the patient had any N/V/D within the last 2 months?  No  Does the patient have any non-healing wounds?  No  Has the patient had any unintentional weight loss or weight gain?  No   Diabetes:  Is the patient diabetic?  Yes  If diabetic, was a CBG obtained today?  No  Did the patient bring in their glucometer from home?  No  How often do you monitor your CBG's? N/A.   Financial Strains and Diabetes Management:  Are you having any financial strains with the device, your supplies or your medication? No .  Does the patient want to be seen by Chronic Care Management for management of their diabetes?  No  Would the patient like to be referred to a Nutritionist or for Diabetic Management?  No   Diabetic Exams:  Diabetic Eye Exam: Completed diabetic clinic  Diabetic  Foot Exam: Completed 10/04/22   Interpreter Needed?: No  Information entered by :: Charlott Rakes, LPN   Activities of Daily Living    10/24/2022   10:20 AM 10/23/2022   10:35 AM  In your present state of health, do you have any difficulty performing the following activities:  Hearing? 1 0  Comment hearing aids at times   Vision? 0 0  Difficulty concentrating or making decisions? 0 0  Walking or climbing stairs? 0 0  Dressing or bathing? 0 0  Doing errands, shopping? 0 0  Preparing Food and eating ? N N  Using the Toilet? N N  In the past six months, have you accidently leaked urine? Y N  Comment frequent urination and accidents at times   Do you have problems with loss of bowel control? N N  Comment constipation   Managing your Medications? N N  Managing your Finances? N N  Housekeeping or managing your Housekeeping? N N    Patient Care Team: Marin Olp, MD as PCP - General (Family Medicine) Madelon Lips, MD as Consulting Physician (Nephrology) Janalyn Harder, MD (Urology) Pyrtle, Lajuan Lines, MD as Consulting Physician (Gastroenterology) Irine Seal, MD as Attending Physician (Urology) Martinique, Amy, MD as Consulting Physician (Dermatology) Madelin Rear, [facility] (Inactive) as Pharmacist (Pharmacist)  Indicate any recent Medical Services you may have received from other than [facility] providers in the past year (date may be approximate).     Assessment:   This is a routine wellness examination for [place].  Hearing/Vision screen Hearing Screening - Comments:: Pt wears hearing aids at times  Vision Screening - Comments:: Pt follows up with diabetic eye exams with retina clinic  Dietary issues and exercise activities discussed: Current Exercise Habits: The patient does not participate in regular exercise at present   Goals  Addressed             This Visit's Progress    Patient Stated       Patient Stated       Stay active        Depression Screen     10/24/2022   10:17 AM 10/11/2021    8:10 AM 09/30/2021   10:46 AM 09/28/2020   11:06 AM 03/26/2020   10:32 AM 09/26/2019   10:34 AM 01/14/2019   10:52 AM  PHQ 2/9 Scores  PHQ - 2 Score 0 0 0 0 0 0 0  PHQ- 9 Score    0       Fall Risk    10/24/2022   10:20 AM 10/23/2022   10:35 AM 10/11/2021    8:12 AM 09/30/2021   10:46 AM 09/28/2020   11:06 AM  Fall Risk   Falls in the past year? 0 0 0 0 0  Number falls in past yr: 0 0 0 0 0  Injury with Fall? 0 0 0 0 0  Risk for fall due to :   Impaired vision No Fall Risks   Follow up Falls prevention discussed  Falls prevention discussed Falls evaluation completed     FALL RISK PREVENTION PERTAINING TO THE HOME:  Any stairs in or around the home? Yes  If so, are there any without handrails? No  Home free of loose throw rugs in walkways, pet beds, electrical cords, etc? Yes  Adequate lighting in your home to reduce risk of falls? Yes   ASSISTIVE DEVICES UTILIZED TO PREVENT FALLS:  Life alert? No  Use of a cane, walker or w/c? No  Grab bars in the bathroom? Yes  Shower chair or bench in shower? Yes  Elevated toilet seat or a handicapped toilet? No   TIMED UP AND GO:  Was the test performed? No .   Cognitive Function:        10/24/2022   10:21 AM 10/11/2021    8:14 AM  6CIT Screen  What Year? 0 points 0 points  What month? 0 points 0 points  What time? 0 points 0 points  Count back from 20 0 points 0 points  Months in reverse 0 points 0 points  Repeat phrase 0 points 0 points  Total Score 0 points 0 points    Immunizations Immunization History  Administered Date(s) Administered   Influenza Split 09/20/2012   Influenza Whole 12/19/2005, 09/30/2009, 09/10/2010   Influenza, High Dose Seasonal PF 09/12/2017, 09/24/2018, 08/20/2019, 09/10/2020, 09/16/2022   Influenza,inj,Quad PF,6+ Mos 09/15/2014   Influenza,inj,Quad PF,6-35 Mos 09/18/2013   Influenza-Unspecified 09/25/2015, 10/04/2016, 10/10/2021   PFIZER(Purple  Top)SARS-COV-2 Vaccination 12/31/2019, 01/20/2020, 09/14/2020, 04/21/2021, 11/26/2021   Pneumococcal Conjugate-13 03/19/2015   Pneumococcal Polysaccharide-23 09/18/2004   Td 11/01/2010    TDAP status: Due, Education has been provided regarding the importance of this vaccine. Advised may receive this vaccine at local pharmacy or Health Dept. Aware to provide a copy of the vaccination record if obtained from local pharmacy or Health Dept. Verbalized acceptance and understanding.  Flu Vaccine status: Up to date  Pneumococcal vaccine status: Up to date  Covid-19 vaccine status: Completed vaccines  Qualifies for Shingles Vaccine? No    Screening Tests Health Maintenance  Topic Date Due   OPHTHALMOLOGY EXAM  11/11/2020   COVID-19 Vaccine (6 - Pfizer risk series) 01/21/2022   Zoster Vaccines- Shingrix (1 of 2) 01/04/2023 (Originally 10/16/1953)   TETANUS/TDAP  10/05/2023 (Originally 11/01/2020)   HEMOGLOBIN  A1C  04/05/2023   FOOT EXAM  10/05/2023   Medicare Annual Wellness (AWV)  10/25/2023   Pneumonia Vaccine 24+ Years old  Completed   INFLUENZA VACCINE  Completed   HPV VACCINES  Aged Out    Health Maintenance  Health Maintenance Due  Topic Date Due   OPHTHALMOLOGY EXAM  11/11/2020   COVID-19 Vaccine (6 - Pfizer risk series) 01/21/2022    Colorectal cancer screening: No longer required.    Additional Screening:  Vision Screening: Recommended annual ophthalmology exams for early detection of glaucoma and other disorders of the eye. Is the patient up to date with their annual eye exam?  Yes  Who is the provider or what is the name of the office in which the patient attends annual eye exams? [facility] clinic  If pt is not established with a provider, would they like to be referred to a provider to establish care? No .   Dental Screening: Recommended annual dental exams for proper oral hygiene  Community Resource Referral / Chronic Care Management: CRR required this  visit?  No   CCM required this visit?  No      Plan:     I have personally reviewed and noted the following in the patient's chart:   Medical and social history Use of alcohol, tobacco or illicit drugs  Current medications and supplements including opioid prescriptions. Patient is not currently taking opioid prescriptions. Functional ability and status Nutritional status Physical activity Advanced directives List of other physicians Hospitalizations, surgeries, and ER visits in previous 12 months Vitals Screenings to include cognitive, depression, and falls Referrals and appointments  In addition, I have reviewed and discussed with patient certain preventive protocols, quality metrics, and best practice recommendations. A written personalized care plan for preventive services as well as general preventive health recommendations were provided to patient.     Willette Brace, LPN   [id number]/[id number]   Nurse Notes: Pt has complaint of constipation for up to ten days without having A bowel movement unless he takes a laxative. He also stated he is still have frequency in urination please advise

## 2022-10-24 NOTE — Patient Instructions (Signed)
Mr. Pilot , Thank you for taking time to come for your Medicare Wellness Visit. I appreciate your ongoing commitment to your health goals. Please review the following plan we discussed and let me know if I can assist you in the future.   These are the goals we discussed:  Goals      Patient Stated     None at this time      Patient Stated     Patient Stated     Stay active      Weight < 260 lb (117.935 kg)     Cut back on portions;   Controllable  risk for heart disease reviewed for goal setting: Includes; BP control <140/80; monitoring renal disease; obesity (BMI >30); sedentary lifestyle Educated regarding Metabolic syndrome / Excess weight around the waist;  Waist circumference for Men >40  Triglycerides > 150 (60) HDL < 50 (49.9) BP > 130/85  Glucose > 100 Plan Lifestyle changes  Heart Healthy Diet; less sugar  Mediterranean Diet: eating primarily plant-based food such as fruits and vegetables, whole grains, legumes and nuts; replacing butter with healthy fats such as olive oil and canola oil Using herbs and spices instead of salt to flavor food Limiting red meat to no more than a few times a month Eating fish and poultry at least 2 times a week Getting plenty of exercise          This is a list of the screening recommended for you and due dates:  Health Maintenance  Topic Date Due   Eye exam for diabetics  11/11/2020   COVID-19 Vaccine (6 - Pfizer risk series) 01/21/2022   Zoster (Shingles) Vaccine (1 of 2) 01/04/2023*   Tetanus Vaccine  10/05/2023*   Hemoglobin A1C  04/05/2023   Complete foot exam   10/05/2023   Medicare Annual Wellness Visit  10/25/2023   Pneumonia Vaccine  Completed   Flu Shot  Completed   HPV Vaccine  Aged Out  *Topic was postponed. The date shown is not the original due date.    Advanced directives: Please bring a copy of your health care power of attorney and living will to the office at your convenience.  Conditions/risks  identified: stay active   Next appointment: Follow up in one year for your annual wellness visit.   Preventive Care 52 Years and Older, Male  Preventive care refers to lifestyle choices and visits with your health care provider that can promote health and wellness. What does preventive care include? A yearly physical exam. This is also called an annual well check. Dental exams once or twice a year. Routine eye exams. Ask your health care provider how often you should have your eyes checked. Personal lifestyle choices, including: Daily care of your teeth and gums. Regular physical activity. Eating a healthy diet. Avoiding tobacco and drug use. Limiting alcohol use. Practicing safe sex. Taking low doses of aspirin every day. Taking vitamin and mineral supplements as recommended by your health care provider. What happens during an annual well check? The services and screenings done by your health care provider during your annual well check will depend on your age, overall health, lifestyle risk factors, and family history of disease. Counseling  Your health care provider may ask you questions about your: Alcohol use. Tobacco use. Drug use. Emotional well-being. Home and relationship well-being. Sexual activity. Eating habits. History of falls. Memory and ability to understand (cognition). Work and work Statistician. Screening  You may have the following tests  or measurements: Height, weight, and BMI. Blood pressure. Lipid and cholesterol levels. These may be checked every 5 years, or more frequently if you are over 62 years old. Skin check. Lung cancer screening. You may have this screening every year starting at age 60 if you have a 30-pack-year history of smoking and currently smoke or have quit within the past 15 years. Fecal occult blood test (FOBT) of the stool. You may have this test every year starting at age 43. Flexible sigmoidoscopy or colonoscopy. You may have a  sigmoidoscopy every 5 years or a colonoscopy every 10 years starting at age 34. Prostate cancer screening. Recommendations will vary depending on your family history and other risks. Hepatitis C blood test. Hepatitis B blood test. Sexually transmitted disease (STD) testing. Diabetes screening. This is done by checking your blood sugar (glucose) after you have not eaten for a while (fasting). You may have this done every 1-3 years. Abdominal aortic aneurysm (AAA) screening. You may need this if you are a current or former smoker. Osteoporosis. You may be screened starting at age 86 if you are at high risk. Talk with your health care provider about your test results, treatment options, and if necessary, the need for more tests. Vaccines  Your health care provider may recommend certain vaccines, such as: Influenza vaccine. This is recommended every year. Tetanus, diphtheria, and acellular pertussis (Tdap, Td) vaccine. You may need a Td booster every 10 years. Zoster vaccine. You may need this after age 58. Pneumococcal 13-valent conjugate (PCV13) vaccine. One dose is recommended after age 64. Pneumococcal polysaccharide (PPSV23) vaccine. One dose is recommended after age 69. Talk to your health care provider about which screenings and vaccines you need and how often you need them. This information is not intended to replace advice given to you by your health care provider. Make sure you discuss any questions you have with your health care provider. Document Released: 01/01/2016 Document Revised: 08/24/2016 Document Reviewed: 10/06/2015 Elsevier Interactive Patient Education  2017 [place] Prevention in the Home Falls can cause injuries. They can happen to people of all ages. There are many things you can do to make your home safe and to help prevent falls. What can I do on the outside of my home? Regularly fix the edges of walkways and driveways and fix any cracks. Remove anything  that might make you trip as you walk through a door, such as a raised step or threshold. Trim any bushes or trees on the path to your home. Use bright outdoor lighting. Clear any walking paths of anything that might make someone trip, such as rocks or tools. Regularly check to see if handrails are loose or broken. Make sure that both sides of any steps have handrails. Any raised decks and porches should have guardrails on the edges. Have any leaves, snow, or ice cleared regularly. Use sand or salt on walking paths during winter. Clean up any spills in your garage right away. This includes oil or grease spills. What can I do in the bathroom? Use night lights. Install grab bars by the toilet and in the tub and shower. Do not use towel bars as grab bars. Use non-skid mats or decals in the tub or shower. If you need to sit down in the shower, use a plastic, non-slip stool. Keep the floor dry. Clean up any water that spills on the floor as soon as it happens. Remove soap buildup in the tub or shower regularly. Attach  bath mats securely with double-sided non-slip rug tape. Do not have throw rugs and other things on the floor that can make you trip. What can I do in the bedroom? Use night lights. Make sure that you have a light by your bed that is easy to reach. Do not use any sheets or blankets that are too big for your bed. They should not hang down onto the floor. Have a firm chair that has side arms. You can use this for support while you get dressed. Do not have throw rugs and other things on the floor that can make you trip. What can I do in the kitchen? Clean up any spills right away. Avoid walking on wet floors. Keep items that you use a lot in easy-to-reach places. If you need to reach something above you, use a strong step stool that has a grab bar. Keep electrical cords out of the way. Do not use floor polish or wax that makes floors slippery. If you must use wax, use non-skid floor  wax. Do not have throw rugs and other things on the floor that can make you trip. What can I do with my stairs? Do not leave any items on the stairs. Make sure that there are handrails on both sides of the stairs and use them. Fix handrails that are broken or loose. Make sure that handrails are as long as the stairways. Check any carpeting to make sure that it is firmly attached to the stairs. Fix any carpet that is loose or worn. Avoid having throw rugs at the top or bottom of the stairs. If you do have throw rugs, attach them to the floor with carpet tape. Make sure that you have a light switch at the top of the stairs and the bottom of the stairs. If you do not have them, ask someone to add them for you. What else can I do to help prevent falls? Wear shoes that: Do not have high heels. Have rubber bottoms. Are comfortable and fit you well. Are closed at the toe. Do not wear sandals. If you use a stepladder: Make sure that it is fully opened. Do not climb a closed stepladder. Make sure that both sides of the stepladder are locked into place. Ask someone to hold it for you, if possible. Clearly mark and make sure that you can see: Any grab bars or handrails. First and last steps. Where the edge of each step is. Use tools that help you move around (mobility aids) if they are needed. These include: Canes. Walkers. Scooters. Crutches. Turn on the lights when you go into a dark area. Replace any light bulbs as soon as they burn out. Set up your furniture so you have a clear path. Avoid moving your furniture around. If any of your floors are uneven, fix them. If there are any pets around you, be aware of where they are. Review your medicines with your doctor. Some medicines can make you feel dizzy. This can increase your chance of falling. Ask your doctor what other things that you can do to help prevent falls. This information is not intended to replace advice given to you by your  health care provider. Make sure you discuss any questions you have with your health care provider. Document Released: 10/01/2009 Document Revised: 05/12/2016 Document Reviewed: 01/09/2015 Elsevier Interactive Patient Education  2017 [employer].

## 2022-10-25 ENCOUNTER — Telehealth: Payer: Self-pay

## 2022-10-25 NOTE — Telephone Encounter (Signed)
Pt given instruction you provided with clear verbal and written understanding and will follow up in 10 days if not better. He also will follow up with urologist.

## 2022-10-25 NOTE — Telephone Encounter (Signed)
Perfect thanks

## 2022-12-20 ENCOUNTER — Encounter: Payer: Self-pay | Admitting: Family Medicine

## 2022-12-20 ENCOUNTER — Other Ambulatory Visit: Payer: Self-pay

## 2022-12-20 MED ORDER — ROSUVASTATIN CALCIUM 10 MG PO TABS: 10.0000 mg | ORAL_TABLET | Freq: Every day | ORAL | 3 refills | Status: DC

## 2022-12-20 MED ORDER — TAMSULOSIN HCL 0.4 MG PO CAPS: 0.8000 mg | ORAL_CAPSULE | Freq: Every day | ORAL | 3 refills | Status: DC

## 2022-12-20 MED ORDER — AMLODIPINE BESYLATE 5 MG PO TABS: 5.0000 mg | ORAL_TABLET | Freq: Every day | ORAL | 3 refills | Status: DC

## 2022-12-21 ENCOUNTER — Telehealth: Payer: Self-pay | Admitting: Pharmacist

## 2022-12-21 NOTE — Progress Notes (Signed)
Reason for Encounter: General adherence update   Contacted patient on 12/21/2022 for general disease state and medication adherence call.   Recent office visits:  10/04/2022 OV (PCP) Marin Olp, MD; -unable to increase dose of rosuvastatin so would have to change to atorvastatin if needed stronger Rx   Recent consult visits:  09/23/2022 OV Gertie Fey) Pyrtle, Lajuan Lines, MD; no medication changes indicated.  Hospital visits:  None in previous 6 months  Medications: Outpatient Encounter Medications as of 12/21/2022  Medication Sig   amLODipine (NORVASC) 5 MG tablet Take 1 tablet (5 mg total) by mouth at bedtime.   Cyanocobalamin (VITAMIN B 12 PO) Take by mouth once a week.   OVER THE COUNTER MEDICATION Take 1 tablet by mouth at bedtime as needed (sleep). [employer] brand sleep aid   pantoprazole (PROTONIX) 40 MG tablet Take 1 tablet (40 mg total) by mouth 2 (two) times daily.   rosuvastatin (CRESTOR) 10 MG tablet Take 1 tablet (10 mg total) by mouth daily.   tamsulosin (FLOMAX) 0.4 MG CAPS capsule Take 2 capsules (0.8 mg total) by mouth daily.   VITAMIN D PO Take by mouth.   No facility-administered encounter medications on file as of 12/21/2022.    Recent vitals BP Readings from Last 3 Encounters:  10/04/22 118/60  09/23/22 118/60  04/08/22 120/60   Pulse Readings from Last 3 Encounters:  10/04/22 89  09/23/22 86  04/08/22 72   Wt Readings from Last 3 Encounters:  10/24/22 267 lb (121.1 kg)  10/04/22 267 lb 12.8 oz (121.5 kg)  09/23/22 268 lb (121.6 kg)   BMI Readings from Last 3 Encounters:  10/24/22 34.28 kg/m  10/04/22 34.38 kg/m  09/23/22 34.41 kg/m    Recent lab results    Component Value Date/Time   NA 140 09/21/2022 0000   K 4.5 09/21/2022 0000   CL 108 09/21/2022 0000   CO2 18 09/21/2022 0000   GLUCOSE 124 (H) 04/08/2022 1053   GLUCOSE 119 (H) 10/31/2006 1019   BUN 35 (A) 09/21/2022 0000   CREATININE 2.6 (A) 09/21/2022 0000   CREATININE 2.60 (H) 04/08/2022  1053   CALCIUM 9.1 09/21/2022 0000    Lab Results  Component Value Date   CREATININE 2.6 (A) 09/21/2022   GFR 21.81 (L) 04/08/2022   EGFR 23 09/21/2022   GFRNONAA 24 09/17/2020   GFRAA 28 09/17/2020   Lab Results  Component Value Date/Time   HGBA1C 5.9 (A) 10/04/2022 10:19 AM   HGBA1C 6.2 04/08/2022 10:53 AM   HGBA1C 6.0 09/27/2021 12:00 AM   HGBA1C 6.1 03/11/2021 12:00 AM   MICROALBUR 14.6 (H) 04/08/2022 10:53 AM   MICROALBUR 41.2 (H) 03/29/2021 11:13 AM    Lab Results  Component Value Date   CHOL 167 04/08/2022   HDL 51.20 04/08/2022   LDLCALC 94 04/08/2022   LDLDIRECT 123.0 10/12/2018   TRIG 109.0 04/08/2022   CHOLHDL 3 04/08/2022     What concerns do you have about your medications? Nonw  The patient denies side effects with their medications.   How often do you forget or accidentally miss a dose? Never  Are you having any problems getting your medications from your pharmacy? No  Has the cost of your medications been a concern? No If yes, what medication and is patient assistance available or has it been applied for?  Since last visit with PharmD, no interventions have been made.   The patient has not had an ED visit since last contact.   The  patient denies problems with their health.   Patient denies concerns or questions for Leata Mouse, PharmD at this time.   Counseled patient on: Access to carecoordination team for any cost, medication or pharmacy concerns.   Care Gaps: Annual wellness visit in last year? Yes  If Diabetic: Last eye exam / retinopathy screening:10/06/2022 Last diabetic foot exam: 10/04/2022 Last UACR: 04/08/2022  Star Rating Drugs:  Rosuvastatin 10 mg last filled 01/10/2023 90 DS   Future Appointments  Date Time Provider [place]  04/05/2023  9:00 AM Marin Olp, MD [facility]  10/30/2023  9:15 AM [facility] COACH [facility]

## 2022-12-26 ENCOUNTER — Telehealth: Payer: Self-pay | Admitting: Family Medicine

## 2022-12-26 NOTE — Telephone Encounter (Signed)
EMS was called/ to go to ED   Patient Name: Charles Castillo. Gender: Male DOB: Dec 17, 1934 Age: 89 Y 2 M 11 D Return Phone Number: [phone] (Primary), [phone] (Secondary) Address: City/ State/ Zip: [place]  [address] Client [facility] [place] [facility] [place] [facility] [place] [facility] Provider Garret Reddish- MD Contact Type Call Who Is Calling Patient / Member / Family / Caregiver Call Type Triage / Clinical Caller Name [name] Relationship To Patient Spouse Return Phone Number [phone] (Secondary) Chief Complaint Vomiting Reason for Call Symptomatic / Request for [name] states her husband was shaking from the waist up, and vomiting no blood in and has urinated in the past 8 hours. [place] Not Listed [place] Translation No Nurse Assessment Nurse: Casey Burkitt, RN, Abran Earle Date/Time (Eastern Time): 12/26/2022 12:44:41 PM Confirm and document reason for call. If symptomatic, describe symptoms. ---Caller states he was eating breakfast, starting shaking and throwing up Does the patient have any new or worsening symptoms? ---Yes Will a triage be completed? ---Yes Related visit to physician within the last 2 weeks? ---No Does the PT have any chronic conditions? (i.e. diabetes, asthma, this includes High risk factors for pregnancy, etc.) ---Yes List chronic conditions. ---htn Is this a behavioral health or substance abuse call? ---No Guidelines Guideline Title Affirmed Question Affirmed Notes Nurse Date/Time (Eastern Time) Vomiting [1] MODERATE vomiting (e.g., 3 - 5 times/day) AND [2] age > 48 years Casey Burkitt, Aura Fey 12/26/2022 12:46:48 PM PLEASE NOTE: All timestamps contained within this report are represented as [place] Standard Time. CONFIDENTIALTY NOTICE: This fax transmission is intended only for the addressee. It contains information that is legally  privileged, confidential or otherwise protected from use or disclosure. If you are not the intended recipient, you are strictly prohibited from reviewing, disclosing, copying using or disseminating any of this information or taking any action in reliance on or regarding this information. If you have received this fax in error, please notify us immediately by telephone so that we can arrange for its return to [place]. Phone: [phone], Toll-Free: [phone], Fax: [phone] Page: 2 of 2 Call Id: [id number] [place]. Time Eilene Ghazi Time) Disposition Final User 12/26/2022 12:51:54 PM Go to ED Now (or PCP triage) Yes Casey Burkitt, RN, Abran Athanasios 12/26/2022 12:57:38 PM 911 Outcome Documentation Youman, RN, Abran Sahej Reason: Safer for EMS to pick up caller. Final Disposition 12/26/2022 12:51:54 PM Go to ED Now (or PCP triage) Yes Casey Burkitt, RN, Marnette Burgess Disagree/Comply Comply Caller Understands Yes PreDisposition InappropriateToAsk Care Advice Given Per Guideline * IF NO PCP (PRIMARY CARE PROVIDER) SECOND-LEVEL TRIAGE: You need to be seen within the next hour. Go to the [facility] at _____________ [place] as soon as you can. GO TO ED NOW (OR PCP TRIAGE): BRING A BUCKET IN CASE OF VOMITING: * You may wish to bring a bucket, pan, or plastic bag with you in case there is more vomiting during the drive. BRING MEDICINES: * Please bring a list of your current medicines when you go to see the doctor. * It is also a good idea to bring the pill bottles too. This will help the doctor to make certain you are taking the right medicines and the right dose. CARE ADVICE per Vomiting (Adult) guideline. Comments User: Shella Maxim, RN Date/Time Eilene Ghazi Time): 12/26/2022 12:57:23 PM Had accept GO TO ED NOW (PCP TRIAGE DISPO) but upgraded to EMS getting patient, 911 outcome documentation dispo completed. User: Shella Maxim,  RN Date/Time Eilene Ghazi Time): 12/26/2022 12:57:54 PM EMS on the way caller states from  [place]

## 2022-12-26 NOTE — Telephone Encounter (Signed)
Please schedule f/u with pt regarding this.

## 2022-12-26 NOTE — Telephone Encounter (Signed)
Caller states: -Patient has been experiencing periods of shaking from the waist up  - Patient had a shaking episode today but started throwing up, which has never happened before - Denies fever or other symptoms   Caller and patient have been transferred to triage.

## 2022-12-26 NOTE — Telephone Encounter (Deleted)
Triage advised patient to go to ED now.   Patient Name: Charles Castillo. Gender: Male DOB: 1934/11/20 Age: 89 Y 2 M 11 D Return Phone Number: [phone] (Primary), [phone] (Secondary) Address: City/ State/ Zip: [place]  [address] Client [facility] [place] [facility] [place] [facility] [place] Day Provider Charles Castillo- MD Contact Type Call Who Is Calling Patient / Member / Family / Caregiver Call Type Triage / Clinical Caller Name Charles Castillo Relationship To Patient Spouse Return Phone Number [phone] (Secondary) Chief Complaint Vomiting Reason for Call Symptomatic / Request for Charles Castillo states her husband was shaking from the waist up, and vomiting no blood in and has urinated in the past 8 hours. [place] Not Listed [place] Translation No Nurse Assessment Nurse: Charles Burkitt, RN, Charles Castillo Date/Time (Eastern Time): 12/26/2022 12:44:41 PM Confirm and document reason for call. If symptomatic, describe symptoms. ---Caller states he was eating breakfast, starting shaking and throwing up Does the patient have any new or worsening symptoms? ---Yes Will a triage be completed? ---Yes Related visit to physician within the last 2 weeks? ---No Does the PT have any chronic conditions? (i.e. diabetes, asthma, this includes High risk factors for pregnancy, etc.) ---Yes List chronic conditions. ---htn Is this a behavioral health or substance abuse call? ---No Guidelines Guideline Title Affirmed Question Affirmed Notes Nurse Date/Time (Eastern Time) Vomiting [1] MODERATE vomiting (e.g., 3 - 5 times/day) AND [2] age > 65 years Charles Castillo, Charles Castillo 12/26/2022 12:46:48 PM PLEASE NOTE: All timestamps contained within this report are represented as [place] Standard Time. CONFIDENTIALTY NOTICE: This fax transmission is intended only for the addressee. It contains information that is  legally privileged, confidential or otherwise protected from use or disclosure. If you are not the intended recipient, you are strictly prohibited from reviewing, disclosing, copying using or disseminating any of this information or taking any action in reliance on or regarding this information. If you have received this fax in error, please notify us immediately by telephone so that we can arrange for its return to [place]. Phone: [phone], Toll-Free: [phone], Fax: [phone] Page: 2 of 2 Call Id: [id number] [place]. Time Charles Castillo Time) Disposition Final User 12/26/2022 12:51:54 PM Go to ED Now (or PCP triage) Yes Charles Burkitt, RN, Charles Charles Castillo 12/26/2022 12:57:38 PM 911 Outcome Documentation Youman, RN, Charles Charles Castillo Reason: Safer for EMS to pick up caller. Final Disposition 12/26/2022 12:51:54 PM Go to ED Now (or PCP triage) Yes Charles Burkitt, RN, Charles Castillo Disagree/Comply Comply Caller Understands Yes PreDisposition InappropriateToAsk Care Advice Given Per Guideline * IF NO PCP (PRIMARY CARE PROVIDER) SECOND-LEVEL TRIAGE: You need to be seen within the next hour. Go to the [facility] at _____________ [place] as soon as you can. GO TO ED NOW (OR PCP TRIAGE): BRING A BUCKET IN CASE OF VOMITING: * You may wish to bring a bucket, pan, or plastic bag with you in case there is more vomiting during the drive. BRING MEDICINES: * Please bring a list of your current medicines when you go to see the doctor. * It is also a good idea to bring the pill bottles too. This will help the doctor to make certain you are taking the right medicines and the right dose. CARE ADVICE per Vomiting (Adult) guideline. Comments User: Charles Maxim, RN Date/Time Charles Castillo Time): 12/26/2022 12:57:23 PM Had accept GO TO ED NOW (PCP TRIAGE DISPO) but upgraded to EMS getting patient, 911 outcome documentation dispo completed. User: Charles Charles Castillo,  Charles Burkitt, RN Date/Time Charles Castillo Time): 12/26/2022 12:57:54 PM EMS on the way caller states from  [place]

## 2022-12-28 DIAGNOSIS — R8271 Bacteriuria: Secondary | ICD-10-CM | POA: Diagnosis not present

## 2022-12-28 DIAGNOSIS — N3000 Acute cystitis without hematuria: Secondary | ICD-10-CM | POA: Diagnosis not present

## 2023-01-09 ENCOUNTER — Other Ambulatory Visit: Payer: Self-pay | Admitting: Family Medicine

## 2023-01-12 DIAGNOSIS — N401 Enlarged prostate with lower urinary tract symptoms: Secondary | ICD-10-CM | POA: Diagnosis not present

## 2023-01-12 DIAGNOSIS — R3915 Urgency of urination: Secondary | ICD-10-CM | POA: Diagnosis not present

## 2023-01-12 DIAGNOSIS — N3000 Acute cystitis without hematuria: Secondary | ICD-10-CM | POA: Diagnosis not present

## 2023-01-12 DIAGNOSIS — R3914 Feeling of incomplete bladder emptying: Secondary | ICD-10-CM | POA: Diagnosis not present

## 2023-01-17 ENCOUNTER — Other Ambulatory Visit: Payer: Self-pay | Admitting: Family Medicine

## 2023-02-02 ENCOUNTER — Encounter: Payer: Self-pay | Admitting: Family Medicine

## 2023-02-06 ENCOUNTER — Telehealth: Payer: Self-pay | Admitting: Family Medicine

## 2023-02-06 NOTE — Telephone Encounter (Signed)
  LAST APPOINTMENT DATE:  10/04/22  NEXT APPOINTMENT DATE: 04/05/23  MEDICATION:  colchicine   Is the patient out of medication? Yes  [id number]: [phone] Fax: [phone]     Let patient know to contact pharmacy at the end of the day to make sure medication is ready.  Please notify patient to allow 48-72 hours to process

## 2023-02-07 MED ORDER — COLCHICINE 0.6 MG PO TABS: ORAL_TABLET | ORAL | 0 refills | Status: DC

## 2023-02-07 NOTE — Telephone Encounter (Signed)
I sent this in but much lower dose due to kidney function than in the past Honestly strongly considered as needed treatment with prednisone but he would need a visit for this to discuss

## 2023-02-07 NOTE — Addendum Note (Signed)
Addended by: Marin Olp on: 02/07/2023 09:01 PM   Modules accepted: Orders

## 2023-02-07 NOTE — Telephone Encounter (Signed)
See below

## 2023-03-06 ENCOUNTER — Telehealth: Payer: Self-pay | Admitting: Internal Medicine

## 2023-03-06 MED ORDER — PANTOPRAZOLE SODIUM 40 MG PO TBEC: 40.0000 mg | DELAYED_RELEASE_TABLET | Freq: Two times a day (BID) | ORAL | 0 refills | Status: DC

## 2023-03-06 NOTE — Telephone Encounter (Signed)
Rx sent 

## 2023-03-06 NOTE — Telephone Encounter (Signed)
PT is calling to get refill on Protonix. Should be sent to [facility]. Please advise.

## 2023-03-08 DIAGNOSIS — N401 Enlarged prostate with lower urinary tract symptoms: Secondary | ICD-10-CM | POA: Diagnosis not present

## 2023-03-08 DIAGNOSIS — N3020 Other chronic cystitis without hematuria: Secondary | ICD-10-CM | POA: Diagnosis not present

## 2023-03-08 DIAGNOSIS — R3912 Poor urinary stream: Secondary | ICD-10-CM | POA: Diagnosis not present

## 2023-03-08 DIAGNOSIS — N3941 Urge incontinence: Secondary | ICD-10-CM | POA: Diagnosis not present

## 2023-03-08 DIAGNOSIS — R972 Elevated prostate specific antigen [PSA]: Secondary | ICD-10-CM | POA: Diagnosis not present

## 2023-03-08 DIAGNOSIS — N35011 Post-traumatic bulbous urethral stricture: Secondary | ICD-10-CM | POA: Diagnosis not present

## 2023-03-10 ENCOUNTER — Other Ambulatory Visit: Payer: Self-pay | Admitting: Urology

## 2023-03-22 DIAGNOSIS — E1122 Type 2 diabetes mellitus with diabetic chronic kidney disease: Secondary | ICD-10-CM | POA: Diagnosis not present

## 2023-03-22 DIAGNOSIS — I129 Hypertensive chronic kidney disease with stage 1 through stage 4 chronic kidney disease, or unspecified chronic kidney disease: Secondary | ICD-10-CM | POA: Diagnosis not present

## 2023-03-22 DIAGNOSIS — E785 Hyperlipidemia, unspecified: Secondary | ICD-10-CM | POA: Diagnosis not present

## 2023-03-22 DIAGNOSIS — N184 Chronic kidney disease, stage 4 (severe): Secondary | ICD-10-CM | POA: Diagnosis not present

## 2023-03-22 DIAGNOSIS — C680 Malignant neoplasm of urethra: Secondary | ICD-10-CM | POA: Diagnosis not present

## 2023-03-22 DIAGNOSIS — K862 Cyst of pancreas: Secondary | ICD-10-CM | POA: Diagnosis not present

## 2023-03-24 ENCOUNTER — Telehealth: Payer: Self-pay | Admitting: Family Medicine

## 2023-03-24 NOTE — Telephone Encounter (Signed)
Pt scheduled ov for 03/28/23.

## 2023-03-24 NOTE — Telephone Encounter (Signed)
Patient states for the last couple of weeks he has been experiencing lower left abdominal pain, mostly when getting out of a chair or bed (when using stomach muscles). States the area is very painful and tender to the touch.  Transferred to Triage.

## 2023-03-24 NOTE — Telephone Encounter (Signed)
Patient advised to Go to ED Now  Patient Name: Charles Castillo. Gender: Male DOB: 06/01/1934 Age: 89 Y 5 M 8 D Return Phone Number: [phone] (Primary) Address: City/ State/ Zip: [place]  [address] Client [facility] - [facility], [facility] Provider Tana Conch- MD Contact Type Call Who Is Calling Patient / Member / Family / Caregiver Call Type Triage / Clinical Relationship To Patient Self Return Phone Number [phone] (Primary) Chief Complaint Abdominal Pain Reason for Call Symptomatic / Request for Health Information Initial Comment Per Bonita Quin from the office: Caller stated that for the past three weeks he has had pain on the lower left of his stomach; mainly when getting up, and using ABD muscles; otherwise he does not feel it. The area is tender to the touch, and very painful when he presses on it. Translation No Nurse Assessment Nurse: Elesa Hacker, RN, Nash Dimmer Date/Time (Eastern Time): 03/24/2023 9:44:29 AM Confirm and document reason for call. If symptomatic, describe symptoms. ---Per Bonita Quin from the office: Caller stated that for the past three weeks he has had pain on the lower left of his stomach; mainly when getting up, and using ABD muscles; otherwise he does not feel it. The area is tender to the touch, and very painful when he presses on it. no temp. Does the patient have any new or worsening symptoms? ---Yes Will a triage be completed? ---Yes Related visit to physician within the last 2 weeks? ---No Does the PT have any chronic conditions? (i.e. diabetes, asthma, this includes High risk factors for pregnancy, etc.) ---Yes List chronic conditions. ---monitored for 20 years for renal failure, stable , was just seen sees a urologist high cholesterol Is this a behavioral health or substance abuse call? ---No  Guidelines Guideline Title Affirmed Question Affirmed Notes Nurse  Date/Time (Eastern Time) Abdominal Pain - Male [1] Unable to urinate (or only a few drops) > 4 hours AND [2] bladder feels very full (e.g., palpable bladder or strong urge to urinate) Deaton, RN, Nash Dimmer 03/24/2023 9:47:03 AM Disp. Time Lamount Cohen Time) Disposition Final User 03/24/2023 9:31:01 AM Attempt made - message left Deaton, RN, Nash Dimmer 03/24/2023 9:53:45 AM Go to ED Now Yes Elesa Hacker, RN, Nash Dimmer Final Disposition 03/24/2023 9:53:45 AM Go to ED Now Yes Deaton, RN, Cory Roughen Disagree/Comply Disagree Caller Understands Yes PreDisposition Did not know what to do Care Advice Given Per Guideline GO TO ED NOW: * You need to be seen in the Emergency Department. * Go to the ED at ___________ Hospital. * Leave now. Drive carefully. CARE ADVICE given per Abdominal Pain - Male (Adult) guideline.  Comments User: Wandra Scot, RN Date/Time Lamount Cohen Time): 03/24/2023 9:57:54 AM Caller advised that he is having pain when pressing above pubic bone. Also only dribbling urine. No stream. Does have appt with specialist Wilson Singer on the 17th for a procedure. Called the office backline and gave info to [place]. She will let Dr. Therapist, nutritional know. Caller is also going to call Dr. Wilson Singer. Referrals GO TO FACILITY REFUSED

## 2023-03-24 NOTE — Telephone Encounter (Signed)
Sherrilyn Rist from Triage nurse states he needs to go to ED -  Patient declined ED. Please advise .

## 2023-03-27 DIAGNOSIS — R1032 Left lower quadrant pain: Secondary | ICD-10-CM | POA: Diagnosis not present

## 2023-03-27 DIAGNOSIS — N3020 Other chronic cystitis without hematuria: Secondary | ICD-10-CM | POA: Diagnosis not present

## 2023-03-28 ENCOUNTER — Ambulatory Visit (INDEPENDENT_AMBULATORY_CARE_PROVIDER_SITE_OTHER): Payer: Medicare Other | Admitting: Family Medicine

## 2023-03-28 ENCOUNTER — Encounter: Payer: Self-pay | Admitting: Family Medicine

## 2023-03-28 VITALS — BP 128/66 | HR 72 | Temp 97.4°F | Ht 74.0 in | Wt 276.4 lb

## 2023-03-28 DIAGNOSIS — E785 Hyperlipidemia, unspecified: Secondary | ICD-10-CM | POA: Diagnosis not present

## 2023-03-28 DIAGNOSIS — N184 Chronic kidney disease, stage 4 (severe): Secondary | ICD-10-CM

## 2023-03-28 DIAGNOSIS — I350 Nonrheumatic aortic (valve) stenosis: Secondary | ICD-10-CM | POA: Diagnosis not present

## 2023-03-28 DIAGNOSIS — E1122 Type 2 diabetes mellitus with diabetic chronic kidney disease: Secondary | ICD-10-CM

## 2023-03-28 DIAGNOSIS — I10 Essential (primary) hypertension: Secondary | ICD-10-CM

## 2023-03-28 DIAGNOSIS — E1169 Type 2 diabetes mellitus with other specified complication: Secondary | ICD-10-CM | POA: Diagnosis not present

## 2023-03-28 LAB — COMPREHENSIVE METABOLIC PANEL
ALT: 13 U/L (ref 0–53)
AST: 15 U/L (ref 0–37)
Albumin: 3.8 g/dL (ref 3.5–5.2)
Alkaline Phosphatase: 84 U/L (ref 39–117)
BUN: 38 mg/dL — ABNORMAL HIGH (ref 6–23)
CO2: 22 mEq/L (ref 19–32)
Calcium: 9.3 mg/dL (ref 8.4–10.5)
Chloride: 109 mEq/L (ref 96–112)
Creatinine, Ser: 2.45 mg/dL — ABNORMAL HIGH (ref 0.40–1.50)
GFR: 23.26 mL/min — ABNORMAL LOW (ref >60.00)
Glucose, Bld: 119 mg/dL — ABNORMAL HIGH (ref 70–99)
Potassium: 4.5 mEq/L (ref 3.5–5.1)
Sodium: 141 mEq/L (ref 135–145)
Total Bilirubin: 0.4 mg/dL (ref 0.2–1.2)
Total Protein: 6.7 g/dL (ref 6.0–8.3)

## 2023-03-28 LAB — CBC WITH DIFFERENTIAL/PLATELET
Basophils Absolute: 0.1 10*3/uL (ref 0.0–0.1)
Basophils Relative: 0.9 % (ref 0.0–3.0)
Eosinophils Absolute: 0.2 10*3/uL (ref 0.0–0.7)
Eosinophils Relative: 2.7 % (ref 0.0–5.0)
HCT: 40.7 % (ref 39.0–52.0)
Hemoglobin: 13.6 g/dL (ref 13.0–17.0)
Lymphocytes Relative: 29.3 % (ref 12.0–46.0)
Lymphs Abs: 2.1 10*3/uL (ref 0.7–4.0)
MCHC: 33.3 g/dL (ref 30.0–36.0)
MCV: 90.9 fl (ref 78.0–100.0)
Monocytes Absolute: 0.7 10*3/uL (ref 0.1–1.0)
Monocytes Relative: 9.3 % (ref 3.0–12.0)
Neutro Abs: 4.2 10*3/uL (ref 1.4–7.7)
Neutrophils Relative %: 57.8 % (ref 43.0–77.0)
Platelets: 238 10*3/uL (ref 150.0–400.0)
RBC: 4.48 Mil/uL (ref 4.22–5.81)
RDW: 14.6 % (ref 11.5–15.5)
WBC: 7.2 10*3/uL (ref 4.0–10.5)

## 2023-03-28 LAB — LIPID PANEL
Cholesterol: 159 mg/dL (ref 0–200)
HDL: 49.7 mg/dL (ref >39.00)
LDL Cholesterol: 94 mg/dL (ref 0–99)
NonHDL: 109.64
Total CHOL/HDL Ratio: 3
Triglycerides: 79 mg/dL (ref 0.0–149.0)
VLDL: 15.8 mg/dL (ref 0.0–40.0)

## 2023-03-28 LAB — TSH: TSH: 3.83 u[IU]/mL (ref 0.35–5.50)

## 2023-03-28 LAB — MICROALBUMIN / CREATININE URINE RATIO
Creatinine,U: 75.4 mg/dL
Microalb Creat Ratio: 17.7 mg/g (ref 0.0–30.0)
Microalb, Ur: 13.3 mg/dL — ABNORMAL HIGH (ref 0.0–1.9)

## 2023-03-28 LAB — HEMOGLOBIN A1C: Hgb A1c MFr Bld: 5.8 % (ref 4.6–6.5)

## 2023-03-28 NOTE — Progress Notes (Signed)
Phone [phone] In person visit   Subjective:   Charles Castillo. is a 89 y.o. year old very pleasant male patient who presents for/with See problem oriented charting Chief Complaint  Patient presents with   Follow-up   Diabetes   Hypertension    Past Medical History-  Patient Active Problem List   Diagnosis Date Noted   Mild aortic stenosis 10/25/2018    Priority: High   Type II diabetes mellitus with stage 4 chronic kidney disease 09/15/2014    Priority: High   CKD (chronic kidney disease), stage IV 03/24/2008    Priority: High   Dysphagia 10/30/2017    Priority: Medium    Gout 05/24/2016    Priority: Medium    Erectile dysfunction 09/15/2014    Priority: Medium    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HCC) 11/04/2009    Priority: Medium    Hyperlipidemia associated with type 2 diabetes mellitus 08/27/2007    Priority: Medium    Essential hypertension 07/03/2007    Priority: Medium    Sleep apnea 07/03/2007    Priority: Medium    Former smoker 09/15/2014    Priority: Low   Osteoarthritis 07/03/2007    Priority: Low   COVID-19 virus infection 06/20/2019   Generalized weakness 06/19/2019   Syncope 05/23/2017    Medications- reviewed and updated Current Outpatient Medications  Medication Sig Dispense Refill   amLODipine (NORVASC) 5 MG tablet TAKE 1 TABLET AT BEDTIME 90 tablet 3   Cyanocobalamin (VITAMIN B 12 PO) Take by mouth once a week.     OVER THE COUNTER MEDICATION Take 1 tablet by mouth at bedtime as needed (sleep). [employer] brand sleep aid     pantoprazole (PROTONIX) 40 MG tablet Take 1 tablet (40 mg total) by mouth 2 (two) times daily. 180 tablet 0   rosuvastatin (CRESTOR) 10 MG tablet TAKE 1 TABLET DAILY 90 tablet 3   tamsulosin (FLOMAX) 0.4 MG CAPS capsule TAKE 2 CAPSULES DAILY 180 capsule 3   VITAMIN D PO Take by mouth.     No current facility-administered medications for this visit.     Objective:  BP 128/66   Pulse 72   Temp (!) 97.4 F (36.3  C)   Ht 6\' 2"  (1.88 m)   Wt 276 lb 6.4 oz (125.4 kg)   SpO2 99%   BMI 35.49 kg/m  Gen: NAD, resting comfortably CV: RRR no murmurs rubs or gallops Lungs: CTAB no crackles, wheeze, rhonchi Ext: 1+ edema Skin: warm, dry     Assessment and Plan    #Hypertension/CKD IV- sees Dr. Signe Colt S: Compliant with amlodipine 5 mg BP Readings from Last 3 Encounters:  03/28/23 128/66  10/04/22 118/60  09/23/22 118/60  A/P: CKD IV - reports stability with Dr. Signe Colt- they wanted urine- was done with Dr. Annabell Howells yesterday  Hypertension- stable- continue current medicines    #Hyperlipidemia S: medication: rosuvastatin 10 mg (unable to increase above this due to renal function- changed February 2023) Lab Results  Component Value Date   CHOL 167 04/08/2022   HDL 51.20 04/08/2022   LDLCALC 94 04/08/2022   LDLDIRECT 123.0 10/12/2018   TRIG 109.0 04/08/2022   CHOLHDL 3 04/08/2022  A/P: reasonable control for age last year- update lipid panel     #Diabetes S: Has been diet controlled on recent checks Lab Results  Component Value Date   HGBA1C 5.9 (A) 10/04/2022   HGBA1C 6.2 04/08/2022   HGBA1C 6.0 09/27/2021  A/P:  hopefully stable- update a1c  today. Continue without meds for now     #Mild aortic stenosis S: Last echocardiogram was June 23, 2019. Notes some edema without pain  A/P: with swelling in legs- we opted to go ahead and recheck this though stable- reassess for worsening.   % Urethral carcinoma-patient follows with [facility] previously- now with Dr. Annabell Howells only  .History of urethrectomy and bilateral lymph node dissection. -Urethral stricture in 2024 (failed in office) requiring OR visit- also saw him yesterday for some LLQ pain- was told pulled muscle -he is hopeful that with procedure will improve QOL- goes every 1-1.5 hours and decreasing sleep quality   # GERD/dysphagia- doing well :on high dose PPI twice daily per DR. Pyrtle - down to one dose a day  Recommended follow up: Return in  about 7 months (around 10/28/2023) for followup or sooner if needed.Schedule b4 you leave. Future Appointments  Date Time Provider Department Center  10/30/2023  9:15 AM [facility] ANNUAL WELLNESS VISIT 1 [facility]   Lab/Order associations:   ICD-10-CM   1. Mild aortic stenosis  I35.0 ECHOCARDIOGRAM COMPLETE    2. Type 2 diabetes mellitus with stage 4 chronic kidney disease, without long-term current use of insulin  E11.22 CBC with Differential/Platelet   N18.4 Comprehensive metabolic panel    Lipid panel    HgB A1c    Microalbumin / creatinine urine ratio    3. CKD (chronic kidney disease), stage IV  N18.4 CBC with Differential/Platelet    Comprehensive metabolic panel    4. Essential hypertension  I10     5. Hyperlipidemia associated with type 2 diabetes mellitus  E11.69 TSH   E78.5       No orders of the defined types were placed in this encounter.   Return precautions advised.  Tana Conch, MD

## 2023-03-28 NOTE — Patient Instructions (Addendum)
Let us know if you get anymore COVID vaccines or  your [facility] vaccine at the pharmacy or Tetanus, Diphtheria, and Pertussis (Tdap)  -covered at pharmacy but not here  Please stop by lab before you go If you have mychart- we will send your results within 3 business days of [place] receiving them.  If you do not have mychart- we will call you about results within 5 business days of [place] receiving them.  *please also note that you will see labs on mychart as soon as they post. I will later go in and write notes on them- will say "notes from Dr. Durene Cal"   Recommended follow up: Return in about 7 months (around 10/28/2023) for followup or sooner if needed.Schedule b4 you leave.

## 2023-03-31 ENCOUNTER — Encounter (HOSPITAL_BASED_OUTPATIENT_CLINIC_OR_DEPARTMENT_OTHER): Payer: Self-pay | Admitting: Urology

## 2023-04-03 ENCOUNTER — Encounter (HOSPITAL_BASED_OUTPATIENT_CLINIC_OR_DEPARTMENT_OTHER): Payer: Self-pay | Admitting: Urology

## 2023-04-04 ENCOUNTER — Encounter (HOSPITAL_BASED_OUTPATIENT_CLINIC_OR_DEPARTMENT_OTHER): Payer: Self-pay | Admitting: Urology

## 2023-04-04 ENCOUNTER — Telehealth: Payer: Self-pay | Admitting: Pharmacist

## 2023-04-04 NOTE — Progress Notes (Signed)
Care Management & Coordination Services Pharmacy Team  Reason for Encounter: General adherence update   Contacted patient for general health update and medication adherence call.  Unsuccessful outreach. Left voicemail for patient to return call.   What concerns do you have about your medications?  The patient  side effects with their medications.   How often do you forget or accidentally miss a dose?   Do you use a pillbox?   Are you having any problems getting your medications from your pharmacy?   Has the cost of your medications been a concern?  If yes, what medication and is patient assistance available or has it been applied for?  Since last visit with PharmD,  interventions have been made.   The patient  had an ED visit since last contact.   The patient  problems with their health.   Patient  concerns or questions for , PharmD at this time.   Counseled patient on:    Chart Updates:  Recent office visits:  03/28/2023 [facility] (PCP) Shelva Majestic, MD; no medication changes indicated.  Recent consult visits:  None  Hospital visits:  None in previous 6 months  Medications: Outpatient Encounter Medications as of 04/04/2023  Medication Sig   amLODipine (NORVASC) 5 MG tablet TAKE 1 TABLET AT BEDTIME   Cyanocobalamin (VITAMIN B 12 PO) Take by mouth once a week.   OVER THE COUNTER MEDICATION Take 1 tablet by mouth at bedtime as needed (sleep). [employer] brand sleep aid   pantoprazole (PROTONIX) 40 MG tablet Take 1 tablet (40 mg total) by mouth 2 (two) times daily.   rosuvastatin (CRESTOR) 10 MG tablet TAKE 1 TABLET DAILY   tamsulosin (FLOMAX) 0.4 MG CAPS capsule TAKE 2 CAPSULES DAILY   VITAMIN D PO Take by mouth.   No facility-administered encounter medications on file as of 04/04/2023.    Recent vitals BP Readings from Last 3 Encounters:  03/28/23 128/66  10/04/22 118/60  09/23/22 118/60   Pulse Readings from Last 3 Encounters:  03/28/23 72  10/04/22 89   09/23/22 86   Wt Readings from Last 3 Encounters:  03/28/23 276 lb 6.4 oz (125.4 kg)  10/24/22 267 lb (121.1 kg)  10/04/22 267 lb 12.8 oz (121.5 kg)   BMI Readings from Last 3 Encounters:  03/28/23 35.49 kg/m  10/24/22 34.28 kg/m  10/04/22 34.38 kg/m    Recent lab results    Component Value Date/Time   NA 141 03/28/2023 1139   NA 140 09/21/2022 0000   K 4.5 03/28/2023 1139   CL 109 03/28/2023 1139   CO2 22 03/28/2023 1139   GLUCOSE 119 (H) 03/28/2023 1139   GLUCOSE 119 (H) 10/31/2006 1019   BUN 38 (H) 03/28/2023 1139   BUN 35 (A) 09/21/2022 0000   CREATININE 2.45 (H) 03/28/2023 1139   CALCIUM 9.3 03/28/2023 1139    Lab Results  Component Value Date   CREATININE 2.45 (H) 03/28/2023   GFR 23.26 (L) 03/28/2023   EGFR 23 09/21/2022   GFRNONAA 24 09/17/2020   GFRAA 28 09/17/2020   Lab Results  Component Value Date/Time   HGBA1C 5.8 03/28/2023 11:39 AM   HGBA1C 5.9 (A) 10/04/2022 10:19 AM   HGBA1C 6.2 04/08/2022 10:53 AM   HGBA1C 6.0 09/27/2021 12:00 AM   HGBA1C 6.1 03/11/2021 12:00 AM   MICROALBUR 13.3 (H) 03/28/2023 11:39 AM   MICROALBUR 14.6 (H) 04/08/2022 10:53 AM    Lab Results  Component Value Date   CHOL 159 03/28/2023  HDL 49.70 03/28/2023   LDLCALC 94 03/28/2023   LDLDIRECT 123.0 10/12/2018   TRIG 79.0 03/28/2023   CHOLHDL 3 03/28/2023    Care Gaps: Annual wellness visit in last year? Yes  If Diabetic: Last eye exam / retinopathy screening: 10/06/2022 Last diabetic foot exam: 10/04/2022  Star Rating Drugs:  Rosuvastatin 10 mg last filled 03/08/2023 90 DS   Future Appointments  Date Time Provider Department Center  04/24/2023  1:05 PM [facility] ECHO 5 [id number]  10/30/2023  9:15 AM [facility] ANNUAL WELLNESS VISIT 1 [facility]  10/31/2023 11:00 AM Shelva Majestic, MD [facility]   April D Calhoun, [facility] Clinical Pharmacist Assistant [phone]

## 2023-04-04 NOTE — Progress Notes (Signed)
Spoke w/ via phone for pre-op interview--- pt Lab needs dos----  [employer], ekg             Lab results------ no COVID test -----patient states asymptomatic no test needed Arrive at ------- 0530 on 04-07-2023 NPO after MN NO Solid Food.  Clear liquids from MN until--- 0430 Med rec completed Medications to take morning of surgery ----- flomax Diabetic medication ----- n/a Patient instructed no nail polish to be worn day of surgery Patient instructed to bring photo id and insurance card day of surgery Patient aware to have Driver (ride ) / caregiver    for 24 hours after surgery --- wife, fran Patient Special Instructions ----- n/a Pre-Op special Istructions ----- n/a Patient verbalized understanding of instructions that were given at this phone interview. Patient denies shortness of breath, chest pain, fever, cough at this phone interview.

## 2023-04-05 ENCOUNTER — Ambulatory Visit: Payer: Medicare Other | Admitting: Family Medicine

## 2023-04-06 NOTE — Anesthesia Preprocedure Evaluation (Addendum)
Anesthesia Evaluation  Patient identified by MRN, date of birth, ID band Patient awake    Reviewed: Allergy & Precautions, NPO status , Patient's Chart, lab work & pertinent test results  Airway Mallampati: II  TM Distance: >3 FB Neck ROM: Full    Dental no notable dental hx. (+) Teeth Intact, Dental Advisory Given   Pulmonary sleep apnea and Continuous Positive Airway Pressure Ventilation , former smoker   Pulmonary exam normal breath sounds clear to auscultation       Cardiovascular hypertension, Pt. on medications Normal cardiovascular exam Rhythm:Regular Rate:Normal     Neuro/Psych    GI/Hepatic hiatal hernia,,,  Endo/Other  diabetes, Type 2    Renal/GU CRF and Renal InsufficiencyRenal diseaseStage IV Lab Results      Component                Value               Date                      CREATININE               2.45 (H)            03/28/2023                BUN                      38 (H)              03/28/2023                NA                       141                 03/28/2023                K                        4.5                 03/28/2023                    Musculoskeletal  (+) Arthritis ,    Abdominal  (+) + obese (BMI 35.13)  Peds  Hematology Lab Results      Component                Value               Date                      WBC                      7.2                 03/28/2023                HGB                      13.6                03/28/2023                HCT  40.7                03/28/2023                MCV                      90.9                03/28/2023                PLT                      238.0               03/28/2023              Anesthesia Other Findings   Reproductive/Obstetrics                             Anesthesia Physical Anesthesia Plan  ASA: 3  Anesthesia Plan: General   Post-op Pain Management: Ofirmev IV  (intra-op)* and Precedex   Induction: Intravenous  PONV Risk Score and Plan: Treatment may vary due to age or medical condition and Ondansetron  Airway Management Planned: LMA  Additional Equipment: None  Intra-op Plan:   Post-operative Plan:   Informed Consent: I have reviewed the patients History and Physical, chart, labs and discussed the procedure including the risks, benefits and alternatives for the proposed anesthesia with the patient or authorized representative who has indicated his/her understanding and acceptance.     Dental advisory given  Plan Discussed with: CRNA  Anesthesia Plan Comments:        Anesthesia Quick Evaluation

## 2023-04-07 ENCOUNTER — Encounter (HOSPITAL_BASED_OUTPATIENT_CLINIC_OR_DEPARTMENT_OTHER)
Admission: RE | Disposition: A | Discharge status: Home / Self Care | Payer: Self-pay | Source: Home / Self Care | Attending: Urology

## 2023-04-07 ENCOUNTER — Encounter (HOSPITAL_BASED_OUTPATIENT_CLINIC_OR_DEPARTMENT_OTHER): Payer: Self-pay | Admitting: Urology

## 2023-04-07 ENCOUNTER — Other Ambulatory Visit: Payer: Self-pay

## 2023-04-07 ENCOUNTER — Ambulatory Visit (HOSPITAL_BASED_OUTPATIENT_CLINIC_OR_DEPARTMENT_OTHER)
Admission: RE | Admit: 2023-04-07 | Discharge: 2023-04-07 | Disposition: A | Discharge status: Home / Self Care | Payer: Medicare Other | Attending: Urology | Admitting: Urology

## 2023-04-07 ENCOUNTER — Ambulatory Visit (HOSPITAL_BASED_OUTPATIENT_CLINIC_OR_DEPARTMENT_OTHER): Payer: Medicare Other | Admitting: Anesthesiology

## 2023-04-07 DIAGNOSIS — Z8554 Personal history of malignant neoplasm of ureter: Secondary | ICD-10-CM | POA: Diagnosis not present

## 2023-04-07 DIAGNOSIS — Z01818 Encounter for other preprocedural examination: Secondary | ICD-10-CM

## 2023-04-07 DIAGNOSIS — E1122 Type 2 diabetes mellitus with diabetic chronic kidney disease: Secondary | ICD-10-CM | POA: Insufficient documentation

## 2023-04-07 DIAGNOSIS — N401 Enlarged prostate with lower urinary tract symptoms: Secondary | ICD-10-CM | POA: Insufficient documentation

## 2023-04-07 DIAGNOSIS — N35912 Unspecified bulbous urethral stricture, male: Secondary | ICD-10-CM

## 2023-04-07 DIAGNOSIS — N184 Chronic kidney disease, stage 4 (severe): Secondary | ICD-10-CM | POA: Diagnosis not present

## 2023-04-07 DIAGNOSIS — N99111 Postprocedural bulbous urethral stricture: Secondary | ICD-10-CM

## 2023-04-07 DIAGNOSIS — N138 Other obstructive and reflux uropathy: Secondary | ICD-10-CM | POA: Diagnosis not present

## 2023-04-07 DIAGNOSIS — Z87891 Personal history of nicotine dependence: Secondary | ICD-10-CM | POA: Diagnosis not present

## 2023-04-07 DIAGNOSIS — N189 Chronic kidney disease, unspecified: Secondary | ICD-10-CM

## 2023-04-07 DIAGNOSIS — R1032 Left lower quadrant pain: Secondary | ICD-10-CM | POA: Insufficient documentation

## 2023-04-07 DIAGNOSIS — K449 Diaphragmatic hernia without obstruction or gangrene: Secondary | ICD-10-CM | POA: Diagnosis not present

## 2023-04-07 DIAGNOSIS — N3020 Other chronic cystitis without hematuria: Secondary | ICD-10-CM | POA: Insufficient documentation

## 2023-04-07 DIAGNOSIS — Z87440 Personal history of urinary (tract) infections: Secondary | ICD-10-CM | POA: Insufficient documentation

## 2023-04-07 DIAGNOSIS — N3946 Mixed incontinence: Secondary | ICD-10-CM | POA: Insufficient documentation

## 2023-04-07 DIAGNOSIS — I129 Hypertensive chronic kidney disease with stage 1 through stage 4 chronic kidney disease, or unspecified chronic kidney disease: Secondary | ICD-10-CM | POA: Insufficient documentation

## 2023-04-07 DIAGNOSIS — N35919 Unspecified urethral stricture, male, unspecified site: Secondary | ICD-10-CM | POA: Diagnosis not present

## 2023-04-07 DIAGNOSIS — Z906 Acquired absence of other parts of urinary tract: Secondary | ICD-10-CM | POA: Diagnosis not present

## 2023-04-07 HISTORY — DX: Chronic gout, unspecified, without tophus (tophi): M1A.9XX0

## 2023-04-07 HISTORY — PX: CYSTOSCOPY WITH URETHRAL DILATATION: SHX5125

## 2023-04-07 HISTORY — DX: Unspecified urethral stricture, male, unspecified site: N35.919

## 2023-04-07 HISTORY — DX: Neoplasm of unspecified behavior of digestive system: D49.0

## 2023-04-07 HISTORY — DX: Nonrheumatic aortic (valve) stenosis: I35.0

## 2023-04-07 HISTORY — DX: Chronic kidney disease, unspecified: N18.9

## 2023-04-07 HISTORY — DX: Secondary hyperparathyroidism of renal origin: N25.81

## 2023-04-07 HISTORY — DX: Presence of external hearing-aid: Z97.4

## 2023-04-07 HISTORY — DX: Anemia in chronic kidney disease: D63.1

## 2023-04-07 HISTORY — DX: Gastroduodenitis, unspecified, without bleeding: K29.90

## 2023-04-07 HISTORY — DX: Vitamin D deficiency, unspecified: E55.9

## 2023-04-07 HISTORY — DX: Chronic kidney disease, stage 4 (severe): N18.4

## 2023-04-07 SURGERY — CYSTOSCOPY, WITH URETHRAL DILATION
Anesthesia: General | Site: Ureter

## 2023-04-07 MED ORDER — SODIUM CHLORIDE 0.9 % IR SOLN
Status: DC | PRN
  Administered 2023-04-07: 3000 mL

## 2023-04-07 MED ORDER — SODIUM CHLORIDE 0.9 % IV SOLN: INTRAVENOUS | Status: DC

## 2023-04-07 MED ORDER — DEXMEDETOMIDINE HCL IN NACL 80 MCG/20ML IV SOLN
INTRAVENOUS | Status: AC
  Filled 2023-04-07: qty 20

## 2023-04-07 MED ORDER — PROPOFOL 10 MG/ML IV BOLUS
INTRAVENOUS | Status: DC | PRN
  Administered 2023-04-07: 150 mg via INTRAVENOUS

## 2023-04-07 MED ORDER — SODIUM CHLORIDE 0.9 % IV SOLN
2.0000 g | INTRAVENOUS | Status: AC
  Administered 2023-04-07: 2 g via INTRAVENOUS

## 2023-04-07 MED ORDER — ACETAMINOPHEN 10 MG/ML IV SOLN: 1000.0000 mg | Freq: Once | INTRAVENOUS | Status: DC | PRN

## 2023-04-07 MED ORDER — STERILE WATER FOR IRRIGATION IR SOLN
Status: DC | PRN
  Administered 2023-04-07: 500 mL

## 2023-04-07 MED ORDER — FENTANYL CITRATE (PF) 100 MCG/2ML IJ SOLN
INTRAMUSCULAR | Status: DC | PRN
  Administered 2023-04-07: 50 ug via INTRAVENOUS

## 2023-04-07 MED ORDER — IOHEXOL 300 MG/ML  SOLN
INTRAMUSCULAR | Status: DC | PRN
  Administered 2023-04-07: 25 mL via URETHRAL

## 2023-04-07 MED ORDER — ONDANSETRON HCL 4 MG/2ML IJ SOLN: 4.0000 mg | Freq: Once | INTRAMUSCULAR | Status: DC | PRN

## 2023-04-07 MED ORDER — DEXAMETHASONE SODIUM PHOSPHATE 10 MG/ML IJ SOLN
INTRAMUSCULAR | Status: AC
  Filled 2023-04-07: qty 1

## 2023-04-07 MED ORDER — FENTANYL CITRATE (PF) 100 MCG/2ML IJ SOLN
INTRAMUSCULAR | Status: AC
  Filled 2023-04-07: qty 2

## 2023-04-07 MED ORDER — LIDOCAINE HCL (PF) 2 % IJ SOLN
INTRAMUSCULAR | Status: AC
  Filled 2023-04-07: qty 5

## 2023-04-07 MED ORDER — SODIUM CHLORIDE 0.9% FLUSH: 3.0000 mL | Freq: Two times a day (BID) | INTRAVENOUS | Status: DC

## 2023-04-07 MED ORDER — SODIUM CHLORIDE 0.9 % IV SOLN
INTRAVENOUS | Status: AC
  Filled 2023-04-07: qty 100

## 2023-04-07 MED ORDER — CEFTRIAXONE SODIUM 2 G IJ SOLR
INTRAMUSCULAR | Status: AC
  Filled 2023-04-07: qty 20

## 2023-04-07 MED ORDER — DEXAMETHASONE SODIUM PHOSPHATE 10 MG/ML IJ SOLN
INTRAMUSCULAR | Status: DC | PRN
  Administered 2023-04-07: 5 mg via INTRAVENOUS

## 2023-04-07 MED ORDER — PROPOFOL 10 MG/ML IV BOLUS
INTRAVENOUS | Status: AC
  Filled 2023-04-07: qty 20

## 2023-04-07 MED ORDER — PHENYLEPHRINE 80 MCG/ML (10ML) SYRINGE FOR IV PUSH (FOR BLOOD PRESSURE SUPPORT)
PREFILLED_SYRINGE | INTRAVENOUS | Status: AC
  Filled 2023-04-07: qty 10

## 2023-04-07 MED ORDER — 0.9 % SODIUM CHLORIDE (POUR BTL) OPTIME
TOPICAL | Status: DC | PRN
  Administered 2023-04-07: 500 mL

## 2023-04-07 MED ORDER — ONDANSETRON HCL 4 MG/2ML IJ SOLN
INTRAMUSCULAR | Status: AC
  Filled 2023-04-07: qty 2

## 2023-04-07 MED ORDER — EPHEDRINE SULFATE-NACL 50-0.9 MG/10ML-% IV SOSY
PREFILLED_SYRINGE | INTRAVENOUS | Status: DC | PRN
  Administered 2023-04-07 (×3): 5 mg via INTRAVENOUS

## 2023-04-07 MED ORDER — PHENYLEPHRINE HCL (PRESSORS) 10 MG/ML IV SOLN
INTRAVENOUS | Status: DC | PRN
  Administered 2023-04-07 (×5): 80 ug via INTRAVENOUS
  Administered 2023-04-07: 160 ug via INTRAVENOUS

## 2023-04-07 MED ORDER — ONDANSETRON HCL 4 MG/2ML IJ SOLN
INTRAMUSCULAR | Status: DC | PRN
  Administered 2023-04-07: 4 mg via INTRAVENOUS

## 2023-04-07 MED ORDER — FENTANYL CITRATE (PF) 100 MCG/2ML IJ SOLN: 25.0000 ug | INTRAMUSCULAR | Status: DC | PRN

## 2023-04-07 MED ORDER — LIDOCAINE 2% (20 MG/ML) 5 ML SYRINGE
INTRAMUSCULAR | Status: DC | PRN
  Administered 2023-04-07: 100 mg via INTRAVENOUS

## 2023-04-07 SURGICAL SUPPLY — 37 items
BAG DRAIN URO-CYSTO SKYTR STRL (DRAIN) ×1 IMPLANT
BAG DRN UROCATH (DRAIN) ×1
BALLN NEPHROSTOMY (BALLOONS) ×1
BALLN OPTILUME DCB 30X3X75 (BALLOONS) ×1
BALLN OPTILUME DCB 30X5X75 (BALLOONS)
BALLOON NEPHROSTOMY (BALLOONS) ×1 IMPLANT
BALLOON OPTILUME DCB 30X3X75 (BALLOONS) IMPLANT
BALLOON OPTILUME DCB 30X5X75 (BALLOONS) IMPLANT
CATH COUNCIL 22FR (CATHETERS) IMPLANT
CATH FOLEY 2W COUNCIL 20FR 5CC (CATHETERS) IMPLANT
CATH FOLEY 2W COUNCIL 5CC 16FR (CATHETERS) IMPLANT
CATH FOLEY 2W COUNCIL 5CC 18FR (CATHETERS) IMPLANT
CATH FOLEY 2WAY SLVR  5CC 16FR (CATHETERS)
CATH FOLEY 2WAY SLVR 5CC 16FR (CATHETERS) IMPLANT
CATH URETL OPEN 5X70 (CATHETERS) IMPLANT
CLOTH BEACON ORANGE TIMEOUT ST (SAFETY) ×2 IMPLANT
ELECT REM PT RETURN 9FT ADLT (ELECTROSURGICAL)
ELECTRODE REM PT RTRN 9FT ADLT (ELECTROSURGICAL) ×1 IMPLANT
GLOVE BIO SURGEON STRL SZ 6.5 (GLOVE) IMPLANT
GLOVE BIOGEL PI IND STRL 6.5 (GLOVE) IMPLANT
GLOVE SURG SS PI 8.0 STRL IVOR (GLOVE) ×1 IMPLANT
GOWN STRL REUS W/ TWL LRG LVL3 (GOWN DISPOSABLE) IMPLANT
GOWN STRL REUS W/TWL LRG LVL3 (GOWN DISPOSABLE) ×1
GOWN STRL REUS W/TWL XL LVL3 (GOWN DISPOSABLE) ×1 IMPLANT
GUIDEWIRE STR DUAL SENSOR (WIRE) IMPLANT
KIT TURNOVER CYSTO (KITS) ×1 IMPLANT
MANIFOLD NEPTUNE II (INSTRUMENTS) IMPLANT
NDL HYPO 22X1.5 SAFETY MO (MISCELLANEOUS) IMPLANT
NDL SAFETY ECLIP 18X1.5 (MISCELLANEOUS) IMPLANT
NEEDLE HYPO 22X1.5 SAFETY MO (MISCELLANEOUS) IMPLANT
NS IRRIG 500ML POUR BTL (IV SOLUTION) IMPLANT
PACK CYSTO (CUSTOM PROCEDURE TRAY) ×1 IMPLANT
SLEEVE SCD COMPRESS KNEE MED (STOCKING) ×1 IMPLANT
SYR 20ML LL LF (SYRINGE) IMPLANT
SYR 30ML LL (SYRINGE) IMPLANT
TUBE CONNECTING 12X1/4 (SUCTIONS) IMPLANT
WATER STERILE IRR 3000ML UROMA (IV SOLUTION) ×1 IMPLANT

## 2023-04-07 NOTE — Anesthesia Postprocedure Evaluation (Signed)
Anesthesia Post Note  Patient: Charles Castillo.  Procedure(s) Performed: CYSTOSCOPY WITH OPTILUME URETHRAL BALLOON DILATATION (Ureter)     Patient location during evaluation: PACU Anesthesia Type: General Level of consciousness: awake and alert Pain management: pain level controlled Vital Signs Assessment: post-procedure vital signs reviewed and stable Respiratory status: spontaneous breathing, nonlabored ventilation, respiratory function stable and patient connected to nasal cannula oxygen Cardiovascular status: blood pressure returned to baseline and stable Postop Assessment: no apparent nausea or vomiting Anesthetic complications: no  No notable events documented.  Last Vitals:  Vitals:   04/07/23 0900 04/07/23 0934  BP:  (!) 130/56  Pulse: 65 66  Resp: 17 20  Temp:    SpO2: 98% 99%    Last Pain:  Vitals:   04/07/23 0854  TempSrc:   PainSc: 0-No pain                 Trevor Iha

## 2023-04-07 NOTE — Anesthesia Procedure Notes (Signed)
Procedure Name: LMA Insertion Date/Time: 04/07/2023 7:50 AM  Performed by: Bishop Limbo, CRNAPre-anesthesia Checklist: Patient identified, Emergency Drugs available, Suction available and Patient being monitored Patient Re-evaluated:Patient Re-evaluated prior to induction Oxygen Delivery Method: Circle System Utilized Preoxygenation: Pre-oxygenation with 100% oxygen Induction Type: IV induction LMA: LMA inserted LMA Size: 5.0 Number of attempts: 1 Airway Equipment and Method: Bite block Placement Confirmation: positive ETCO2 Tube secured with: Tape Dental Injury: Teeth and Oropharynx as per pre-operative assessment

## 2023-04-07 NOTE — H&P (Signed)
1. BPH with BOO and MUI with elevated PVR and a very weak stream.   2. History of UTI. He was treated with cephalexin in January.   3. History of urethral squamous cell CA diagnosed in 2008 with partial penectomy and subsequent urethrectomy at [facility] in 2016 with chemo and resection of nodal mets and perineal urethrostomy. He was NED in 3/23 at [facility].   4. Elevated PSA.   03/08/23: Today he returns for a FR and possible cystoscopy evaluation his voiding symptoms.   03/27/23: Charles Castillo returns with a 2 week history of some LLQ discomfort that is more positional with he gets out of bed or if he twists or turns it gets worse. He has stable voiding symptoms. He has had fever. He has no dysuria. He has no GI complaints. He has CKD and can't take NSAID's.     ALLERGIES: No Allergies    MEDICATIONS: Tamsulosin Hcl 0.4 mg capsule  Amlodipine Besylate 5 mg tablet Oral  Pantoprazole Sodium 40 mg tablet, delayed release  Rosuvastatin Calcium 40 mg tablet  Sleep Aid  Vitamin B12  Vitamin D3     GU PSH: Complex Uroflow - 03/08/2023 Cysto Bladder Ureth Biopsy - 2008 Cystoscopy - 03/08/2023 Revise Urethra - 2016 Urethral Biopsy - 2008       PSH Notes: Layer Closure Of Wound External Genitalia 2.6 To 7.5 Cm, Urethromeatoplasty With Partial Excision Of Distal Ureth Seg, Urethral Biopsy, Cystoscopy With Biopsy   NON-GU PSH: Layer Closure Wound(s) - 2016     GU PMH: BPH w/LUTS, Continue tamsulosin - 03/08/2023, - 01/12/2023, He has stable LUTS on tamsulosin bid. Dr. Durene Cal fills that script. , - 10/07/2022, Acceptable bladder residual today. He's had an exacerbation from baseline that correlates with UA findings today., - 08/24/2021, He has stable moderate LUTS with some urgency and frequency. We will see if part of the issue is the recurrent UTI. , - 05/24/2021, Benign Prostatic Hyperplasia With Urinary Obstruction, - 2014 Bulbar urethral stricture, He has a severe stricture of the urethrostomy and bulbar  urethra. I will get him set up for cystoscopy with urethral dilation and optilume. I will get a PSA today and if it is up significantly we could consider a prostate biopsy. - 03/08/2023 Chronic cystitis (w/o hematuria), His UA doesn't look too bad today. I will repeat the culture. - 03/08/2023, UA looks infected but he has no symptoms. I will culture but not treat unless he becomes symptomatic. , - 10/07/2022, His urine remains colonized but he is asymptomatic so I will not treat. He will return in 6 months. , - 03/21/2022, He has persistent pyuria and bacteriuria but no symptoms of infection. I will get a culture for completeness. If the culture is positive, we may need consider treatment if a prostate biopsy is felt to be indicated. , - 12/24/2021, Today's UA suspicious for infection with some MH noted as well. He has not seen any gross hematuria., - 08/24/2021 Elevated PSA, repeat today. - 03/08/2023, His PSA was 5.45 with a 26% f/t ratio at his last visit and this doesn't merit further evaluation at his age and state of health. , - 03/21/2022, His PSA is up markedly to 19.2 and he does have small apical nodule but it is difficulty to tell if it is a rectal polyp or prostatic. I am going to repeat the PSA after a month for antibiotic therapy since the elevation is most likely related to inflammation. , - 2019 Urge incontinence, He didn't improve  with Myrbetriq. We will see how he does after dilation. - 03/08/2023, - 2021 Weak Urinary Stream - 03/08/2023 Acute Cystitis/UTI - 01/12/2023, - 12/28/2022, He has recurrent UTI's that he self treats with bactrim. I will get a culture today and treat accordingly. , - 05/24/2021, - 2019 History of urethral cancer - 01/12/2023, He has had no hematuria. , - 10/07/2022, He was free of recurrence in early March and will need repeat imaging and labs in 1 year and I will arrange at f/u in 6 months. His Urooncologist is leaving [facility]. , - 03/21/2022, He was NED at last f/u in 3/22 and is  scheduled with Dr. Carolyne Fiscal in 3/23. , - 12/24/2021, He was cancer free at last f/u. , - 05/24/2021 Incomplete bladder emptying - 01/12/2023, - 10/07/2022, - 12/24/2021 Mixed incontinence - 01/12/2023 Urinary Urgency - 01/12/2023, - 05/24/2021, - 2021 Nocturia - 10/07/2022, (Stable), His LUTS is worse and his PVR is up. I am going to have him take the tamsulosin bid for now. f/u in 3 months with a PVR. , - 12/24/2021, Nocturia, - 2014 Prostate nodule w/ LUTS, The previous small apical mid line nodule is not present but he has a large hard right prostatic nodule. I will get a PSA and reach out to Dr. Carolyne Fiscal since the patient will be getting a pelvic MRI in March. - 12/24/2021, - 2019 Personal Hx Urinary Tract Infections, He has occasional UTI's and his UA is clear after a course of bactrim for 7 days. - 2021 Chronic kidney disease stage 4 (GFR 15-30), GFR was 24 on 10/22/18 - 2019 Carcinoma in situ, unspecified, Squamous cell carcinoma in situ - 2016 Neoplasm of unspecified behavior of other genitourinary organ, Neoplasm of urethra - 2016 ED due to arterial insufficiency, Erectile dysfunction due to arterial insufficiency - 2016 BPH w/o LUTS, Benign prostatic hypertrophy without lower urinary tract symptoms - 2014 Kidney Failure Unspec, Renal Failure - 2014 Personal Hx Oth Urinary System diseases, History of urethral stricture - 2014      PMH Notes:  2011-03-23 16:10:03 - Note: Cellulitis Of The Leg  urethral cancer     NON-GU PMH: Abnormal findings on diagnostic imaging of other abdominal regions, including retroperitoneum, He has a Pancreatic cyst that is stable and f/u imaging was recommended for 1 year and that will be arranged by Dr. Velora Heckler. - 10/07/2022, He has an enlarging pancreatic cyst and 6 month MRI with MRCP was recommended. I have placed the order. , - 03/21/2022 Bacteriuria, He has symptoms and urinary findings most consistent with a UTI. I am going to repeat his culture and start him on Bactrim ds 1/2  tab po q12 with renal dosing. I adjust based on the culture. He will return in 2 weeks for a BMP and repeat UA and culture. - 2019 Encounter for general adult medical examination without abnormal findings, Encounter for preventive health examination - 2016 Personal history of other endocrine, nutritional and metabolic disease, History of hyperlipidemia - 2016, History of hypercholesterolemia, - 2014 Personal history of other diseases of the nervous system and sense organs, History of sleep apnea - 2014 Diabetes Type 2 Hypercholesterolemia Hypertension Sleep Apnea    FAMILY HISTORY: Congestive Heart Failure - Father   SOCIAL HISTORY: Marital Status: Married Preferred Language: English; Race: White Current Smoking Status: Patient does not smoke anymore.   Tobacco Use Assessment Completed: Used Tobacco in last 30 days? Drinks 1 caffeinated drink per day.     Notes: 1 son, 3 daughters  REVIEW OF SYSTEMS:    GU Review Male:   Patient denies frequent urination, hard to postpone urination, burning/ pain with urination, get up at night to urinate, leakage of urine, stream starts and stops, trouble starting your stream, have to strain to urinate , erection problems, and penile pain.  Gastrointestinal (Upper):   Patient denies nausea, vomiting, and indigestion/ heartburn.  Gastrointestinal (Lower):   Patient denies diarrhea and constipation.  Constitutional:   Patient denies fever, night sweats, weight loss, and fatigue.  Skin:   Patient denies skin rash/ lesion and itching.  Eyes:   Patient denies blurred vision and double vision.  Ears/ Nose/ Throat:   Patient denies sore throat and sinus problems.  Hematologic/Lymphatic:   Patient denies swollen glands and easy bruising.  Cardiovascular:   Patient denies leg swelling and chest pains.  Respiratory:   Patient denies cough and shortness of breath.  Endocrine:   Patient denies excessive thirst.  Musculoskeletal:   Patient denies back pain and  joint pain.  Neurological:   Patient denies headaches and dizziness.  Psychologic:   Patient denies depression and anxiety.   Notes: Lower abdominal pain    VITAL SIGNS: None   MULTI-SYSTEM PHYSICAL EXAMINATION:    Constitutional: Obese. No physical deformities. Normally developed. Good grooming.   Gastrointestinal: Abdominal tenderness in the LLQ that is isolated and not associated with peritoneal signs, obese. No mass, no rigidity.      Complexity of Data:  Records Review:   Previous Patient Records  Urine Test Review:   Urinalysis  X-Ray Review: MRI Pelvis: Reviewed Report. Discussed With Patient. 3/23 at [facility]. No GI issues noted.     03/08/23 12/24/21 12/13/18 10/22/18 08/13/08  PSA  Total PSA 1.73 ng/mL 5.45 ng/mL 2.74 ng/mL 19.2 ng/dl 1.61   Free PSA  0.96 ng/mL     % Free PSA  26 % PSA       PROCEDURES:          Visit Complexity - G2211 Chronic management of urethral stricture and chronic cystitis.          Urinalysis w/Scope Dipstick Dipstick Cont'd Micro  Color: Yellow Bilirubin: Neg mg/dL WBC/hpf: 10 - 04/VWU  Appearance: Cloudy Ketones: Neg mg/dL RBC/hpf: 3 - 98/JXB  Specific Gravity: 1.020 Blood: Neg ery/uL Bacteria: Few (10-25/hpf)  pH: 5.5 Protein: 1+ mg/dL Cystals: NS (Not Seen)  Glucose: Neg mg/dL Urobilinogen: 0.2 mg/dL Casts: Hyaline    Nitrites: Neg Trichomonas: Not Present    Leukocyte Esterase: 2+ leu/uL Mucous: Present      Epithelial Cells: 0 - 5/hpf      Yeast: NS (Not Seen)      Sperm: Not Present    ASSESSMENT:      ICD-10 Details  1 GU:   LLQ pain - R10.32 Undiagnosed New Problem - HE has some LLQ pain that seems musculoskeletal. I have reassured him and will have him use tylenol.   2   Chronic cystitis (w/o hematuria) - N30.20 Chronic, Stable, Improving - I will get the culture today and cancel the Friday urine drop off. He will keep his appointment for urethral dilation next week.    PLAN:           Orders Labs Urine Culture           Schedule Return Visit/Planned Activity: Keep Scheduled Appointment             Note: He can cancel the urine drop off on 4/12 and keep his  appointment for surgery on 4/19.           Document Letter(s):  Created for Patient: Clinical Summary         Notes:   CC: Dr. Tana Conch.         Next Appointment:      Next Appointment: 03/31/2023 11:30 AM    Appointment Type: Laboratory Appointment    Location: [facility] [phone]    Provider: Lab LAB    Reason for Visit: PRE OP URINE CULT DR [facility]

## 2023-04-07 NOTE — Transfer of Care (Signed)
Immediate Anesthesia Transfer of Care Note  Patient: Coren Sagan.  Procedure(s) Performed: CYSTOSCOPY WITH OPTILUME URETHRAL BALLOON DILATATION (Ureter)  Patient Location: PACU  Anesthesia Type:General  Level of Consciousness: awake, alert , and oriented  Airway & Oxygen Therapy: Patient Spontanous Breathing and Patient connected to nasal cannula oxygen  Post-op Assessment: Report given to RN and Post -op Vital signs reviewed and stable  Post vital signs: Reviewed and stable  Last Vitals:  Vitals Value Taken Time  BP    Temp    Pulse 71 04/07/23 0832  Resp 11 04/07/23 0832  SpO2 100 % 04/07/23 0832  Vitals shown include unvalidated device data.  Last Pain:  Vitals:   04/07/23 0604  TempSrc: Oral  PainSc: 0-No pain      Patients Stated Pain Goal: 7 (04/07/23 0604)  Complications: No notable events documented.

## 2023-04-07 NOTE — Op Note (Signed)
Procedure: 1.  Cystoscopy with Optilume balloon dilation of urethral stricture. 2.  Application of fluoroscopy.  Preop diagnosis: Bulbar urethral stricture.  Postop diagnosis: Same.  Surgeon: Dr. Bjorn Pippin.  Anesthesia: General.  Specimen: None.  Drains: 16 [place] council Foley catheter.  EBL: None.  Complications: None.  Indications: The patient is an 89 year old male with a history of urethral cancer who had previously undergone partial ureterectomy with perineal urethra ostomy.  He is having progressive voiding difficulty and examination in the office demonstrated a severe stricture of the perineal urethrostomy.  Procedure: He was taken the operating room was given antibiotics.  A general anesthetic was induced.  He was placed in lithotomy position and fitted with PAS hose.  His perineum and genitalia were prepped with Betadine solution he was draped in the usual sterile fashion.  An initial attempt to pass a 6.5 French semirigid ureteroscope through the perineal urethrostomy was not successful due to the severity of the stenosis.  I was then able to pass a sensor wire into the bladder under fluoroscopic guidance.  I was still unable to pass the ureteroscope even with the wire in place.  I then used a 15 cm x 24 French high-pressure balloon to balloon dilate the stricture to 20 atm under fluoroscopic guidance.  Once the urethra been dilated I was able to pass the 21 French cystoscope over the wire.  Inspection demonstrated a short segment stricture that was primarily involving the meatus of the perineal urethra ostomy.  The short segment of residual bulbar urethra was normal in appearance.  The external sphincter was intact.  The prostatic urethra was approximately 2 cm in length with lateral lobe hyperplasia with some coaptation.  Inspection of the bladder demonstrated moderate trabeculation with a few diverticuli but no mucosal lesions.  Ureteral orifices were unremarkable.  A 3 cm  x 30 French Optilume balloon was then passed over the wire into the cystoscope sheath where it was allowed to hydrate for 1 minute.  It was then passed across the strictured segment and balloon dilated at 10 atm under fluoroscopic guidance and left inflated for 5 minutes.  The balloon was deflated and the scope was removed along with the balloon leaving the wire in place.  A 16 [place] council Foley catheter was passed over the wire into the bladder.  The balloon was filled with 10 mL of sterile fluid and the catheter was placed to straight drainage.  He was taken down from lithotomy position, his anesthetic was reversed and he was moved to recovery in stable condition.  There were no complications.

## 2023-04-07 NOTE — Discharge Instructions (Addendum)
CYSTOSCOPY HOME CARE INSTRUCTIONS  Activity: Rest for the remainder of the day.  Do not drive or operate equipment today.  You may resume normal activities in one to two days as instructed by your physician.   Meals: Drink plenty of liquids and eat light foods such as gelatin or soup this evening.  You may return to a normal meal plan tomorrow.  Return to Work: You may return to work in one to two days or as instructed by your physician.  Special Instructions / Symptoms: Call your physician if any of these symptoms occur:   -persistent or heavy bleeding  -bleeding which continues after first few urination  -large blood clots that are difficult to pass  -urine stream diminishes or stops completely  -fever equal to or higher than 101 degrees Farenheit.  -cloudy urine with a strong, foul odor  -severe pain  You may remove the catheter on Monday morning.   Patient Signature:  ________________________________________________________  Nurse's Signature:  ________________________________________________________ Post Anesthesia Home Care Instructions  Activity: Get plenty of rest for the remainder of the day. A responsible adult should stay with you for 24 hours following the procedure.  For the next 24 hours, DO NOT: -Drive a car -Advertising copywriter -Drink alcoholic beverages -Take any medication unless instructed by your physician -Make any legal decisions or sign important papers.  Meals: Start with liquid foods such as gelatin or soup. Progress to regular foods as tolerated. Avoid greasy, spicy, heavy foods. If nausea and/or vomiting occur, drink only clear liquids until the nausea and/or vomiting subsides. Call your physician if vomiting continues.  Special Instructions/Symptoms: Your throat may feel dry or sore from the anesthesia or the breathing tube placed in your throat during surgery. If this causes discomfort, gargle with warm salt water. The discomfort should disappear  within 24 hours.

## 2023-04-11 ENCOUNTER — Encounter (HOSPITAL_BASED_OUTPATIENT_CLINIC_OR_DEPARTMENT_OTHER): Payer: Self-pay | Admitting: Urology

## 2023-04-12 ENCOUNTER — Telehealth: Payer: Self-pay | Admitting: Family Medicine

## 2023-04-12 NOTE — Telephone Encounter (Signed)
See below

## 2023-04-12 NOTE — Telephone Encounter (Signed)
Called and spoke with pt and below message given. 

## 2023-04-12 NOTE — Telephone Encounter (Signed)
Patient requests to be called to be advised if he should cancel or keep the EKG appointment 04/24/23 since he had an EKG done with Dr. Annabell Howells on 04/07/23. States he is not sure if Medicare will cover 2 EKG's.

## 2023-04-12 NOTE — Telephone Encounter (Signed)
Urology did an EKG/electrocardiogram and his upcoming visit is for an echocardiogram/ultrasound so yes can keep both

## 2023-04-21 DIAGNOSIS — R8271 Bacteriuria: Secondary | ICD-10-CM | POA: Diagnosis not present

## 2023-04-21 DIAGNOSIS — N401 Enlarged prostate with lower urinary tract symptoms: Secondary | ICD-10-CM | POA: Diagnosis not present

## 2023-04-21 DIAGNOSIS — R3912 Poor urinary stream: Secondary | ICD-10-CM | POA: Diagnosis not present

## 2023-04-21 DIAGNOSIS — N3000 Acute cystitis without hematuria: Secondary | ICD-10-CM | POA: Diagnosis not present

## 2023-04-24 ENCOUNTER — Ambulatory Visit (HOSPITAL_COMMUNITY): Payer: Medicare Other | Attending: Cardiology

## 2023-04-24 DIAGNOSIS — I350 Nonrheumatic aortic (valve) stenosis: Secondary | ICD-10-CM | POA: Insufficient documentation

## 2023-04-24 LAB — ECHOCARDIOGRAM COMPLETE
AR max vel: 1.96 cm2
AV Area VTI: 1.99 cm2
AV Area mean vel: 1.84 cm2
AV Mean grad: 36.7 mmHg
AV Peak grad: 66.7 mmHg
Ao pk vel: 4.08 m/s
Area-P 1/2: 2.47 cm2
P 1/2 time: 510 msec
S' Lateral: 2 cm

## 2023-04-25 ENCOUNTER — Other Ambulatory Visit: Payer: Self-pay

## 2023-04-25 ENCOUNTER — Encounter: Payer: Self-pay | Admitting: Family Medicine

## 2023-04-25 DIAGNOSIS — I350 Nonrheumatic aortic (valve) stenosis: Secondary | ICD-10-CM

## 2023-04-25 NOTE — Telephone Encounter (Signed)
Pt spoke with Florentina Addison CMA this morning and was advised Cardiology phone number incase he wasn't contacted in the next week, he can call and schedule an appointment per notes. See below  Provided cardiologist number at [facility] location to call within a week if he is not contacted to schedule.

## 2023-04-26 ENCOUNTER — Other Ambulatory Visit: Payer: Self-pay

## 2023-04-26 DIAGNOSIS — I350 Nonrheumatic aortic (valve) stenosis: Secondary | ICD-10-CM

## 2023-06-19 ENCOUNTER — Encounter (HOSPITAL_BASED_OUTPATIENT_CLINIC_OR_DEPARTMENT_OTHER): Payer: Self-pay | Admitting: Cardiology

## 2023-06-19 ENCOUNTER — Ambulatory Visit (INDEPENDENT_AMBULATORY_CARE_PROVIDER_SITE_OTHER): Payer: Medicare Other | Admitting: Cardiology

## 2023-06-19 VITALS — BP 150/74 | HR 75 | Ht 74.0 in | Wt 272.0 lb

## 2023-06-19 DIAGNOSIS — N184 Chronic kidney disease, stage 4 (severe): Secondary | ICD-10-CM | POA: Diagnosis not present

## 2023-06-19 DIAGNOSIS — E1169 Type 2 diabetes mellitus with other specified complication: Secondary | ICD-10-CM | POA: Diagnosis not present

## 2023-06-19 DIAGNOSIS — I350 Nonrheumatic aortic (valve) stenosis: Secondary | ICD-10-CM

## 2023-06-19 DIAGNOSIS — I10 Essential (primary) hypertension: Secondary | ICD-10-CM

## 2023-06-19 DIAGNOSIS — E785 Hyperlipidemia, unspecified: Secondary | ICD-10-CM | POA: Diagnosis not present

## 2023-06-19 DIAGNOSIS — E1122 Type 2 diabetes mellitus with diabetic chronic kidney disease: Secondary | ICD-10-CM | POA: Diagnosis not present

## 2023-06-19 NOTE — Progress Notes (Signed)
Cardiology Office Note:  .    Date:  06/19/2023  ID:  Charles Sawyer., DOB October 20, 1934, MRN [id number] PCP: Shelva Majestic, MD  [facility] Providers Cardiologist:  Jodelle Red, MD     History of Present Illness: .    Charles Marquan Hagin. is a 89 y.o. male with a hx of moderate to severe aortic stenosis 04/2023, hypertension, hyperlipidemia, diabetes, CKD IV, GERD, urethral carcinoma, here for the evaluation of aortic valve stenosis.  He had an echocardiogram 04/24/2023 revealing LVEF 60-65%, mild LVH, and indeterminate diastolic parameters. There was moderate to severe aortic valve stenosis with mild aortic regurgitation. No evidence of mitral valve regurgitation or stenosis. Borderline dilatation of the ascending aorta measuring 36 mm. Given worsening aortic valve stenosis (mild per echo 06/2019) he was referred to cardiology.  Today, he is accompanied by his wife.  Cardiovascular risk factors: Prior clinical ASCVD:  Aortic stenosis. He affirms having a known heart murmur for years. Denies any heart attack or stroke. Comorbid conditions: Hypertension - He reports average home blood pressures of 148-150/60-70, currently 150/74 in the office. Hyperlipidemia - Has been on medication for this for 20 years. Currently on rosuvastatin 10 mg daily. Diabetes - diagnosed about 15-20 years ago. CKD IV - Also initially diagnosed about 20 years ago. Metabolic syndrome/Obesity:  Highest adult weight is about 275 lbs. Current weight 272 lbs. Chronic inflammatory conditions: None. Tobacco use history:  Former smoker. He quit in 1970. Family history: His father died of CHF at 28-51 years old, long-term, did have a defibrillator for about a year prior to his death. Prior pertinent testing and/or incidental findings: Moderate to severe aortic stenosis per echo 04/2023. Aortic valve stenosis was mild in 06/2019. Exercise level: Most intense physical activity is walking up and down stairs at  least once a day, and walking from the parking lot. He denies any significant shortness of breath as he typically moves slower and doesn't overexert himself. He is cognizant of his fall risk, occasionally he has balance issues. Most recently had fallen due to weakness in the setting of an illness about a year ago, requiring EMS services. He never lost consciousness. Denies any chest pain or heaviness.  He endorses swelling in his feet all the time, left usually worse than right. No prior physical injury. He notes that the level of swelling he has today is typical for him. Follows with [facility] Signe [facility] at [facility]; due for follow-up in October.  He denies any palpitations, headaches, orthopnea, or PND.  ROS:  Please see the history of present illness. ROS otherwise negative except as noted.  (+) Balance issues with remote falls (+) BLE edema, L>R  Studies Reviewed: Marland Kitchen       ECG not ordered today  Physical Exam:    VS:  BP (!) 150/74   Pulse 75   Ht 6\' 2"  (1.88 m)   Wt 272 lb (123.4 kg)   SpO2 99%   BMI 34.92 kg/m    Wt Readings from Last 3 Encounters:  06/19/23 272 lb (123.4 kg)  04/07/23 273 lb 9.6 oz (124.1 kg)  03/28/23 276 lb 6.4 oz (125.4 kg)    GEN: Well nourished, well developed in no acute distress HEENT: Normal, moist mucous membranes NECK: No JVD CARDIAC: regular rhythm, normal S1 and S2, no rubs or gallops. 2-3/6 harsh midpeaking systolic murmur with quiet S2 VASCULAR: Radial and DP pulses 2+ bilaterally. No carotid bruits RESPIRATORY:  Clear to auscultation without  rales, wheezing or rhonchi  ABDOMEN: Soft, non-tender, non-distended MUSCULOSKELETAL:  Ambulates independently SKIN: Warm and dry, 1+ LE edema L>R NEUROLOGIC:  Alert and oriented x 3. No focal neuro deficits noted. PSYCHIATRIC:  Normal affect   ASSESSMENT AND PLAN: .    Aortic stenosis -moderate to severe is read on echo, but does have peak velocity >4 m/s -no chest pain, no syncope -does climb  stairs, has to take them more slowly due to shortness of breath -chronic LE edema L>R -reviewed options, including monitoring symptoms and repeating echo in 6 mos vs. Referral to structural heart team. They would prefer referral to structural team, placed today -reviewed aortic stenosis, TAVR vs SAVR, and workup. Specifically discussed risk of contrast for CT and cath with his CKD stage 4.  CKD stage 4 Type II diabetes -follows with Dr. Signe Colt at [facility] -not on chronic diuretic. Does have LE edema. Defer to Dr. Signe Colt, but with AS may benefit from diuretic, especially if symptoms progress. Could consider SGLT2i given his type II diabetes  Hypertension -continue amlodipine  Hyperlipidemia -continue rosuvastatin  Dispo: Follow-up in 6 months, or sooner as needed.  I,Mathew Stumpf,acting as a Neurosurgeon for [employer], MD.,have documented all relevant documentation on the behalf of Jodelle Red, MD,as directed by  Jodelle Red, MD while in the presence of Jodelle Red, MD.  I, Jodelle Red, MD, have reviewed all documentation for this visit. The documentation on 06/19/23 for the exam, diagnosis, procedures, and orders are all accurate and complete.   Signed, Jodelle Red, MD

## 2023-06-19 NOTE — Patient Instructions (Addendum)
Medication Instructions:  Continue current medications  *If you need a refill on your cardiac medications before your next appointment, please call your pharmacy*   Lab Work: None Ordered   Testing/Procedures: None Ordered   Follow-Up: At [facility], you and your health needs are our priority.  As part of our continuing mission to provide you with exceptional heart care, we have created designated Provider Care Teams.  These Care Teams include your primary Cardiologist (physician) and Advanced Practice Providers (APPs -  Physician Assistants and Nurse Practitioners) who all work together to provide you with the care you need, when you need it.  We recommend signing up for the patient portal called "MyChart".  Sign up information is provided on this After Visit Summary.  MyChart is used to connect with patients for Virtual Visits (Telemedicine).  Patients are able to view lab/test results, encounter notes, upcoming appointments, etc.  Non-urgent messages can be sent to your provider as well.   To learn more about what you can do with MyChart, go to [email or web address].    Your next appointment:   6 month(s)  Provider:   Jodelle Red, MD    Other Instructions You have been referred to [facility]   If you have chest pain, shortness of breath that is worsening, or loss of consciousness, please let us know ASAP.

## 2023-06-20 NOTE — Progress Notes (Unsigned)
Patient ID: Charles Castillo. MRN: [id number] DOB/AGE: 1934-03-24 89 y.o.  Primary Care Physician:Hunter, Aldine Contes, MD Primary Cardiologist: Jodelle Red, MD   FOCUSED CARDIOVASCULAR PROBLEM LIST:   1.  Aortic stenosis with a dimensionless index of 0.33, mean gradient of 36 mmHg and V-max of 4.08 m/s with mild aortic insufficiency; ejection fraction 60 to 65%; ECG sinus rhythm without bundle-branch blocks 2.  Stage IV CKD with GFR in the low 20s 3.  Hypertension 4.  Hyperlipidemia 5.  BMI of 35 6.  Type 2 diabetes mellitus; diet controlled   HISTORY OF PRESENT ILLNESS: The patient is a 89 y.o. male with the indicated medical history here for for recommendations regarding his aortic valvular disease.  The patient had an echocardiogram done by his PCP which noted moderate to severe aortic stenosis with mild aortic insufficiency.  Patient was referred to cardiology.  He is able to do his activities of daily living without significant shortness of breath, chest pain, presyncope or syncope.  The patient is here today with his wife.  On his own admission he is not very active and normal day.  He likes to work on his computer, read the news, work on bills, and do errands.  He does not do a lot of yard work as he lives in a condominium type community with services provided.  He denies any shortness of breath, presyncope, syncope, chest pain, or frank syncope.  For now he is able to do all of his activities of daily living without any issues.  He has required no emergency room visits or hospitalizations.    He is known about his murmur for some time.  He also tells me that his kidney function has been stable for a few years now.    On review of his records his GFR seems to be 19 to 20s for the last 5 years.  He is a remote smoker.  He has been retired for close to 20 years.  He has not seen a dentist in a long time and denies any dental or oral complaints.  Past Medical History:   Diagnosis Date   Anemia associated with chronic renal failure    Aortic stenosis, mild    echo in epic 06-23-2019  MG 19.0 mmHg   Arthritis    BPH (benign prostatic hypertrophy) with urinary obstruction    urologist--- dr Annabell Howells   Chronic gout without tophus    CKD (chronic kidney disease), stage IV [facility])    nephrologist--- dr Signe Colt   Diet-controlled type 2 diabetes mellitus (HCC)    Gastroduodenitis    Hiatal hernia    History of cellulitis    2012-- left lower extremitiy   History of urinary tract part removal    2008--  partial urethrectomy for SCC   Hyperlipidemia    Hypertension    Intraductal papillary mucinous neoplasm    followed  by dr Rhea Belton (GI)   Organic impotence    OSA on CPAP    mild to moderate per study 04-19-2011  per pt uses nightly   Pancreatic cyst    Secondary hyperparathyroidism of renal origin (HCC)    Urethral stricture    Vitamin D deficiency    Wears hearing aid in both ears     Past Surgical History:  Procedure Laterality Date   CATARACT EXTRACTION W/ INTRAOCULAR LENS IMPLANT  2009   CYSTO/  BILATERAL RETROGRADE PYELOGRAM/  RESECTION PROSTATIC URETHRAL TUMOR/  EXCISION BIOPSY URETHRAL MEATUS AND MEATOTOMY/  TRANSRECTAL ULTRASOUND PROSTATE BIOPSY'S  11/25/2006   @[facility]  by dr Annabell Howells   CYSTOSCOPY N/A 06/18/2015   Procedure: Derinda Late;  Surgeon: Bjorn Pippin, MD;  Location[facility];  Service: Urology;  Laterality: N/A;   CYSTOSCOPY WITH BIOPSY  03/29/2007   @[facility]  by dr Annabell Howells;   Cup bx residual papillary lesions prostatic urethra/ bladder and urethra wash and cystologies/  bx distal urethra   CYSTOSCOPY WITH URETHRAL DILATATION  10/13/2016   @ [facility]   CYSTOSCOPY WITH URETHRAL DILATATION N/A 04/07/2023   Procedure: CYSTOSCOPY WITH OPTILUME URETHRAL BALLOON DILATATION;  Surgeon: Bjorn Pippin, MD;  Location[facility];  Service: Urology;  Laterality: N/A;   PELVIC LYMPH NODE DISSECTION  07/06/2017   @ [facility] ;   and  bilatearl inguinal node dissection   PENILE BIOPSY N/A 06/18/2015   Procedure: PENILE BIOPSY;  Surgeon: Bjorn Pippin, MD;  Location: [facility];  Service: Urology;  Laterality: N/A;   TOTAL URETHRECTOMY,RADICAL  07/27/2015   @ [facility]   URETHRECTOMY  05/01/2007   @[facility]  by dr Annabell Howells;   Partial  (squamous cell carcinoma )   URETHROSTOMY CLOSURE  12/10/2015   @ [facility];   Perineal urethrostomy revision    Family History  Problem Relation Age of Onset   Heart failure Father        CHF, no history MI   Stroke Son    Colon cancer Neg Hx    Stomach cancer Neg Hx    Esophageal cancer Neg Hx     Social History   Socioeconomic History   Marital status: Married    Spouse name: Not on file   Number of children: 5   Years of education: Not on file   Highest education level: Associate degree: academic program  Occupational History   Not on file  Tobacco Use   Smoking status: Former    Packs/day: 1.00    Years: 25.00    Additional pack years: 0.00    Total pack years: 25.00    Types: Cigarettes    Quit date: 1970    Years since quitting: 54.5   Smokeless tobacco: Never  Vaping Use   Vaping Use: Never used  Substance and Sexual Activity   Alcohol use: No    Alcohol/week: 0.0 standard drinks of alcohol   Drug use: Never   Sexual activity: Not on file  Other Topics Concern   Not on file  Social History Narrative   Married 1999. 5 kids from previous marriage. 2 step kids. 19 grandkids. 9 greatgrandchildren.   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       Wife-hcpoa and this document details wishes. Patient does not want long term life preserving measures but would want resuscitation and intubation if needed.    Social Determinants of Health   Financial Resource Strain: Low Risk  (03/27/2023)   Overall Financial Resource  Strain (CARDIA)    Difficulty of Paying Living Expenses: Not hard at all  Food Insecurity: No Food Insecurity (03/27/2023)   Hunger Vital Sign    Worried About Running Out of Food in the Last Year: Never true    Ran Out of Food in the Last Year: Never true  Transportation Needs: No Transportation Needs (03/27/2023)   PRAPARE -  Administrator, Civil Service (Medical): No    Lack of Transportation (Non-Medical): No  Physical Activity: Inactive (03/27/2023)   Exercise Vital Sign    Days of Exercise per Week: 0 days    Minutes of Exercise per Session: 0 min  Stress: No Stress Concern Present (03/27/2023)   [employer] of Occupational Health - Occupational Stress Questionnaire    Feeling of Stress : Only a little  Social Connections: Moderately Isolated (03/27/2023)   Social Connection and Isolation Panel [NHANES]    Frequency of Communication with Friends and Family: Twice a week    Frequency of Social Gatherings with Friends and Family: Twice a week    Attends Religious Services: Never    Database administrator or Organizations: No    Attends Banker Meetings: Never    Marital Status: Married  Catering manager Violence: Not At Risk (10/24/2022)   Humiliation, Afraid, Rape, and Kick questionnaire    Fear of Current or Ex-Partner: No    Emotionally Abused: No    Physically Abused: No    Sexually Abused: No     Prior to Admission medications   Medication Sig Start Date End Date Taking? Authorizing Provider  amLODipine (NORVASC) 5 MG tablet TAKE 1 TABLET AT BEDTIME Patient taking differently: Take 5 mg by mouth at bedtime. 01/09/23   Shelva Majestic, MD  Cyanocobalamin (VITAMIN B 12 PO) Take 1 tablet by mouth once a week.    [provider]  Doxylamine Succinate, Sleep, (SLEEP AID PO) Take by mouth at bedtime as needed.    [provider]  pantoprazole (PROTONIX) 40 MG tablet Take 1 tablet (40 mg total) by mouth 2 (two) times daily. Patient  taking differently: Take 40 mg by mouth at bedtime. 03/06/23   Pyrtle, Carie Caddy, MD  rosuvastatin (CRESTOR) 10 MG tablet TAKE 1 TABLET DAILY Patient taking differently: Take 10 mg by mouth at bedtime. 01/17/23   Shelva Majestic, MD  tamsulosin (FLOMAX) 0.4 MG CAPS capsule TAKE 2 CAPSULES DAILY Patient taking differently: Take 0.4 mg by mouth 2 (two) times daily. 01/09/23   Shelva Majestic, MD  VITAMIN D PO Take 1 capsule by mouth daily.    [provider]    No Known Allergies  REVIEW OF SYSTEMS:  General: no fevers/chills/night sweats Eyes: no blurry vision, diplopia, or amaurosis ENT: no sore throat or hearing loss Resp: no cough, wheezing, or hemoptysis CV: no edema or palpitations GI: no abdominal pain, nausea, vomiting, diarrhea, or constipation GU: no dysuria, frequency, or hematuria Skin: no rash Neuro: no headache, numbness, tingling, or weakness of extremities Musculoskeletal: no joint pain or swelling Heme: no bleeding, DVT, or easy bruising Endo: no polydipsia or polyuria  BP 138/60   Pulse 75   Ht 6\' 2"  (1.88 m)   Wt 273 lb 6.4 oz (124 kg)   SpO2 99%   BMI 35.10 kg/m   PHYSICAL EXAM: GEN:  AO x 3 in no acute distress HEENT: normal Dentition: Normal Neck: JVP normal. +2 carotid upstrokes without bruits. No thyromegaly. Lungs: equal expansion, clear bilaterally CV: Apex is discrete and nondisplaced, RRR with 2/6 SEM Abd: soft, non-tender, non-distended; no bruit; positive bowel sounds Ext: no edema, ecchymoses, or cyanosis Vascular: 2+ femoral pulses, 2+ radial pulses       Skin: warm and dry without rash Neuro: CN II-XII grossly intact; motor and sensory grossly intact    DATA AND STUDIES:  EKG: Sinus rhythm  without bundle-branch blocks  2D ECHO: May 2024  1. Left ventricular ejection fraction, by estimation, is 60 to 65%. The  left ventricle has normal function. The left ventricle has no regional  wall motion abnormalities. There is mild  concentric left ventricular  hypertrophy. Left ventricular diastolic  parameters are indeterminate.   2. Right ventricular systolic function is normal. The right ventricular  size is normal.   3. The mitral valve is normal in structure. No evidence of mitral valve  regurgitation. No evidence of mitral stenosis.   4. The aortic valve is normal in structure. Aortic valve regurgitation is  mild. Moderate to severe aortic valve stenosis. Aortic regurgitation PHT  measures 510 msec. Aortic valve area, by VTI measures 1.99 cm. Aortic  valve mean gradient measures 36.7  mmHg. Aortic valve Vmax measures 4.08 m/s.   5. There is borderline dilatation of the ascending aorta, measuring 36  mm.   6. The inferior vena cava is normal in size with greater than 50%  respiratory variability, suggesting right atrial pressure of 3 mmHg.   CARDIAC CATH: n/a  STS RISK CALCULATOR: pending  NHYA CLASS: 1    ASSESSMENT AND PLAN:   Nonrheumatic aortic valve stenosis - Plan: ECHOCARDIOGRAM COMPLETE  Type 2 diabetes mellitus with stage 4 chronic kidney disease, without long-term current use of insulin (HCC)  Hypertension associated with diabetes (HCC)  Hyperlipidemia associated with type 2 diabetes mellitus (HCC)  CKD (chronic kidney disease) stage 4, GFR 15-29 ml/min (HCC)  The patient leads a very sedentary life style.  He does have moderate to severe aortic stenosis.  He is not yet symptomatic from it.  I do long discussion about what to expect with aortic stenosis.  What will be really important in the patient's management is weighing the risk of further kidney dysfunction leading to potential renal replacement therapy.  Of note his creatinine has remained stable over the last several years.  I told him at that point in time we will have to enlist nephrology regarding obtaining a TAVR protocol CTA and coronary angiography study.  I did also tell the patient that it is quite possible that obtaining the  studies or the a potential TAVR procedure in the future itself could promote further renal dysfunction.  I told him I think his symptoms have to be quite lifestyle limiting before we would consider an intervention.  He will be seeing Dr. Cristal Deer in 6 months.  I will see him back in 9 months to stagger these appointments with an echocardiogram on the same day.  I have personally reviewed the patients imaging data as summarized above.  I have reviewed the natural history of aortic stenosis with the patient and family members who are present today. We have discussed the limitations of medical therapy and the poor prognosis associated with symptomatic aortic stenosis. We have also reviewed potential treatment options, including palliative medical therapy, conventional surgical aortic valve replacement, and transcatheter aortic valve replacement. We discussed treatment options in the context of this patient's specific comorbid medical conditions.   All of the patient's questions were answered today. Will make further recommendations based on the results of studies outlined above.   Total time spent with patient today 60 minutes. This includes reviewing records, evaluating the patient and coordinating care.   Orbie Pyo, MD  06/21/2023 2:07 PM    [facility] [address] [place], [place], [place]  [address] Phone: [phone]; Fax: [phone]

## 2023-06-21 ENCOUNTER — Encounter: Payer: Self-pay | Admitting: Internal Medicine

## 2023-06-21 ENCOUNTER — Ambulatory Visit: Payer: Medicare Other | Attending: Internal Medicine | Admitting: Internal Medicine

## 2023-06-21 VITALS — BP 138/60 | HR 75 | Ht 74.0 in | Wt 273.4 lb

## 2023-06-21 DIAGNOSIS — N184 Chronic kidney disease, stage 4 (severe): Secondary | ICD-10-CM | POA: Diagnosis not present

## 2023-06-21 DIAGNOSIS — E785 Hyperlipidemia, unspecified: Secondary | ICD-10-CM | POA: Diagnosis not present

## 2023-06-21 DIAGNOSIS — E1122 Type 2 diabetes mellitus with diabetic chronic kidney disease: Secondary | ICD-10-CM | POA: Diagnosis not present

## 2023-06-21 DIAGNOSIS — I152 Hypertension secondary to endocrine disorders: Secondary | ICD-10-CM

## 2023-06-21 DIAGNOSIS — E1169 Type 2 diabetes mellitus with other specified complication: Secondary | ICD-10-CM | POA: Diagnosis not present

## 2023-06-21 DIAGNOSIS — E1159 Type 2 diabetes mellitus with other circulatory complications: Secondary | ICD-10-CM | POA: Diagnosis not present

## 2023-06-21 DIAGNOSIS — I350 Nonrheumatic aortic (valve) stenosis: Secondary | ICD-10-CM | POA: Insufficient documentation

## 2023-06-21 NOTE — Patient Instructions (Addendum)
Medication Instructions:  No changes *If you need a refill on your cardiac medications before your next appointment, please call your pharmacy*   Lab Work: none   Testing/Procedures:ECHO WILL BE DUE APRIL 2025, BEFORE NEXT VISIT WITH DR. Lynnette Caffey Your physician has requested that you have an echocardiogram. Echocardiography is a painless test that uses sound waves to create images of your heart. It provides your doctor with information about the size and shape of your heart and how well your heart's chambers and valves are working. This procedure takes approximately one hour. There are no restrictions for this procedure. Please do NOT wear cologne, perfume, aftershave, or lotions (deodorant is allowed). Please arrive 15 minutes prior to your appointment time.   Follow-Up: At [facility], you and your health needs are our priority.  As part of our continuing mission to provide you with exceptional heart care, we have created designated Provider Care Teams.  These Care Teams include your primary Cardiologist (physician) and Advanced Practice Providers (APPs -  Physician Assistants and Nurse Practitioners) who all work together to provide you with the care you need, when you need it.   Your next appointment:   9 month(s)  Provider:   Alverda Skeans, MD

## 2023-07-31 ENCOUNTER — Ambulatory Visit: Payer: Medicare Other | Admitting: Physician Assistant

## 2023-07-31 ENCOUNTER — Encounter: Payer: Self-pay | Admitting: Physician Assistant

## 2023-07-31 VITALS — BP 122/60 | HR 70 | Temp 97.3°F | Ht 74.0 in | Wt 275.2 lb

## 2023-07-31 DIAGNOSIS — H6123 Impacted cerumen, bilateral: Secondary | ICD-10-CM | POA: Diagnosis not present

## 2023-07-31 NOTE — Patient Instructions (Signed)
It was great to see you!  May use cotton ball soaked in mineral oil into ear 10-20 min per week if having recurrent ear wax accumulation. May use dilute hydrogen peroxide (equal parts this and water) and place a few drops into ear every 2 weeks to help break down wax buildup.  Follow-up with [place] as needed.  Take care,  Inda Coke PA-C

## 2023-07-31 NOTE — Progress Notes (Signed)
Charles Castillo. is a 89 y.o. male here for a follow up of a pre-existing problem.  History of Present Illness:   Chief Complaint  Patient presents with   Hearing Loss    Pt c/o trouble hearing loss x several weeks    HPI  Hearing loss Patient has been experiencing increased hearing loss over the past few weeks. Has significant wax build-up now and has had this in the past. Would like his ears cleaned. Does not use anything to clean his ears.  He had his hearing aids checked and was told that they are fine    Past Medical History:  Diagnosis Date   Anemia associated with chronic renal failure    Aortic stenosis, mild    echo in epic 06-23-2019  MG 19.0 mmHg   Arthritis    BPH (benign prostatic hypertrophy) with urinary obstruction    urologist--- dr Annabell Howells   Chronic gout without tophus    CKD (chronic kidney disease), stage IV [facility])    nephrologist--- dr Signe Colt   Diet-controlled type 2 diabetes mellitus (HCC)    Gastroduodenitis    Hiatal hernia    History of cellulitis    2012-- left lower extremitiy   History of urinary tract part removal    2008--  partial urethrectomy for SCC   Hyperlipidemia    Hypertension    Intraductal papillary mucinous neoplasm    followed  by dr Rhea Belton (GI)   Organic impotence    OSA on CPAP    mild to moderate per study 04-19-2011  per pt uses nightly   Pancreatic cyst    Secondary hyperparathyroidism of renal origin (HCC)    Urethral stricture    Vitamin D deficiency    Wears hearing aid in both ears      Social History   Tobacco Use   Smoking status: Former    Current packs/day: 0.00    Average packs/day: 1 pack/day for 25.0 years (25.0 ttl pk-yrs)    Types: Cigarettes    Start date: 61    Quit date: 1970    Years since quitting: 54.6   Smokeless tobacco: Never  Vaping Use   Vaping status: Never Used  Substance Use Topics   Alcohol use: No    Alcohol/week: 0.0 standard drinks of alcohol   Drug use: Never     Past Surgical History:  Procedure Laterality Date   CATARACT EXTRACTION W/ INTRAOCULAR LENS IMPLANT  2009   CYSTO/  BILATERAL RETROGRADE PYELOGRAM/  RESECTION PROSTATIC URETHRAL TUMOR/  EXCISION BIOPSY URETHRAL MEATUS AND MEATOTOMY/  TRANSRECTAL ULTRASOUND PROSTATE BIOPSY'S  11/25/2006   @[facility]  by dr Annabell Howells   CYSTOSCOPY N/A 06/18/2015   Procedure: Derinda Late;  Surgeon: Bjorn Pippin, MD;  Location: [facility];  Service: Urology;  Laterality: N/A;   CYSTOSCOPY WITH BIOPSY  03/29/2007   @[facility]  by dr Annabell Howells;   Cup bx residual papillary lesions prostatic urethra/ bladder and urethra wash and cystologies/  bx distal urethra   CYSTOSCOPY WITH URETHRAL DILATATION  10/13/2016   @ [facility]   CYSTOSCOPY WITH URETHRAL DILATATION N/A 04/07/2023   Procedure: CYSTOSCOPY WITH OPTILUME URETHRAL BALLOON DILATATION;  Surgeon: Bjorn Pippin, MD;  Location: [facility];  Service: Urology;  Laterality: N/A;   PELVIC LYMPH NODE DISSECTION  07/06/2017   @ [facility] ;   and bilatearl inguinal node dissection   PENILE BIOPSY N/A 06/18/2015   Procedure: PENILE BIOPSY;  Surgeon: Bjorn Pippin, MD;  Location: [facility];  Service: Urology;  Laterality: N/A;   TOTAL URETHRECTOMY,RADICAL  07/27/2015   @ [facility]   URETHRECTOMY  05/01/2007   @[facility]  by dr Annabell Howells;   Partial  (squamous cell carcinoma )   URETHROSTOMY CLOSURE  12/10/2015   @ [facility];   Perineal urethrostomy revision    Family History  Problem Relation Age of Onset   Heart failure Father        CHF, no history MI   Stroke Son    Colon cancer Neg Hx    Stomach cancer Neg Hx    Esophageal cancer Neg Hx     No Known Allergies  Current Medications:   Current Outpatient Medications:    amLODipine (NORVASC) 5 MG tablet, TAKE 1 TABLET AT BEDTIME (Patient taking differently: Take 5 mg by mouth at bedtime.), Disp: 90 tablet, Rfl: 3   Cyanocobalamin (VITAMIN B 12 PO), Take 1 tablet by mouth once a week., Disp: , Rfl:     Doxylamine Succinate, Sleep, (SLEEP AID PO), Take by mouth at bedtime as needed., Disp: , Rfl:    pantoprazole (PROTONIX) 40 MG tablet, Take 1 tablet (40 mg total) by mouth 2 (two) times daily. (Patient taking differently: Take 40 mg by mouth at bedtime.), Disp: 180 tablet, Rfl: 0   rosuvastatin (CRESTOR) 10 MG tablet, TAKE 1 TABLET DAILY (Patient taking differently: Take 10 mg by mouth at bedtime.), Disp: 90 tablet, Rfl: 3   tamsulosin (FLOMAX) 0.4 MG CAPS capsule, TAKE 2 CAPSULES DAILY (Patient taking differently: Take 0.4 mg by mouth 2 (two) times daily.), Disp: 180 capsule, Rfl: 3   VITAMIN D PO, Take 1 capsule by mouth daily., Disp: , Rfl:    Review of Systems:   ROS Negative unless otherwise specified per HPI.  Vitals:   Vitals:   07/31/23 0804  BP: 122/60  Pulse: 70  Temp: (!) 97.3 F (36.3 C)  TempSrc: Temporal  SpO2: 99%  Weight: 275 lb 4 oz (124.9 kg)  Height: 6\' 2"  (1.88 m)     Body mass index is 35.34 kg/m.  Physical Exam:   Physical Exam Vitals and nursing note reviewed.  Constitutional:      Appearance: He is well-developed.  HENT:     Head: Normocephalic.     Right Ear: There is impacted cerumen.     Left Ear: There is impacted cerumen.  Eyes:     Conjunctiva/sclera: Conjunctivae normal.     Pupils: Pupils are equal, round, and reactive to light.  Pulmonary:     Effort: Pulmonary effort is normal.  Musculoskeletal:        General: Normal range of motion.     Cervical back: Normal range of motion.  Skin:    General: Skin is warm and dry.  Neurological:     Mental Status: He is alert and oriented to person, place, and time.  Psychiatric:        Behavior: Behavior normal.        Thought Content: Thought content normal.        Judgment: Judgment normal.    Ceruminosis is noted.  Wax is removed by syringing and manual debridement. Instructions for home care to prevent wax buildup are given.   Assessment and Plan:   Bilateral impacted cerumen No  red flags Tolerated procedure well and endorses improvement in symptoms Recommend home care to prevent wax build-up - placed on AVS No obvious need for antibiotic(s) at this time Follow-up prn  Jarold Motto, PA-C

## 2023-08-14 ENCOUNTER — Other Ambulatory Visit: Payer: Self-pay | Admitting: Family Medicine

## 2023-08-17 DIAGNOSIS — N3000 Acute cystitis without hematuria: Secondary | ICD-10-CM | POA: Diagnosis not present

## 2023-08-22 ENCOUNTER — Encounter: Payer: Self-pay | Admitting: Family Medicine

## 2023-08-22 DIAGNOSIS — Z23 Encounter for immunization: Secondary | ICD-10-CM | POA: Diagnosis not present

## 2023-08-30 DIAGNOSIS — R972 Elevated prostate specific antigen [PSA]: Secondary | ICD-10-CM | POA: Diagnosis not present

## 2023-09-02 DIAGNOSIS — R0689 Other abnormalities of breathing: Secondary | ICD-10-CM | POA: Diagnosis not present

## 2023-09-02 DIAGNOSIS — I469 Cardiac arrest, cause unspecified: Secondary | ICD-10-CM | POA: Diagnosis not present

## 2023-09-02 DIAGNOSIS — R55 Syncope and collapse: Secondary | ICD-10-CM | POA: Diagnosis not present

## 2023-09-02 DIAGNOSIS — R404 Transient alteration of awareness: Secondary | ICD-10-CM | POA: Diagnosis not present

## 2023-09-02 DIAGNOSIS — R000 Tachycardia, unspecified: Secondary | ICD-10-CM | POA: Diagnosis not present

## 2023-09-06 ENCOUNTER — Telehealth: Payer: Self-pay | Admitting: *Deleted

## 2023-09-06 ENCOUNTER — Encounter: Payer: Self-pay | Admitting: Family Medicine

## 2023-09-06 ENCOUNTER — Telehealth: Payer: Self-pay | Admitting: Family Medicine

## 2023-09-06 DIAGNOSIS — D490 Neoplasm of unspecified behavior of digestive system: Secondary | ICD-10-CM

## 2023-09-06 NOTE — Telephone Encounter (Signed)
I just completed this

## 2023-09-06 NOTE — Telephone Encounter (Signed)
Pt passes away last week and [facility] would like to know if Dr Durene Cal will sign death certificate. [phone]. John.

## 2023-09-06 NOTE — Telephone Encounter (Signed)
Thanks for the info  Condolences to his wife and family JMP

## 2023-09-06 NOTE — Telephone Encounter (Signed)
I also called to offer condolences to wife

## 2023-09-06 NOTE — Telephone Encounter (Signed)
I attempted to reach patient to advise him that he is due for repeat MRCP for pancreatic IPMN. Unfortunately, patient's wife tells me that he passed away last week. Per online obituary, patient passed on 19-Sep-2023. Will make health information management team aware so patient's chart can be updated.

## 2023-09-06 NOTE — Telephone Encounter (Signed)
-----   Message from Nurse Deno Etienne sent at 09/23/2022 10:30 AM EDT ----- Pt needs MRI pancreas in 09/2023 for follow up of pancreatic IPMN seen on CT 09/21/22. See office visit note from 09/23/22

## 2023-09-06 NOTE — Telephone Encounter (Signed)
See below

## 2023-09-19 DEATH — deceased

## 2023-10-27 ENCOUNTER — Ambulatory Visit (HOSPITAL_BASED_OUTPATIENT_CLINIC_OR_DEPARTMENT_OTHER): Payer: Medicare Other | Admitting: Cardiology

## 2023-10-31 ENCOUNTER — Ambulatory Visit: Payer: Medicare Other | Admitting: Family Medicine
